# Patient Record
Sex: Male | Born: 1955 | Race: White | Hispanic: No | Smoking: Former smoker
Health system: Southern US, Community
[De-identification: ages and names within clinical notes are randomized; demographics above are authoritative.]

## PROBLEM LIST (undated history)

## (undated) DIAGNOSIS — D649 Anemia, unspecified: Secondary | ICD-10-CM

## (undated) DIAGNOSIS — M1990 Unspecified osteoarthritis, unspecified site: Secondary | ICD-10-CM

## (undated) DIAGNOSIS — R7989 Other specified abnormal findings of blood chemistry: Secondary | ICD-10-CM

## (undated) DIAGNOSIS — M4302 Spondylolysis, cervical region: Secondary | ICD-10-CM

## (undated) DIAGNOSIS — G118 Other hereditary ataxias: Secondary | ICD-10-CM

## (undated) DIAGNOSIS — K219 Gastro-esophageal reflux disease without esophagitis: Secondary | ICD-10-CM

## (undated) HISTORY — DX: Anemia, unspecified: D64.9

## (undated) HISTORY — DX: Spondylolysis, cervical region: M43.02

## (undated) HISTORY — DX: Other specified abnormal findings of blood chemistry: R79.89

## (undated) HISTORY — DX: Unspecified osteoarthritis, unspecified site: M19.90

## (undated) HISTORY — DX: Gastro-esophageal reflux disease without esophagitis: K21.9

## (undated) HISTORY — PX: OTHER SURGICAL HISTORY: SHX169

## (undated) HISTORY — PX: VASECTOMY: SHX75

---

## 2003-12-29 ENCOUNTER — Ambulatory Visit: Payer: Self-pay | Admitting: Anesthesiology

## 2004-01-22 HISTORY — PX: BACK SURGERY: SHX140

## 2004-02-29 ENCOUNTER — Ambulatory Visit: Payer: Self-pay | Admitting: Anesthesiology

## 2004-03-05 ENCOUNTER — Ambulatory Visit: Payer: Self-pay | Admitting: Anesthesiology

## 2004-03-27 ENCOUNTER — Ambulatory Visit: Payer: Self-pay | Admitting: Anesthesiology

## 2004-04-04 ENCOUNTER — Ambulatory Visit: Payer: Self-pay | Admitting: Anesthesiology

## 2004-06-04 ENCOUNTER — Ambulatory Visit: Payer: Self-pay | Admitting: Anesthesiology

## 2004-07-12 ENCOUNTER — Ambulatory Visit: Payer: Self-pay | Admitting: Anesthesiology

## 2004-07-23 ENCOUNTER — Ambulatory Visit (HOSPITAL_COMMUNITY): Admission: RE | Admit: 2004-07-23 | Discharge: 2004-07-23 | Payer: Self-pay | Admitting: Neurosurgery

## 2004-08-14 ENCOUNTER — Ambulatory Visit: Payer: Self-pay | Admitting: Anesthesiology

## 2004-09-12 ENCOUNTER — Encounter: Admission: RE | Admit: 2004-09-12 | Discharge: 2004-09-12 | Payer: Self-pay | Admitting: Neurosurgery

## 2004-09-17 ENCOUNTER — Ambulatory Visit: Payer: Self-pay | Admitting: Anesthesiology

## 2004-10-12 ENCOUNTER — Inpatient Hospital Stay (HOSPITAL_COMMUNITY): Admission: RE | Admit: 2004-10-12 | Discharge: 2004-10-16 | Payer: Self-pay | Admitting: Neurosurgery

## 2004-11-15 ENCOUNTER — Encounter: Admission: RE | Admit: 2004-11-15 | Discharge: 2004-11-15 | Payer: Self-pay | Admitting: Neurosurgery

## 2005-05-15 ENCOUNTER — Encounter: Admission: RE | Admit: 2005-05-15 | Discharge: 2005-05-15 | Payer: Self-pay | Admitting: Neurosurgery

## 2005-07-04 ENCOUNTER — Ambulatory Visit: Payer: Self-pay | Admitting: Unknown Physician Specialty

## 2005-08-20 ENCOUNTER — Ambulatory Visit: Payer: Self-pay | Admitting: Unknown Physician Specialty

## 2005-11-13 ENCOUNTER — Emergency Department: Payer: Self-pay | Admitting: Emergency Medicine

## 2005-12-30 IMAGING — CR DG CERVICAL SPINE 2 OR 3 VIEWS
3 series · 3 of 3 positions shown · non-contrast
Comparison: none

CLINICAL DATA: 48 year old with cervical fusion.
 CERVICAL SPINE, THREE VIEWS SERIES, INCLUDING NEUTRAL LATERAL, FLEXION AND EXTENSION VIEWS:
 Cervical fusion at C6-7 with anterior plate and screws and interbody bone plug.  Normal overall alignment.  No complicating features are demonstrated.  I do not see any evidence for instability or subluxation with flexion and extension.  C1-2 degenerative changes are noted.

[view not recorded (1 of 3)]
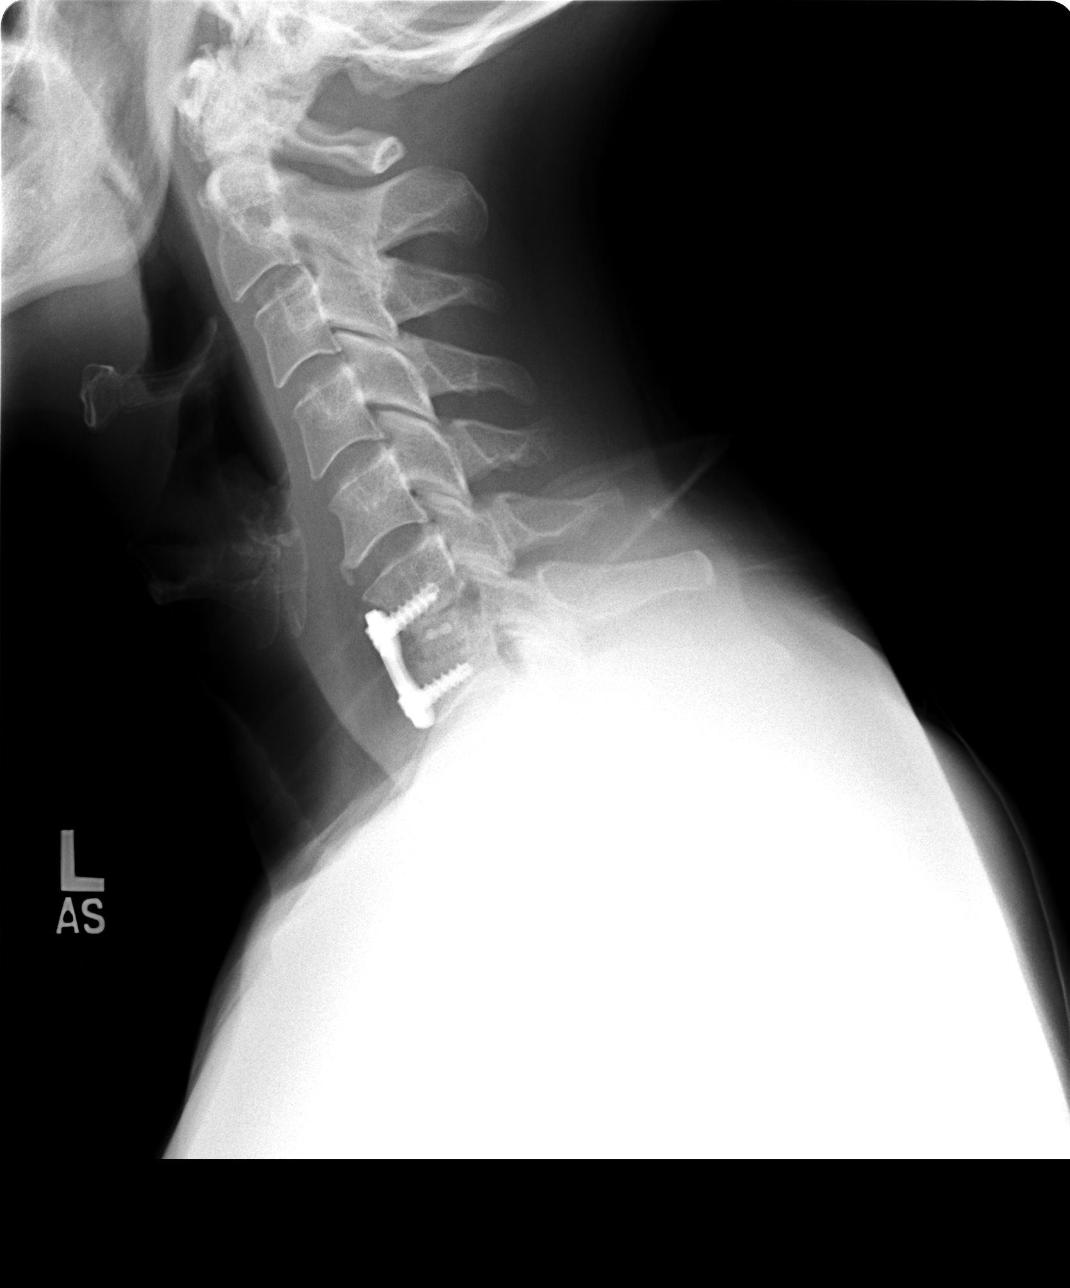

[view not recorded (2 of 3)]
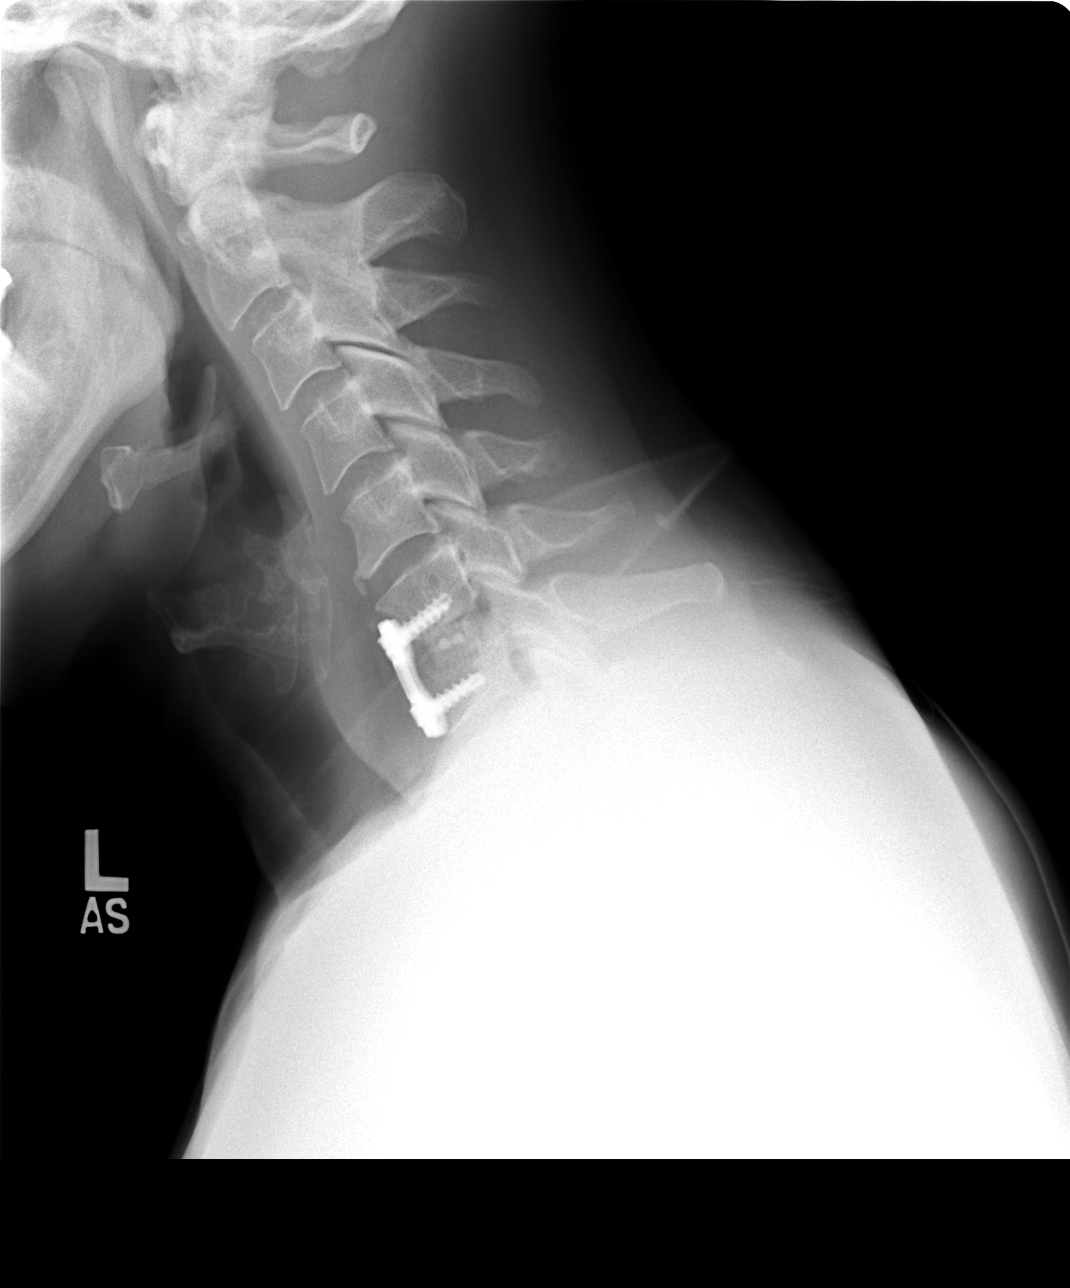

[view not recorded (3 of 3)]
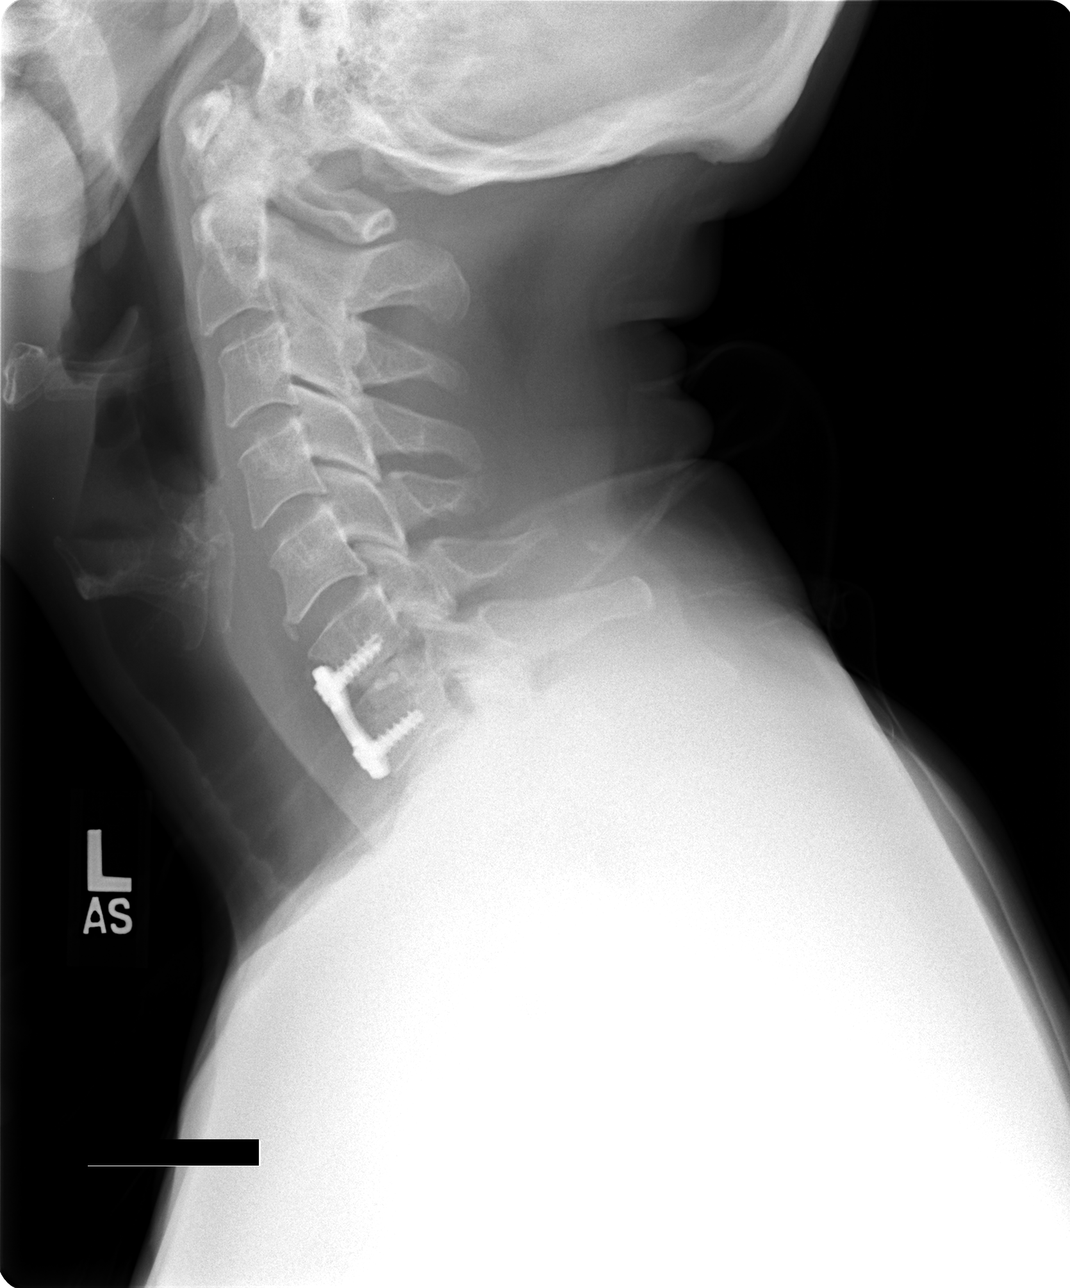

[3 of 3 positions shown; findings below may reference images not displayed]

IMPRESSION: 1.  C6-7 fusion without complicating features.
 2.  Normal alignment and no instability or subluxation with flexion and extension.

## 2006-01-29 IMAGING — RF DG LUMBAR SPINE 2-3V
1 series · 2 of 2 positions shown · non-contrast
Comparison: None.

CLINICAL DATA: L4-S1 fusion. 
 LUMBAR SPINE - 2 VIEW:

[Series 1: run · 2 of 2 slices shown]
[im 1/2]
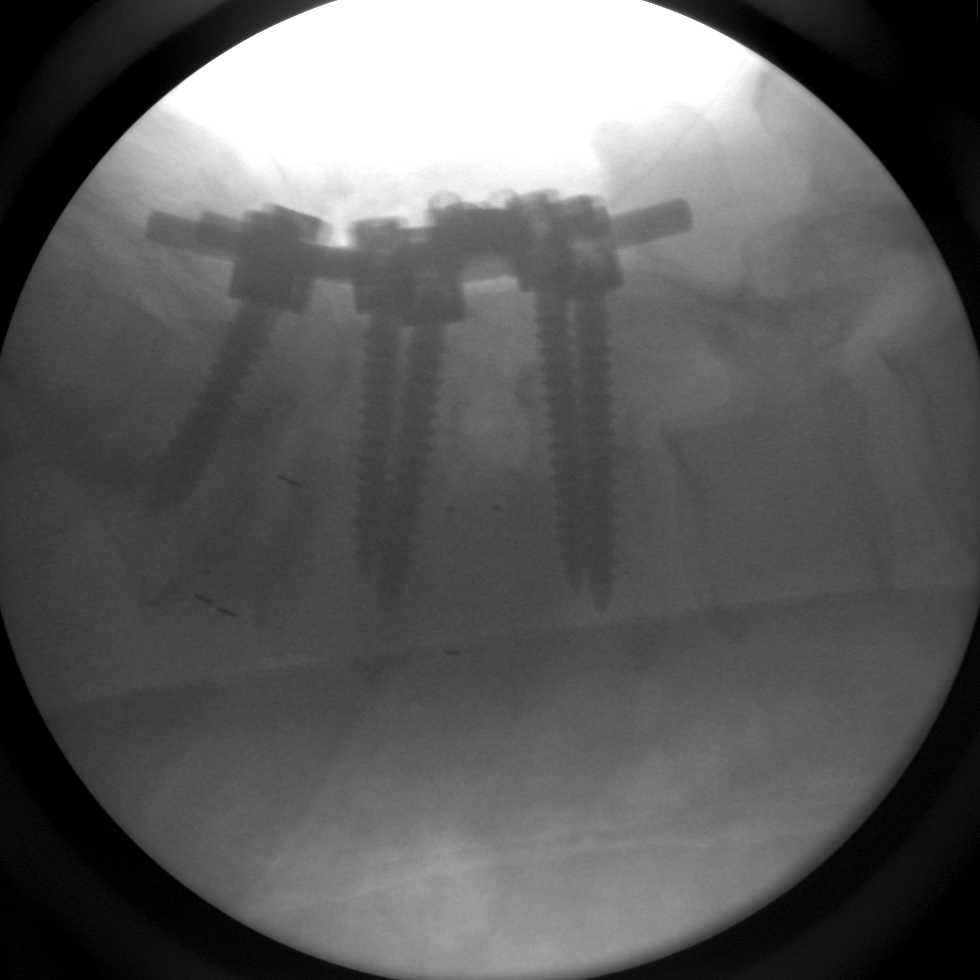
[im 2/2]
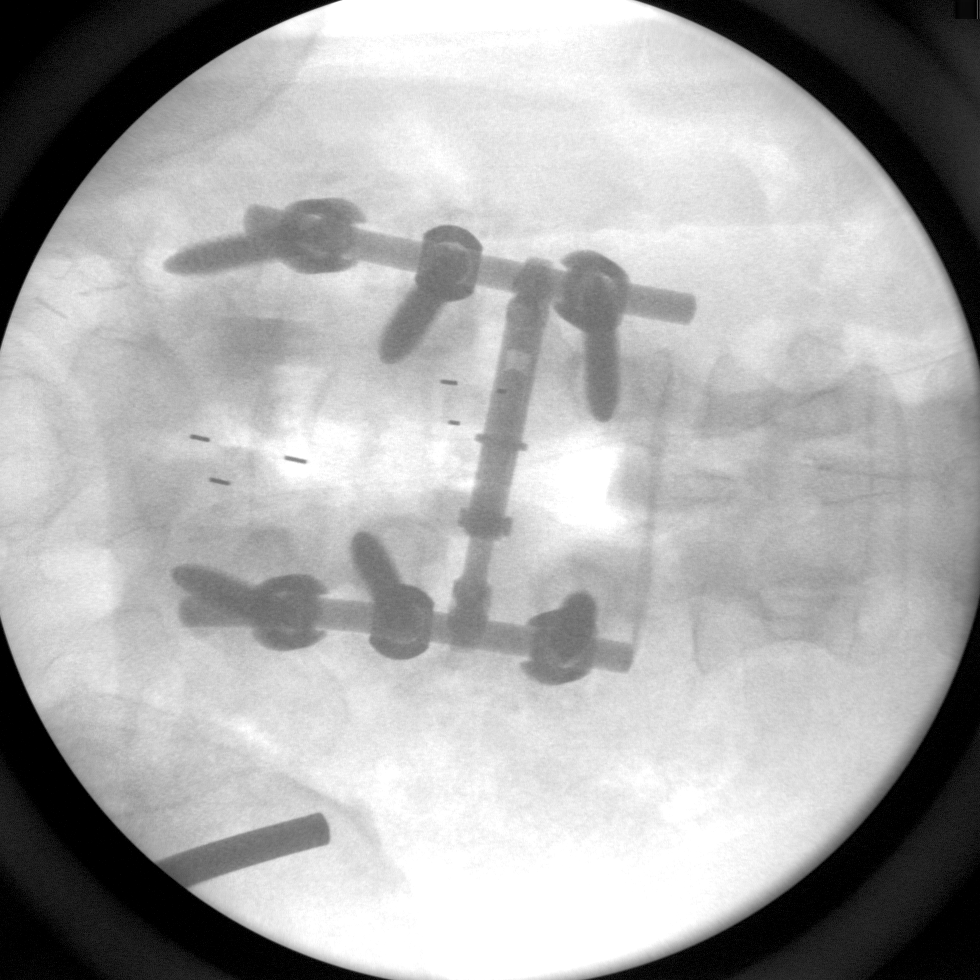

[2 of 2 positions shown; findings below may reference images not displayed]

FINDINGS: Two electronic spot images in the operating room show L4-S1 pedicle screws and rods and rods with intradiscal spacers.  Overall, good position and alignment.
IMPRESSION: Post fusion, L4-S1.

## 2006-03-04 IMAGING — CR DG LUMBAR SPINE 2-3V
2 series · 2 of 2 positions shown · non-contrast
Comparison: none

CLINICAL DATA: Surgery a month ago.  Posterior lumbar spine fusion.  Follow-up.
 LUMBAR SPINE ? TWO VIEWS:
 Two views of the lumbar spine are compared to intraoperative C-Arm spot films from 10/12/04.  Posterior fusion at L4-5 and L5-S1 appears stable.  The interbody fusion plugs are unchanged in position and normal alignment is maintained.  On the frontal view, there is a faint density noted along the right bony pelvis of uncertain significance overlying the right lateral urinary bladder.

[t l-spine a.p.]
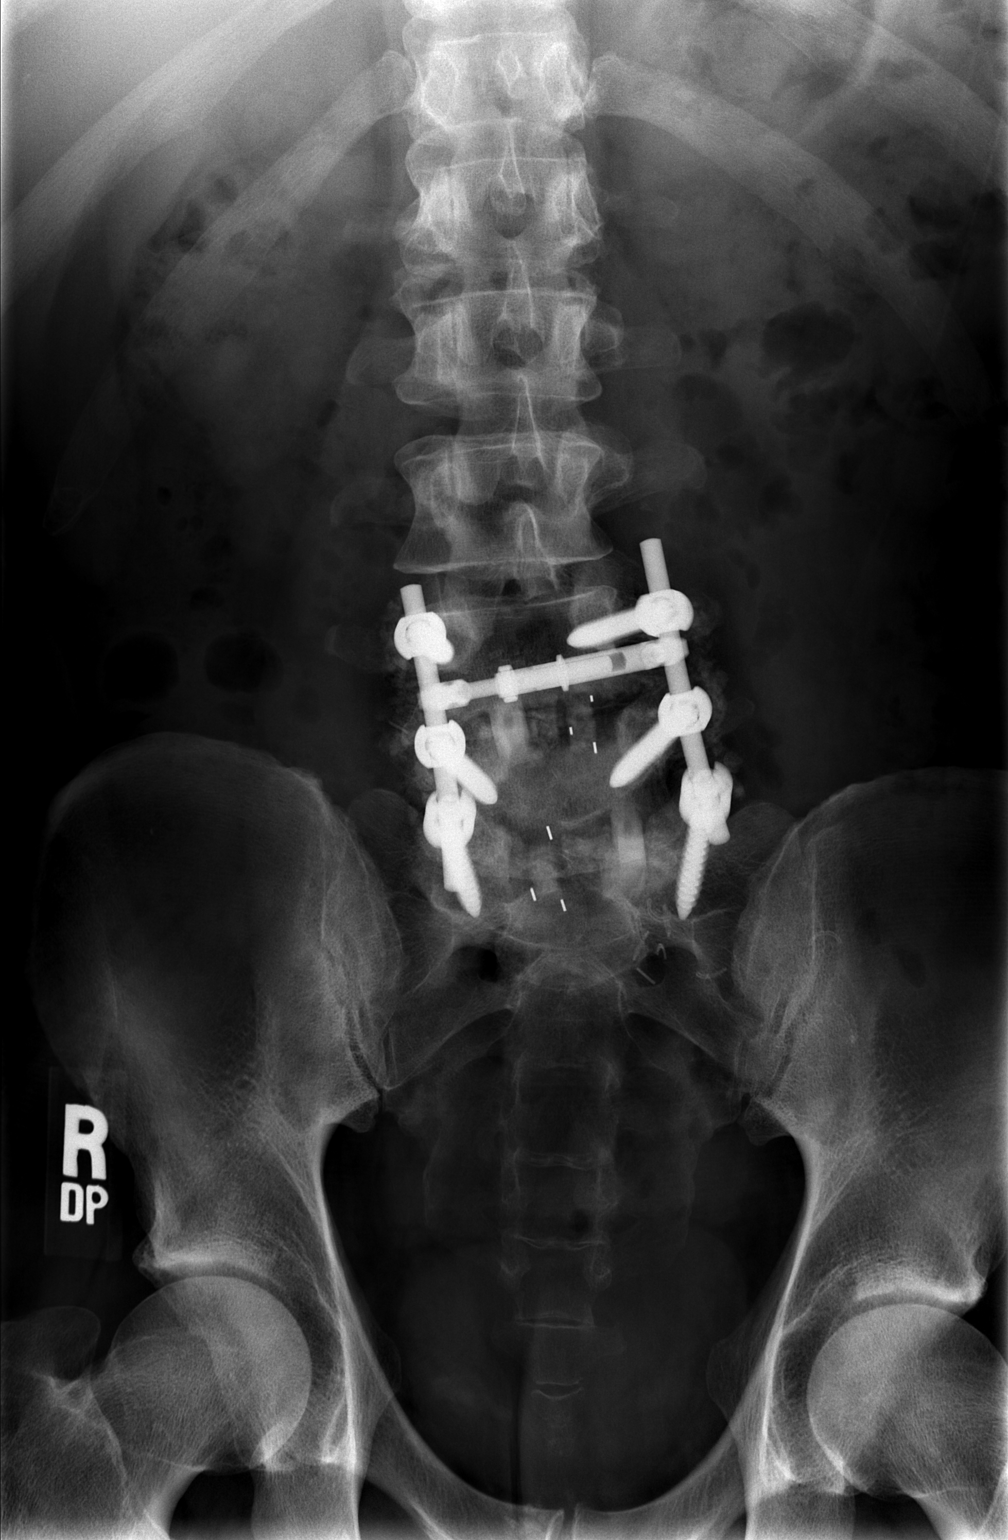

[t l-spine lat]
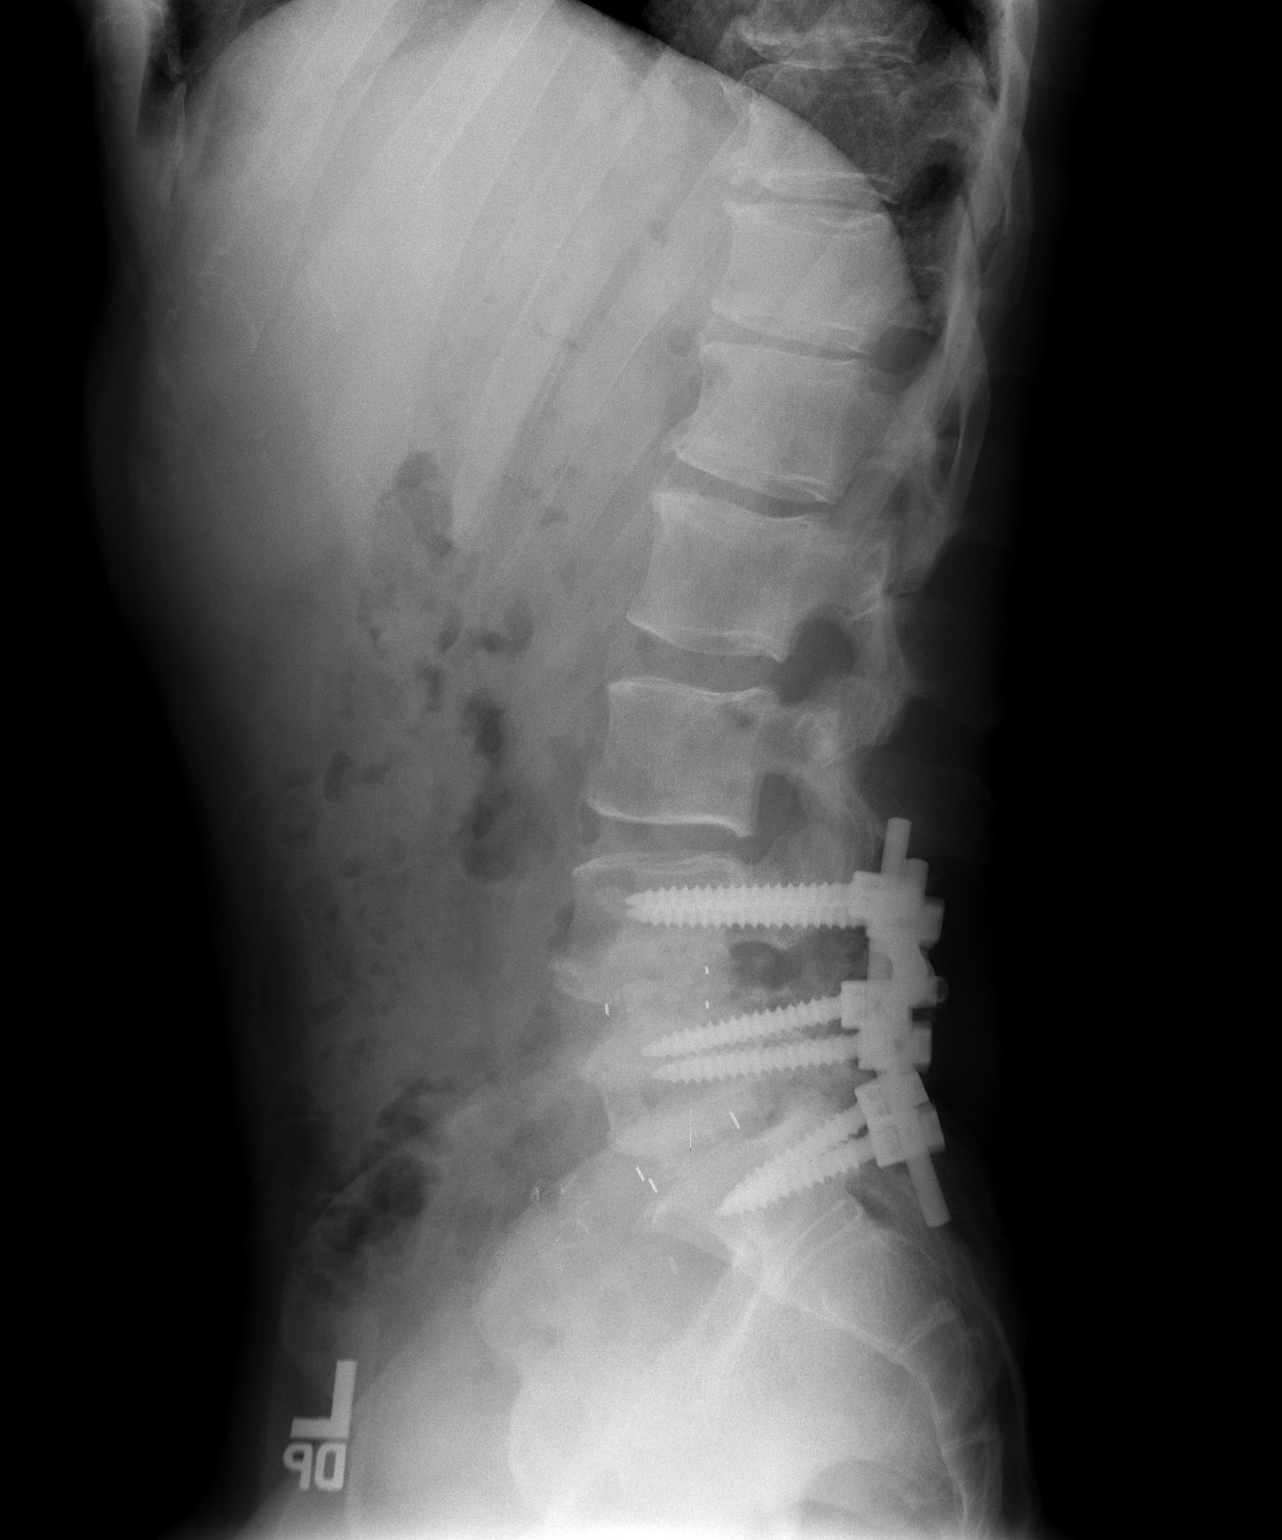

[2 of 2 positions shown; findings below may reference images not displayed]

IMPRESSION: 1.  Stable posterior fusion L4-5 and L5-S1. 
 2.  Faint density low in right bony pelvis of questionable significance.

## 2009-04-11 ENCOUNTER — Ambulatory Visit: Payer: Self-pay | Admitting: Neurosurgery

## 2010-05-10 ENCOUNTER — Ambulatory Visit: Payer: Self-pay | Admitting: Unknown Physician Specialty

## 2012-03-13 ENCOUNTER — Other Ambulatory Visit: Payer: Self-pay | Admitting: Neurosurgery

## 2012-03-13 ENCOUNTER — Ambulatory Visit
Admission: RE | Admit: 2012-03-13 | Discharge: 2012-03-13 | Disposition: A | Discharge status: Home / Self Care | Payer: Medicare Other | Source: Ambulatory Visit | Attending: Neurosurgery | Admitting: Neurosurgery

## 2012-03-13 ENCOUNTER — Other Ambulatory Visit (HOSPITAL_COMMUNITY): Payer: Self-pay | Admitting: Diagnostic Radiology

## 2012-03-13 DIAGNOSIS — M792 Neuralgia and neuritis, unspecified: Secondary | ICD-10-CM

## 2013-01-21 HISTORY — PX: NECK SURGERY: SHX720

## 2013-03-15 ENCOUNTER — Ambulatory Visit: Payer: Medicare Other | Admitting: Adult Health

## 2013-03-18 ENCOUNTER — Ambulatory Visit: Payer: Self-pay | Admitting: Adult Health

## 2013-04-08 ENCOUNTER — Ambulatory Visit: Payer: Self-pay | Admitting: Adult Health

## 2013-08-10 ENCOUNTER — Ambulatory Visit: Payer: Self-pay | Admitting: Adult Health

## 2014-02-08 DIAGNOSIS — M5416 Radiculopathy, lumbar region: Secondary | ICD-10-CM | POA: Diagnosis not present

## 2014-02-08 DIAGNOSIS — R030 Elevated blood-pressure reading, without diagnosis of hypertension: Secondary | ICD-10-CM | POA: Diagnosis not present

## 2014-02-24 DIAGNOSIS — M5137 Other intervertebral disc degeneration, lumbosacral region: Secondary | ICD-10-CM | POA: Diagnosis not present

## 2014-02-24 DIAGNOSIS — M5416 Radiculopathy, lumbar region: Secondary | ICD-10-CM | POA: Diagnosis not present

## 2014-02-24 DIAGNOSIS — R6889 Other general symptoms and signs: Secondary | ICD-10-CM | POA: Diagnosis not present

## 2014-02-24 DIAGNOSIS — M5126 Other intervertebral disc displacement, lumbar region: Secondary | ICD-10-CM | POA: Diagnosis not present

## 2014-03-23 DIAGNOSIS — M5416 Radiculopathy, lumbar region: Secondary | ICD-10-CM | POA: Diagnosis not present

## 2014-03-23 DIAGNOSIS — E291 Testicular hypofunction: Secondary | ICD-10-CM | POA: Diagnosis not present

## 2014-03-23 DIAGNOSIS — R6889 Other general symptoms and signs: Secondary | ICD-10-CM | POA: Diagnosis not present

## 2014-03-25 DIAGNOSIS — F5221 Male erectile disorder: Secondary | ICD-10-CM | POA: Diagnosis not present

## 2014-03-25 DIAGNOSIS — E291 Testicular hypofunction: Secondary | ICD-10-CM | POA: Diagnosis not present

## 2014-03-25 DIAGNOSIS — N400 Enlarged prostate without lower urinary tract symptoms: Secondary | ICD-10-CM | POA: Diagnosis not present

## 2014-07-04 ENCOUNTER — Ambulatory Visit (INDEPENDENT_AMBULATORY_CARE_PROVIDER_SITE_OTHER): Payer: Commercial Managed Care - HMO | Admitting: Family Medicine

## 2014-07-04 ENCOUNTER — Other Ambulatory Visit: Payer: Self-pay | Admitting: Family Medicine

## 2014-07-04 ENCOUNTER — Encounter: Payer: Self-pay | Admitting: Family Medicine

## 2014-07-04 VITALS — BP 100/58 | HR 86 | Resp 15 | Ht 73.0 in | Wt 178.2 lb

## 2014-07-04 DIAGNOSIS — R221 Localized swelling, mass and lump, neck: Secondary | ICD-10-CM | POA: Insufficient documentation

## 2014-07-04 DIAGNOSIS — K219 Gastro-esophageal reflux disease without esophagitis: Secondary | ICD-10-CM | POA: Diagnosis not present

## 2014-07-04 DIAGNOSIS — N529 Male erectile dysfunction, unspecified: Secondary | ICD-10-CM | POA: Insufficient documentation

## 2014-07-04 DIAGNOSIS — E349 Endocrine disorder, unspecified: Secondary | ICD-10-CM | POA: Insufficient documentation

## 2014-07-04 DIAGNOSIS — N400 Enlarged prostate without lower urinary tract symptoms: Secondary | ICD-10-CM | POA: Insufficient documentation

## 2014-07-04 DIAGNOSIS — E291 Testicular hypofunction: Secondary | ICD-10-CM

## 2014-07-04 MED ORDER — ANDROGEL PUMP 20.25 MG/ACT (1.62%) TD GEL: TRANSDERMAL | Status: DC

## 2014-07-04 MED ORDER — RANITIDINE HCL 150 MG PO TABS: 150.0000 mg | ORAL_TABLET | Freq: Two times a day (BID) | ORAL | Status: DC

## 2014-07-04 NOTE — Progress Notes (Signed)
Name: Larry Lin   MRN: [id number]    DOB: 04/25/1955   Date:07/04/2014       Progress Note  Subjective  Chief Complaint  Chief Complaint  Patient presents with  . Mass    Reported from patient in thraot for 8 weeks and has grown in size  . Hypogonadism    HPI  Pt. Is here for follow up of Hypogonadism. Last testosterone level was 465 in March 2016. Pt. continues on Andro gel 81mg  daily (4 pump actuations). Reports improvements in muscle mass, weight gain, endurance, and fatigue. No side effects reported. Last PSA was 0.1 in November 2015. Pt. also has a slowly enlarging, hard, painless, left side mass in his neck. First noticed 8 weeks ago.   Past Medical History  Diagnosis Date  . GERD (gastroesophageal reflux disease)     Past Surgical History  Procedure Laterality Date  . Back surgery  2006  . Neck surgery  2015    History reviewed. No pertinent family history.  History   Social History  . Marital Status: Divorced    Spouse Name: N/A  . Number of Children: N/A  . Years of Education: N/A   Occupational History  . Not on file.   Social History Main Topics  . Smoking status: Former Smoker    Quit date: 01/22/1987  . Smokeless tobacco: Never Used  . Alcohol Use: Not on file  . Drug Use: No  . Sexual Activity: Not on file   Other Topics Concern  . Not on file   Social History Narrative  . No narrative on file     Current outpatient prescriptions:  .  ascorbic acid (VITAMIN C) 100 MG tablet, Take 1 tablet by mouth daily., Disp: , Rfl:  .  Cyanocobalamin 100 MCG LOZG, Take 1 tablet by mouth daily., Disp: , Rfl:  .  HYDROcodone-acetaminophen (NORCO) 10-325 MG per tablet, Take 1 tablet by mouth 4 (four) times daily., Disp: , Rfl:  .  ranitidine (ZANTAC) 150 MG tablet, Take 1 tablet by mouth daily., Disp: , Rfl:  .  tadalafil (CIALIS) 5 MG tablet, Take 1 tablet by mouth as needed., Disp: , Rfl:  .  Testosterone (ANDROGEL PUMP) 20.25 MG/ACT (1.62%) GEL,  Place onto the skin., Disp: , Rfl:   Not on File   Review of Systems  Constitutional: Positive for malaise/fatigue. Negative for fever, chills and weight loss.  HENT: Negative for sore throat.   Eyes: Positive for blurred vision (vision coming in and out of focus especially when reading).  Respiratory: Negative for cough and hemoptysis.   Cardiovascular: Negative for chest pain.  Genitourinary: Negative for dysuria, urgency, frequency, hematuria and flank pain.      Objective  Filed Vitals:   07/04/14 1426  BP: 100/58  Pulse: 86  Resp: 15  Height: 6\' 1"  (1.854 m)  Weight: 178 lb 3.2 oz (80.831 kg)  SpO2: 96%    Physical Exam  Constitutional: He is oriented to person, place, and time and well-developed, well-nourished, and in no distress.  HENT:  Head: Normocephalic and atraumatic.  Neck: Neck supple.    Solid mass palpated over the left anterior neck area, mildly painful.  Cardiovascular: Normal rate and regular rhythm.   Pulmonary/Chest: Effort normal and breath sounds normal.  Neurological: He is alert and oriented to person, place, and time.  Nursing note and vitals reviewed.      No results found for this or any previous visit (from the past 2160  hour(s)).   Assessment & Plan 1. Localized swelling, mass and lump, neck We will obtain an ultrasound of the neck for evaluation of this mass. - CBC w/Diff - US Soft Tissue Head/Neck; Future  2. Testosterone deficiency  - PSA - Testosterone - ANDROGEL PUMP 20.25 MG/ACT (1.62%) GEL; 20.25mg /act. Apply two pumps to each shoulder qAM.  Dispense: 75 g; Refill: 0  3. Gastroesophageal reflux disease without esophagitis  - ranitidine (ZANTAC) 150 MG tablet; Take 1 tablet (150 mg total) by mouth 2 (two) times daily.  Dispense: 60 tablet; Refill: 5  There are no diagnoses linked to this encounter.  Brinley Treanor Asad [facility] 07/04/2014 3:04 PM

## 2014-07-05 ENCOUNTER — Other Ambulatory Visit: Payer: Self-pay | Admitting: Family Medicine

## 2014-07-05 DIAGNOSIS — E349 Endocrine disorder, unspecified: Secondary | ICD-10-CM

## 2014-07-05 MED ORDER — TESTOSTERONE 20.25 MG/1.25GM (1.62%) TD GEL: TRANSDERMAL | Status: DC

## 2014-07-07 ENCOUNTER — Ambulatory Visit
Admission: RE | Admit: 2014-07-07 | Discharge: 2014-07-07 | Disposition: A | Discharge status: Home / Self Care | Payer: Commercial Managed Care - HMO | Source: Ambulatory Visit | Attending: Family Medicine | Admitting: Family Medicine

## 2014-07-07 DIAGNOSIS — R221 Localized swelling, mass and lump, neck: Secondary | ICD-10-CM | POA: Diagnosis not present

## 2014-07-12 DIAGNOSIS — E291 Testicular hypofunction: Secondary | ICD-10-CM | POA: Diagnosis not present

## 2014-07-12 DIAGNOSIS — R221 Localized swelling, mass and lump, neck: Secondary | ICD-10-CM | POA: Diagnosis not present

## 2014-07-13 ENCOUNTER — Telehealth: Payer: Self-pay | Admitting: Family Medicine

## 2014-07-13 DIAGNOSIS — M5441 Lumbago with sciatica, right side: Secondary | ICD-10-CM

## 2014-07-13 DIAGNOSIS — N400 Enlarged prostate without lower urinary tract symptoms: Secondary | ICD-10-CM | POA: Insufficient documentation

## 2014-07-13 LAB — PSA: Prostate Specific Ag, Serum: 0.2 ng/mL (ref 0.0–4.0)

## 2014-07-13 LAB — CBC WITH DIFFERENTIAL/PLATELET
BASOS ABS: 0 10*3/uL (ref 0.0–0.2)
Basos: 0 %
EOS (ABSOLUTE): 0.1 10*3/uL (ref 0.0–0.4)
Eos: 1 %
HEMOGLOBIN: 14.8 g/dL (ref 12.6–17.7)
Hematocrit: 44.9 % (ref 37.5–51.0)
IMMATURE GRANS (ABS): 0 10*3/uL (ref 0.0–0.1)
IMMATURE GRANULOCYTES: 0 %
LYMPHS: 33 %
Lymphocytes Absolute: 2.4 10*3/uL (ref 0.7–3.1)
MCH: 30.5 pg (ref 26.6–33.0)
MCHC: 33 g/dL (ref 31.5–35.7)
MCV: 93 fL (ref 79–97)
MONOCYTES: 9 %
Monocytes Absolute: 0.6 10*3/uL (ref 0.1–0.9)
Neutrophils Absolute: 4.3 10*3/uL (ref 1.4–7.0)
Neutrophils: 57 %
Platelets: 209 10*3/uL (ref 150–379)
RBC: 4.85 x10E6/uL (ref 4.14–5.80)
RDW: 14 % (ref 12.3–15.4)
WBC: 7.4 10*3/uL (ref 3.4–10.8)

## 2014-07-13 LAB — TESTOSTERONE: Testosterone: 598 ng/dL (ref 348–1197)

## 2014-07-13 NOTE — Telephone Encounter (Signed)
ERRENOUS °

## 2014-07-13 NOTE — Telephone Encounter (Signed)
Here is a patient request for referral

## 2014-07-13 NOTE — Telephone Encounter (Signed)
Requesting a Urology referral appointment to be sent to Dr. Lelon Perla in [place]. Pt currently has [facility]. Please call when complete. [phone]

## 2014-07-19 NOTE — Progress Notes (Signed)
Called Pt, LM of normal

## 2014-07-19 NOTE — Telephone Encounter (Signed)
Here's a referral.

## 2014-09-16 DIAGNOSIS — M47812 Spondylosis without myelopathy or radiculopathy, cervical region: Secondary | ICD-10-CM | POA: Diagnosis not present

## 2014-10-04 ENCOUNTER — Ambulatory Visit: Payer: Commercial Managed Care - HMO | Admitting: Family Medicine

## 2014-10-07 ENCOUNTER — Ambulatory Visit: Payer: Commercial Managed Care - HMO | Admitting: Family Medicine

## 2014-10-19 ENCOUNTER — Telehealth: Payer: Self-pay | Admitting: Family Medicine

## 2014-10-19 ENCOUNTER — Ambulatory Visit: Payer: Commercial Managed Care - HMO | Admitting: Family Medicine

## 2014-10-19 NOTE — Telephone Encounter (Signed)
Routed to Dr. Shah for Referral °

## 2014-10-19 NOTE — Telephone Encounter (Signed)
PT SAID THAT BACK IN June THAT THE NURSE WAS TO SET HIM UP AN APPT FOR ENT AND THIS HAS NOT BEEN DONE YET. PLEASE ADVISE

## 2014-10-21 NOTE — Telephone Encounter (Signed)
Tried to call patient and the number listed on his chart is incorrect. Please have patient call us back to discuss the ENT referral

## 2014-10-24 NOTE — Telephone Encounter (Signed)
Per Dr. Sherryll Burger he has Tried to call patient and the number listed on his chart is incorrect. Please have patient call us back to discuss the ENT referral, i tried to call patient unable to reach him

## 2014-10-25 ENCOUNTER — Telehealth: Payer: Self-pay | Admitting: Family Medicine

## 2014-10-25 NOTE — Telephone Encounter (Signed)
Pt has an appt tomorrow at [facility] ENT Dr Buckner Malta for tomorrow 10/26/14 and he has [facility] and needs a referral to be completed.

## 2014-10-25 NOTE — Telephone Encounter (Signed)
Spoke with patient and verified what is going to be seen for, he stated it was for his mass on his throat. Obtained [employer] authorization.  Auth # [id number] Dr. Marion Downer ICD-10: R22.1 Start - 10/26/2014 Expires - 04/24/2015

## 2014-10-25 NOTE — Telephone Encounter (Signed)
Called patient and had to leave a message for him to call me back, I need to know what he is going to been seen for before I can do a referral to ENT as it needs to be specific.

## 2014-10-26 ENCOUNTER — Ambulatory Visit (INDEPENDENT_AMBULATORY_CARE_PROVIDER_SITE_OTHER): Payer: Commercial Managed Care - HMO | Admitting: Family Medicine

## 2014-10-26 ENCOUNTER — Encounter: Payer: Self-pay | Admitting: Family Medicine

## 2014-10-26 VITALS — BP 129/78 | HR 72 | Temp 98.1°F | Resp 16 | Ht 73.0 in | Wt 177.2 lb

## 2014-10-26 DIAGNOSIS — N528 Other male erectile dysfunction: Secondary | ICD-10-CM | POA: Diagnosis not present

## 2014-10-26 DIAGNOSIS — E739 Lactose intolerance, unspecified: Secondary | ICD-10-CM | POA: Diagnosis not present

## 2014-10-26 DIAGNOSIS — E291 Testicular hypofunction: Secondary | ICD-10-CM

## 2014-10-26 DIAGNOSIS — E349 Endocrine disorder, unspecified: Secondary | ICD-10-CM

## 2014-10-26 DIAGNOSIS — R221 Localized swelling, mass and lump, neck: Secondary | ICD-10-CM | POA: Diagnosis not present

## 2014-10-26 DIAGNOSIS — N400 Enlarged prostate without lower urinary tract symptoms: Secondary | ICD-10-CM | POA: Diagnosis not present

## 2014-10-26 DIAGNOSIS — K9049 Malabsorption due to intolerance, not elsewhere classified: Secondary | ICD-10-CM

## 2014-10-26 DIAGNOSIS — R6889 Other general symptoms and signs: Secondary | ICD-10-CM | POA: Diagnosis not present

## 2014-10-26 DIAGNOSIS — N529 Male erectile dysfunction, unspecified: Secondary | ICD-10-CM

## 2014-10-26 MED ORDER — SILDENAFIL CITRATE 100 MG PO TABS: 50.0000 mg | ORAL_TABLET | Freq: Every day | ORAL | Status: DC | PRN

## 2014-10-26 MED ORDER — ANDROGEL PUMP 20.25 MG/ACT (1.62%) TD GEL: TRANSDERMAL | Status: DC

## 2014-10-26 NOTE — Progress Notes (Signed)
Name: Larry Lin   MRN: [id number]    DOB: 17-Aug-1955   Date:10/26/2014       Progress Note  Subjective  Chief Complaint  Chief Complaint  Patient presents with  . Gastrophageal Reflux  . Erectile Dysfunction    pt here for 3 month follow up and refills  . Benign Prostatic Hypertrophy   HPI  Testosterone deficiency Pt. With follow up of decreased testosterone levels. Currently on Androgel, which seems to be helping. He has good energy level, libido is 'little less than normal'. No side effects noted.  Pt. Also has history of Erectile Dysfunction and BPH and takes Cialis (has not taken recently).  Carbohydrate Intolerance Patient describes symptoms of feeling high in energy after ingesting a sugar which meal followed by a 'crash' later on. Patient has no history of diabetes. He would like to have his sugar level checked  Past Medical History  Diagnosis Date  . GERD (gastroesophageal reflux disease)     Past Surgical History  Procedure Laterality Date  . Back surgery  2006  . Neck surgery  2015    No family history on file.  Social History   Social History  . Marital Status: Divorced    Spouse Name: N/A  . Number of Children: N/A  . Years of Education: N/A   Occupational History  . Not on file.   Social History Main Topics  . Smoking status: Former Smoker    Quit date: 01/22/1987  . Smokeless tobacco: Never Used  . Alcohol Use: Not on file  . Drug Use: No  . Sexual Activity: Not on file   Other Topics Concern  . Not on file   Social History Narrative     Current outpatient prescriptions:  .  ALPRAZolam (XANAX) 0.25 MG tablet, TK 1 T PO BID, Disp: , Rfl: 0 .  ANDROGEL PUMP 20.25 MG/ACT (1.62%) GEL, 20.25mg /act. Apply two pumps to each shoulder qAM., Disp: 75 g, Rfl: 0 .  ascorbic acid (VITAMIN C) 100 MG tablet, Take 1 tablet by mouth daily., Disp: , Rfl:  .  Cyanocobalamin 100 MCG LOZG, Take 1 tablet by mouth daily., Disp: , Rfl:  .   HYDROcodone-acetaminophen (NORCO) 10-325 MG per tablet, Take 1 tablet by mouth 4 (four) times daily., Disp: , Rfl:  .  oxyCODONE-acetaminophen (PERCOCET) 10-325 MG tablet, TK 1 TO 2 TS PO Q 6 H PRN, Disp: , Rfl: 0 .  ranitidine (ZANTAC) 150 MG tablet, Take 1 tablet (150 mg total) by mouth 2 (two) times daily., Disp: 60 tablet, Rfl: 5 .  tadalafil (CIALIS) 5 MG tablet, Take 1 tablet by mouth as needed., Disp: , Rfl:  .  Testosterone (ANDROGEL) 20.25 MG/1.25GM (1.62%) GEL, Apply to pumps to each shoulder every morning, Disp: 150 g, Rfl: 2  No Known Allergies   Review of Systems  Genitourinary: Positive for frequency. Negative for dysuria and urgency.   Objective  Filed Vitals:   10/26/14 1153  BP: 129/78  Pulse: 72  Temp: 98.1 F (36.7 C)  Resp: 16  Height:  (1.854 m)  Weight: 177 lb 3 oz (80.372 kg)  SpO2: 97%    Physical Exam  Constitutional: He is well-developed, well-nourished, and in no distress.  Cardiovascular: Normal rate and regular rhythm.   Pulmonary/Chest: Effort normal and breath sounds normal.  Abdominal: Soft. Bowel sounds are normal.  Nursing note and vitals reviewed.  Assessment & Plan  1. Benign prostatic hypertrophy without urinary obstruction Patient will schedule  a follow-up appointment in 2 weeks for DRE and consideration of pharmacotherapy.  2. ED (erectile dysfunction) of organic origin Patient unable to afford Cialis so will switch to Viagra 50 mg  As needed. - sildenafil (VIAGRA) 100 MG tablet; Take 0.5 tablets (50 mg total) by mouth daily as needed for erectile dysfunction.  Dispense: 6 tablet; Refill: 2  3. Testosterone deficiency Recheck testosterone levels in 3 months. Continue present therapy. - ANDROGEL PUMP 20.25 MG/ACT (1.62%) GEL; 20.25mg /act. Apply two pumps to each shoulder qAM.  Dispense: 75 g; Refill: 0  4. Carbohydrate intolerance  - Comprehensive Metabolic Panel (CMET) - HgB A1c   Larry Lin [facility] 10/26/2014 12:59 PM

## 2014-11-04 ENCOUNTER — Telehealth: Payer: Self-pay | Admitting: Family Medicine

## 2014-11-04 NOTE — Telephone Encounter (Signed)
Pt needs refill on Androgel to be [facility] to [facility] [place] phone # [phone].

## 2014-11-07 NOTE — Telephone Encounter (Signed)
This medication has just been refilled on 10/26/2014 by Dr. Sherryll BurgerShah it is too early for refill on this medication

## 2014-11-09 NOTE — Telephone Encounter (Signed)
Pt would like for you to call him back so he can explain this RX

## 2014-11-10 ENCOUNTER — Telehealth: Payer: Self-pay

## 2014-11-10 DIAGNOSIS — E349 Endocrine disorder, unspecified: Secondary | ICD-10-CM

## 2014-11-10 MED ORDER — TESTOSTERONE 20.25 MG/1.25GM (1.62%) TD GEL: TRANSDERMAL | Status: DC

## 2014-11-10 NOTE — Telephone Encounter (Signed)
Rx for AndroGel is ready for pickup. This is a controlled substance and cannot be called in to an out-of-state pharmacy.

## 2014-11-10 NOTE — Telephone Encounter (Signed)
Patient called wanting a new rx to be submitted for his Androgel. He stated that it was written for 75g and he it needed to be 150mg  in order for his insurance to cover it. I informed him that I will consult with Dr. Sherryll BurgerShah and that we will take it from there. He stated that he needed [facility] to [facility] [address] [place], [place] Their phone is [phone] .

## 2014-11-14 ENCOUNTER — Telehealth: Payer: Self-pay

## 2014-11-14 NOTE — Telephone Encounter (Signed)
Tried to contact this patient to inform him that we could not call in controlled substance and that he would have to pick this rx up, but there was no answer. A message was left for him to give us a call when he got the chance.

## 2014-11-14 NOTE — Telephone Encounter (Signed)
Patient called stating that his pharmacy said it was ok for us to fax his rx to them. The number they gave him is [phone].   rx was faxed and confirmation was received.

## 2014-11-25 DIAGNOSIS — E739 Lactose intolerance, unspecified: Secondary | ICD-10-CM | POA: Diagnosis not present

## 2014-11-26 LAB — COMPREHENSIVE METABOLIC PANEL
ALT: 17 IU/L (ref 0–44)
AST: 23 IU/L (ref 0–40)
Albumin/Globulin Ratio: 1.9 (ref 1.1–2.5)
Albumin: 4.9 g/dL (ref 3.5–5.5)
Alkaline Phosphatase: 46 IU/L (ref 39–117)
BILIRUBIN TOTAL: 0.6 mg/dL (ref 0.0–1.2)
BUN/Creatinine Ratio: 17 (ref 9–20)
BUN: 17 mg/dL (ref 6–24)
CO2: 27 mmol/L (ref 18–29)
Calcium: 10.2 mg/dL (ref 8.7–10.2)
Chloride: 102 mmol/L (ref 97–106)
Creatinine, Ser: 1.01 mg/dL (ref 0.76–1.27)
GFR calc Af Amer: 94 mL/min/{1.73_m2} (ref >59)
GFR calc non Af Amer: 82 mL/min/{1.73_m2} (ref >59)
Globulin, Total: 2.6 g/dL (ref 1.5–4.5)
Glucose: 77 mg/dL (ref 65–99)
Potassium: 4.8 mmol/L (ref 3.5–5.2)
Sodium: 145 mmol/L — ABNORMAL HIGH (ref 136–144)
TOTAL PROTEIN: 7.5 g/dL (ref 6.0–8.5)

## 2014-11-26 LAB — HEMOGLOBIN A1C
ESTIMATED AVERAGE GLUCOSE: 111 mg/dL
Hgb A1c MFr Bld: 5.5 % (ref 4.8–5.6)

## 2015-01-11 DIAGNOSIS — R6889 Other general symptoms and signs: Secondary | ICD-10-CM | POA: Diagnosis not present

## 2015-02-03 ENCOUNTER — Other Ambulatory Visit: Payer: Self-pay | Admitting: Family Medicine

## 2015-02-03 NOTE — Telephone Encounter (Signed)
Routed to Dr. Shah for approval 

## 2015-02-03 NOTE — Telephone Encounter (Signed)
Pt needs refill on Androgel and Cialis to be sent to [facility] [place].

## 2015-02-07 ENCOUNTER — Telehealth: Payer: Self-pay

## 2015-02-07 NOTE — Telephone Encounter (Signed)
Returned call and left a voice message. Patient must return to obtain testosterone levels before any further refills can be given. Please schedule for an appointment.

## 2015-02-07 NOTE — Telephone Encounter (Signed)
Patient is requesting refills on Androgel and Cialis he states that he works out of town and is currently traveling now

## 2015-02-14 ENCOUNTER — Telehealth: Payer: Self-pay

## 2015-02-14 DIAGNOSIS — K219 Gastro-esophageal reflux disease without esophagitis: Secondary | ICD-10-CM

## 2015-02-14 MED ORDER — RANITIDINE HCL 150 MG PO TABS: 150.0000 mg | ORAL_TABLET | Freq: Two times a day (BID) | ORAL | Status: DC

## 2015-02-14 NOTE — Telephone Encounter (Signed)
Medication has been refilled and sent to [facility] [address] 

## 2015-03-01 ENCOUNTER — Telehealth: Payer: Self-pay

## 2015-03-01 NOTE — Telephone Encounter (Signed)
Patient called asking I place a referral for him to [facility] with Dr. Julio Sicks. He has an appt on 03/08/15. Authorization obtained Auth# [id number] ICD-10: M 54.41 Start - 03/01/15 Expires- 08/28/15 6 visits

## 2015-03-07 ENCOUNTER — Encounter: Payer: Self-pay | Admitting: Family Medicine

## 2015-03-07 ENCOUNTER — Ambulatory Visit (INDEPENDENT_AMBULATORY_CARE_PROVIDER_SITE_OTHER): Payer: Commercial Managed Care - HMO | Admitting: Family Medicine

## 2015-03-07 ENCOUNTER — Telehealth: Payer: Self-pay

## 2015-03-07 VITALS — BP 124/71 | HR 101 | Temp 98.0°F | Resp 20 | Ht 73.0 in | Wt 175.0 lb

## 2015-03-07 DIAGNOSIS — E349 Endocrine disorder, unspecified: Secondary | ICD-10-CM

## 2015-03-07 DIAGNOSIS — R1031 Right lower quadrant pain: Secondary | ICD-10-CM

## 2015-03-07 DIAGNOSIS — N528 Other male erectile dysfunction: Secondary | ICD-10-CM

## 2015-03-07 DIAGNOSIS — N529 Male erectile dysfunction, unspecified: Secondary | ICD-10-CM

## 2015-03-07 DIAGNOSIS — M5441 Lumbago with sciatica, right side: Secondary | ICD-10-CM | POA: Diagnosis not present

## 2015-03-07 DIAGNOSIS — E291 Testicular hypofunction: Secondary | ICD-10-CM | POA: Diagnosis not present

## 2015-03-07 DIAGNOSIS — K219 Gastro-esophageal reflux disease without esophagitis: Secondary | ICD-10-CM

## 2015-03-07 MED ORDER — ANDROGEL PUMP 20.25 MG/ACT (1.62%) TD GEL: TRANSDERMAL | Status: DC

## 2015-03-07 MED ORDER — TADALAFIL 5 MG PO TABS: 5.0000 mg | ORAL_TABLET | Freq: Every day | ORAL | Status: DC | PRN

## 2015-03-07 MED ORDER — RANITIDINE HCL 150 MG PO TABS: 150.0000 mg | ORAL_TABLET | Freq: Two times a day (BID) | ORAL | Status: DC

## 2015-03-07 NOTE — Telephone Encounter (Signed)
Prescription has been refilled and patient has received prescription at office visit on 03/07/2015

## 2015-03-07 NOTE — Progress Notes (Signed)
Name: Larry Lin   MRN: [id number]    DOB: 1955/07/15   Date:03/07/2015       Progress Note  Subjective  Chief Complaint  Chief Complaint  Patient presents with  . Medication Refill    androgel 20.25 mcg / ranitidine 150 mg / cialis 5 mg    Erectile Dysfunction This is a chronic problem. The nature of his difficulty is maintaining erection. Irritative symptoms do not include urgency. Obstructive symptoms do not include dribbling, incomplete emptying or a slower stream. Pertinent negatives include no hematuria. Past treatments include tadalafil.  Gastroesophageal Reflux He reports no abdominal pain, no belching, no dysphagia, no heartburn, no nausea or no sore throat. This is a chronic problem. The symptoms are aggravated by certain foods (spicy foods, greasy foods.). He has tried a histamine-2 antagonist for the symptoms. History of esophageal stretching.Marland Kitchen    Referral To Neurosurgery Patient is requesting an updated referral to Neurosurgery, sees Dr. Dutch Quint for follow up after having lower back surgery in 2006 for radicular low back pain. He sees Dr. Dutch Quint 3 times a year.  Low testosterone Pt. Requesting refill for Androgel 1.62%. He has history of Low testosterone, last level obtained in June 2016 was within normal range. Will repeat levels today.  Past Medical History  Diagnosis Date  . GERD (gastroesophageal reflux disease)     Past Surgical History  Procedure Laterality Date  . Back surgery  2006  . Neck surgery  2015    History reviewed. No pertinent family history.  Social History   Social History  . Marital Status: Divorced    Spouse Name: N/A  . Number of Children: N/A  . Years of Education: N/A   Occupational History  . Not on file.   Social History Main Topics  . Smoking status: Former Smoker    Quit date: 01/22/1987  . Smokeless tobacco: Never Used  . Alcohol Use: Not on file  . Drug Use: No  . Sexual Activity: Not on file   Other Topics Concern   . Not on file   Social History Narrative     Current outpatient prescriptions:  .  ALPRAZolam (XANAX) 0.25 MG tablet, TK 1 T PO BID, Disp: , Rfl: 0 .  ANDROGEL PUMP 20.25 MG/ACT (1.62%) GEL, 20.25mg /act. Apply two pumps to each shoulder qAM., Disp: 75 g, Rfl: 0 .  ascorbic acid (VITAMIN C) 100 MG tablet, Take 1 tablet by mouth daily., Disp: , Rfl:  .  Cyanocobalamin 100 MCG LOZG, Take 1 tablet by mouth daily., Disp: , Rfl:  .  HYDROcodone-acetaminophen (NORCO) 10-325 MG per tablet, Take 1 tablet by mouth 4 (four) times daily. Reported on 03/07/2015, Disp: , Rfl:  .  oxyCODONE-acetaminophen (PERCOCET) 10-325 MG tablet, TK 1 TO 2 TS PO Q 6 H PRN, Disp: , Rfl: 0 .  ranitidine (ZANTAC) 150 MG tablet, Take 1 tablet (150 mg total) by mouth 2 (two) times daily., Disp: 60 tablet, Rfl: 0 .  tadalafil (CIALIS) 5 MG tablet, Take 1 tablet by mouth as needed., Disp: , Rfl:  .  Testosterone (ANDROGEL) 20.25 MG/1.25GM (1.62%) GEL, Apply two pumps to each shoulder every morning, Disp: 150 g, Rfl: 2 .  sildenafil (VIAGRA) 100 MG tablet, Take 0.5 tablets (50 mg total) by mouth daily as needed for erectile dysfunction. (Patient not taking: Reported on 03/07/2015), Disp: 6 tablet, Rfl: 2  No Known Allergies   Review of Systems  HENT: Negative for sore throat.   Gastrointestinal: Negative for  heartburn, dysphagia, nausea, vomiting and abdominal pain.  Genitourinary: Negative for urgency, hematuria and incomplete emptying.  Musculoskeletal: Positive for back pain.    Objective  Filed Vitals:   03/07/15 1347  BP: 124/71  Pulse: 101  Temp: 98 F (36.7 C)  TempSrc: Oral  Resp: 20  Height:  (1.854 m)  Weight: 175 lb (79.379 kg)  SpO2: 95%    Physical Exam  Constitutional: He is oriented to person, place, and time and well-developed, well-nourished, and in no distress.  Cardiovascular: Normal rate and regular rhythm.   Pulmonary/Chest: Effort normal and breath sounds normal.  Abdominal:  Soft. Bowel sounds are normal. There is tenderness (mild tenderness to palpation in the RLQ to supra-pubic area,).  Neurological: He is alert and oriented to person, place, and time.  Nursing note and vitals reviewed.    Assessment & Plan  1. Gastroesophageal reflux disease without esophagitis Stable on Zantac 150 mg twice a day. - ranitidine (ZANTAC) 150 MG tablet; Take 1 tablet (150 mg total) by mouth 2 (two) times daily.  Dispense: 60 tablet; Refill: 5  2. Testosterone deficiency We will obtain testosterone level and PSA, refill for AndroGel provided. - PSA - Testosterone  3. 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 ED (erectile dysfunction) of organic origin Continue on Cialis 5 mg as needed. - tadalafil (CIALIS) 5 MG tablet; Take 1 tablet (5 mg total) by mouth daily as needed.  Dispense: 10 tablet; Refill: 5  5. RLQ abdominal pain No associated concerning symptoms. Pain is intermittent. Advised to follow-up if pain gets worse or have other concerning symptoms.    Claborn Janusz Asad [facility] 03/07/2015 2:00 PM

## 2015-03-08 DIAGNOSIS — R6889 Other general symptoms and signs: Secondary | ICD-10-CM | POA: Diagnosis not present

## 2015-03-08 DIAGNOSIS — M47812 Spondylosis without myelopathy or radiculopathy, cervical region: Secondary | ICD-10-CM | POA: Diagnosis not present

## 2015-03-13 ENCOUNTER — Other Ambulatory Visit (HOSPITAL_COMMUNITY): Payer: Self-pay | Admitting: Neurosurgery

## 2015-03-13 DIAGNOSIS — M47812 Spondylosis without myelopathy or radiculopathy, cervical region: Secondary | ICD-10-CM

## 2015-04-04 ENCOUNTER — Ambulatory Visit: Payer: Commercial Managed Care - HMO

## 2015-04-21 ENCOUNTER — Ambulatory Visit: Payer: Commercial Managed Care - HMO

## 2015-04-25 ENCOUNTER — Ambulatory Visit
Admission: RE | Admit: 2015-04-25 | Discharge: 2015-04-25 | Disposition: A | Discharge status: Home / Self Care | Payer: Commercial Managed Care - HMO | Source: Ambulatory Visit | Attending: Neurosurgery | Admitting: Neurosurgery

## 2015-04-25 DIAGNOSIS — M50321 Other cervical disc degeneration at C4-C5 level: Secondary | ICD-10-CM | POA: Insufficient documentation

## 2015-04-25 DIAGNOSIS — M50322 Other cervical disc degeneration at C5-C6 level: Secondary | ICD-10-CM | POA: Diagnosis not present

## 2015-04-25 DIAGNOSIS — M47812 Spondylosis without myelopathy or radiculopathy, cervical region: Secondary | ICD-10-CM | POA: Diagnosis not present

## 2015-04-25 DIAGNOSIS — R609 Edema, unspecified: Secondary | ICD-10-CM | POA: Diagnosis not present

## 2015-04-25 DIAGNOSIS — E291 Testicular hypofunction: Secondary | ICD-10-CM | POA: Diagnosis not present

## 2015-04-25 DIAGNOSIS — Z981 Arthrodesis status: Secondary | ICD-10-CM | POA: Diagnosis not present

## 2015-04-26 DIAGNOSIS — M47812 Spondylosis without myelopathy or radiculopathy, cervical region: Secondary | ICD-10-CM | POA: Diagnosis not present

## 2015-04-26 DIAGNOSIS — R6889 Other general symptoms and signs: Secondary | ICD-10-CM | POA: Diagnosis not present

## 2015-04-26 LAB — TESTOSTERONE: Testosterone: 265 ng/dL — ABNORMAL LOW (ref 348–1197)

## 2015-04-26 LAB — PSA: PROSTATE SPECIFIC AG, SERUM: 0.2 ng/mL (ref 0.0–4.0)

## 2015-07-27 ENCOUNTER — Ambulatory Visit (INDEPENDENT_AMBULATORY_CARE_PROVIDER_SITE_OTHER): Payer: Commercial Managed Care - HMO | Admitting: Family Medicine

## 2015-07-27 ENCOUNTER — Encounter: Payer: Self-pay | Admitting: Family Medicine

## 2015-07-27 VITALS — BP 126/72 | HR 87 | Temp 98.7°F | Resp 16 | Ht 73.0 in | Wt 168.6 lb

## 2015-07-27 DIAGNOSIS — F411 Generalized anxiety disorder: Secondary | ICD-10-CM

## 2015-07-27 DIAGNOSIS — E349 Endocrine disorder, unspecified: Secondary | ICD-10-CM

## 2015-07-27 DIAGNOSIS — E291 Testicular hypofunction: Secondary | ICD-10-CM | POA: Diagnosis not present

## 2015-07-27 DIAGNOSIS — F419 Anxiety disorder, unspecified: Secondary | ICD-10-CM | POA: Insufficient documentation

## 2015-07-27 DIAGNOSIS — K219 Gastro-esophageal reflux disease without esophagitis: Secondary | ICD-10-CM

## 2015-07-27 MED ORDER — ALPRAZOLAM 0.25 MG PO TABS: 0.2500 mg | ORAL_TABLET | Freq: Two times a day (BID) | ORAL | Status: DC | PRN

## 2015-07-27 MED ORDER — RANITIDINE HCL 75 MG PO TABS: 75.0000 mg | ORAL_TABLET | Freq: Two times a day (BID) | ORAL | Status: DC

## 2015-07-27 NOTE — Progress Notes (Signed)
Name: Larry Lin   MRN: [id number]    DOB: Jul 09, 1955   Date:07/27/2015       Progress Note  Subjective  Chief Complaint  Chief Complaint  Patient presents with  . Medication Refill    HPI  Hypogonadism: Pt. Presents for medication refill and follow up of Hypogonadism, he is on Testosterone replacement therapy, last level drawn in April 2017 was below the normal range at 265 ng/mL. He is on Androgel 1.62% 4 pumps daily. Since his last visit, he has noticed that as if his prostate may be enlarged, evidenced by having to get up in the middle of the night to urinate (which was not happening before). He denies any urinary retention, dysuria, hematuria, or fever and chills.  Anxiety:  Pt. Presents for refill and follow up of anxiety. He has chronic anxiety due to family issues and it also helps relax his muscles, which decreases his neck pain. He is on Alprazolam 0.25 mg twice daily as needed. Normally, his Urologist was prescribing Alprazolam and he would like to continue receiving the prescription from his PCP instead.    Past Medical History  Diagnosis Date  . GERD (gastroesophageal reflux disease)     Past Surgical History  Procedure Laterality Date  . Back surgery  2006  . Neck surgery  2015    History reviewed. No pertinent family history.  Social History   Social History  . Marital Status: Divorced    Spouse Name: N/A  . Number of Children: N/A  . Years of Education: N/A   Occupational History  . Not on file.   Social History Main Topics  . Smoking status: Former Smoker    Quit date: 01/22/1987  . Smokeless tobacco: Never Used  . Alcohol Use: Not on file  . Drug Use: No  . Sexual Activity: Not on file   Other Topics Concern  . Not on file   Social History Narrative     Current outpatient prescriptions:  .  ALPRAZolam (XANAX) 0.25 MG tablet, TK 1 T PO BID, Disp: , Rfl: 0 .  ANDROGEL PUMP 20.25 MG/ACT (1.62%) GEL, 20.25mg /act. Apply two pumps to  each shoulder qAM., Disp: 150 g, Rfl: 3 .  ascorbic acid (VITAMIN C) 100 MG tablet, Take 1 tablet by mouth daily., Disp: , Rfl:  .  Cyanocobalamin 100 MCG LOZG, Take 1 tablet by mouth daily., Disp: , Rfl:  .  HYDROcodone-acetaminophen (NORCO) 10-325 MG per tablet, Take 1 tablet by mouth 4 (four) times daily. Reported on 03/07/2015, Disp: , Rfl:  .  oxyCODONE-acetaminophen (PERCOCET) 10-325 MG tablet, TK 1 TO 2 TS PO Q 6 H PRN, Disp: , Rfl: 0 .  ranitidine (ZANTAC) 150 MG tablet, Take 1 tablet (150 mg total) by mouth 2 (two) times daily., Disp: 60 tablet, Rfl: 5 .  tadalafil (CIALIS) 5 MG tablet, Take 1 tablet (5 mg total) by mouth daily as needed., Disp: 10 tablet, Rfl: 5 .  Testosterone (ANDROGEL) 20.25 MG/1.25GM (1.62%) GEL, Apply two pumps to each shoulder every morning, Disp: 150 g, Rfl: 2  No Known Allergies   Review of Systems  Gastrointestinal: Positive for heartburn. Negative for nausea, vomiting and abdominal pain.  Genitourinary: Negative for dysuria, frequency and hematuria.    Objective  Filed Vitals:   07/27/15 1524  BP: 126/72  Pulse: 87  Temp: 98.7 F (37.1 C)  TempSrc: Oral  Resp: 16  Height: 6\' 1"  (1.854 m)  Weight: 168 lb 9.6 oz (76.476 kg)  SpO2: 97%    Physical Exam  Constitutional: He is oriented to person, place, and time and well-developed, well-nourished, and in no distress.  Cardiovascular: Normal rate, regular rhythm and normal heart sounds.   No murmur heard. Pulmonary/Chest: Effort normal and breath sounds normal. No respiratory distress. He has no wheezes.  Abdominal: Soft. Bowel sounds are normal. There is no tenderness.  Musculoskeletal: He exhibits no edema.  Neurological: He is alert and oriented to person, place, and time.  Psychiatric: Mood, memory, affect and judgment normal.  Nursing note and vitals reviewed.    Assessment & Plan  1. Gastroesophageal reflux disease without esophagitis Zantac 75 mg twice daily is working just as well  as 150 mg, continue as prescribed - ranitidine (ZANTAC) 75 MG tablet; Take 1 tablet (75 mg total) by mouth 2 (two) times daily.  Dispense: 60 tablet; Refill: 5  2. Testosterone deficiency Referred to urology for follow-up of hypogonadism and to evaluate lower urinary tract symptoms - Ambulatory referral to Urology  3. Generalized anxiety disorder  - ALPRAZolam (XANAX) 0.25 MG tablet; Take 1 tablet (0.25 mg total) by mouth 2 (two) times daily as needed for anxiety.  Dispense: 60 tablet; Refill: 0   Lockie Bothun Asad A. Faylene KurtzShah [facility] 07/27/2015 3:47 PM

## 2015-08-01 ENCOUNTER — Ambulatory Visit: Payer: Commercial Managed Care - HMO | Admitting: Family Medicine

## 2015-08-21 ENCOUNTER — Ambulatory Visit: Payer: Commercial Managed Care - HMO | Admitting: Urology

## 2015-08-28 ENCOUNTER — Ambulatory Visit: Payer: Commercial Managed Care - HMO | Admitting: Urology

## 2015-08-30 ENCOUNTER — Ambulatory Visit: Payer: Commercial Managed Care - HMO | Admitting: Urology

## 2015-09-18 ENCOUNTER — Other Ambulatory Visit: Payer: Self-pay | Admitting: Family Medicine

## 2015-09-18 DIAGNOSIS — F411 Generalized anxiety disorder: Secondary | ICD-10-CM

## 2015-09-28 ENCOUNTER — Encounter: Payer: Self-pay | Admitting: Urology

## 2015-09-28 ENCOUNTER — Ambulatory Visit (INDEPENDENT_AMBULATORY_CARE_PROVIDER_SITE_OTHER): Payer: Commercial Managed Care - HMO | Admitting: Urology

## 2015-09-28 ENCOUNTER — Telehealth: Payer: Self-pay | Admitting: Family Medicine

## 2015-09-28 VITALS — BP 133/79 | HR 85 | Ht 74.0 in | Wt 167.3 lb

## 2015-09-28 DIAGNOSIS — N528 Other male erectile dysfunction: Secondary | ICD-10-CM

## 2015-09-28 DIAGNOSIS — R351 Nocturia: Secondary | ICD-10-CM | POA: Diagnosis not present

## 2015-09-28 DIAGNOSIS — N411 Chronic prostatitis: Secondary | ICD-10-CM | POA: Diagnosis not present

## 2015-09-28 DIAGNOSIS — N529 Male erectile dysfunction, unspecified: Secondary | ICD-10-CM

## 2015-09-28 DIAGNOSIS — E291 Testicular hypofunction: Secondary | ICD-10-CM

## 2015-09-28 LAB — BLADDER SCAN AMB NON-IMAGING: Scan Result: 0

## 2015-09-28 MED ORDER — TADALAFIL 5 MG PO TABS: ORAL_TABLET | ORAL | 12 refills | Status: DC

## 2015-09-28 NOTE — Progress Notes (Signed)
09/28/2015 11:33 AM   [name] 15-May-1955 [id number]  Referring provider: Ellyn Hack, MD [address] [place], [place] [address]  Chief Complaint  Patient presents with  . New Patient (Initial Visit)    low testosterone  referred by Dr. Sherryll Burger    HPI: Patient is a 60 year old Caucasian male who presents today as a referral for hypogonadism and a worsening of his LUTS from his PCP, Dr. Sherryll Burger.  Patient is experiencing a decrease in libido, a lack of energy, a decrease in strength, a decreased enjoyment in life, sadness and/or grumpiness, erections being less strong, a recent deterioration in an ability to play sports, falling asleep after dinner and a recent deterioration in their work performance.  This is indicated by his responses to the ADAM questionnaire.  He is no longer having spontaneous erections at night.  He does not have sleep apnea.   He is currently managing his hypogonadism with AndroGel prescribed by his PCP.  He states it was effective.  He has recently been using the gel at a reduced amount in an effort to extend the medication as he is in the need for a refill.      Androgen Deficiency in the Aging Male    Row Name 09/28/15 1100         Androgen Deficiency in the Aging Male   Do you have a decrease in libido (sex drive) Yes     Do you have lack of energy Yes     Do you have a decrease in strength and/or endurance Yes     Have you lost height No     Have you noticed a decreased "enjoyment of life" Yes     Are you sad and/or grumpy Yes     Are your erections less strong Yes     Have you noticed a recent deterioration in your ability to play sports Yes     Are you falling asleep after dinner Yes     Has there been a recent deterioration in your work performance Yes       His SHIM score is 16, which is mild to moderate ED.  He has been having difficulty with erections for the last few years.  His major complaints are lack of confidence  to achieve an erection, lack of firmness, maintaining an erection to completion and the lack of satisfactory sexual intercourse.  His libido is diminished.   His risk factors for ED are age, hypogonadism and anxiety.  He denies any painful erections or curvatures with his erections.   He has tried Cialis in the past with good results.      SHIM    Row Name 09/28/15 1103         SHIM: Over the last 6 months:   How do you rate your confidence that you could get and keep an erection? Moderate     When you had erections with sexual stimulation, how often were your erections hard enough for penetration (entering your partner)? Sometimes (about half the time)     During sexual intercourse, how often were you able to maintain your erection after you had penetrated (entered) your partner? Slightly Difficult     During sexual intercourse, how difficult was it to maintain your erection to completion of intercourse? Difficult     When you attempted sexual intercourse, how often was it satisfactory for you? Difficult       SHIM  Total Score   SHIM 16        Score: 1-7 Severe ED 8-11 Moderate ED 12-16 Mild-Moderate ED 17-21 Mild ED 22-25 No ED   His IPSS score today is 21, which is severe lower urinary tract symptomatology. He is unhappy with his quality life due to his urinary symptoms.  His PVR is 0 mL.   His major complaints today are a feeling of incomplete emptying, frequency, intermittency, urgency, weak stream, straining and nocturia.  He has had these symptoms for several months.  He denies any dysuria, hematuria or suprapubic pain.   He also denies any recent fevers, chills, nausea or vomiting.  He does not have a family history of PCa.      IPSS    Row Name 09/28/15 1100         International Prostate Symptom Score   How often have you had the sensation of not emptying your bladder? About half the time     How often have you had to urinate less than every two hours? More than half  the time     How often have you found you stopped and started again several times when you urinated? About half the time     How often have you found it difficult to postpone urination? More than half the time     How often have you had a weak urinary stream? About half the time     How often have you had to strain to start urination? About half the time     How many times did you typically get up at night to urinate? 1 Time     Total IPSS Score 21       Quality of Life due to urinary symptoms   If you were to spend the rest of your life with your urinary condition just the way it is now how would you feel about that? Unhappy        Score:  1-7 Mild 8-19 Moderate 20-35 Severe      PMH: Past Medical History:  Diagnosis Date  . Arthritis   . GERD (gastroesophageal reflux disease)   . Low testosterone     Surgical History: Past Surgical History:  Procedure Laterality Date  . BACK SURGERY  2006  . diverticulitis surgery     removed some of lower intestine  . NECK SURGERY  2015  . VASECTOMY     only right side done for nerve pain    Home Medications:    Medication List       Accurate as of 09/28/15 11:33 AM. Always use your most recent med list.          ALPRAZolam 0.25 MG tablet Commonly known as:  XANAX Take 1 tablet (0.25 mg total) by mouth 2 (two) times daily as needed for anxiety.   ascorbic acid 100 MG tablet Commonly known as:  VITAMIN C Take 1 tablet by mouth daily.   Cyanocobalamin 100 MCG Lozg Take 1 tablet by mouth daily.   HYDROcodone-acetaminophen 10-325 MG tablet Commonly known as:  NORCO Take 1 tablet by mouth 4 (four) times daily. Reported on 03/07/2015   oxyCODONE-acetaminophen 10-325 MG tablet Commonly known as:  PERCOCET TK 1 TO 2 TS PO Q 6 H PRN   ranitidine 75 MG tablet Commonly known as:  ZANTAC Take 1 tablet (75 mg total) by mouth 2 (two) times daily.   tadalafil 5 MG tablet Commonly known as:  CIALIS Take 1  tablet (5 mg  total) by mouth daily as needed.   tadalafil 5 MG tablet Commonly known as:  CIALIS Take one a day for prostatitis   Testosterone 20.25 MG/1.25GM (1.62%) Gel Commonly known as:  ANDROGEL Apply two pumps to each shoulder every morning   ANDROGEL PUMP 20.25 MG/ACT (1.62%) Gel Generic drug:  Testosterone 20.25mg /act. Apply two pumps to each shoulder qAM.       Allergies: No Known Allergies  Family History: Family History  Problem Relation Age of Onset  . Kidney disease Neg Hx   . Prostate cancer Neg Hx     Social History:  reports that he quit smoking about 28 years ago. He has never used smokeless tobacco. He reports that he drinks about 0.6 oz of alcohol per week . He reports that he does not use drugs.  ROS: UROLOGY Frequent Urination?: Yes Hard to postpone urination?: Yes Burning/pain with urination?: No Get up at night to urinate?: Yes Leakage of urine?: No Urine stream starts and stops?: Yes Trouble starting stream?: Yes Do you have to strain to urinate?: No Blood in urine?: No Urinary tract infection?: No Sexually transmitted disease?: No Injury to kidneys or bladder?: No Painful intercourse?: No Weak stream?: No Erection problems?: Yes Penile pain?: No  Gastrointestinal Nausea?: Yes Vomiting?: No Indigestion/heartburn?: Yes Diarrhea?: Yes Constipation?: No  Constitutional Fever: No Night sweats?: Yes Weight loss?: Yes Fatigue?: Yes  Skin Skin rash/lesions?: No Itching?: No  Eyes Blurred vision?: Yes Double vision?: No  Ears/Nose/Throat Sore throat?: No Sinus problems?: No  Hematologic/Lymphatic Swollen glands?: No Easy bruising?: No  Cardiovascular Leg swelling?: Yes Chest pain?: No  Respiratory Cough?: No Shortness of breath?: No  Endocrine Excessive thirst?: Yes  Musculoskeletal Back pain?: Yes Joint pain?: Yes  Neurological Headaches?: Yes Dizziness?: Yes  Psychologic Depression?: No Anxiety?: Yes  Physical  Exam: BP 133/79   Pulse 85   Ht 6\' 2"  (1.88 m)   Wt 167 lb 4.8 oz (75.9 kg)   BMI 21.48 kg/m   Constitutional: Well nourished. Alert and oriented, No acute distress. HEENT: [place] AT, moist mucus membranes. Trachea midline, no masses. Cardiovascular: No clubbing, cyanosis, or edema. Respiratory: Normal respiratory effort, no increased work of breathing. GI: Abdomen is soft, non tender, non distended, no abdominal masses. Liver and spleen not palpable.  No hernias appreciated.  Stool sample for occult testing is not indicated.   GU: No CVA tenderness.  No bladder fullness or masses.  Patient with circumcised phallus.  Urethral meatus is patent.  No penile discharge. No penile lesions or rashes. Scrotum without lesions, cysts, rashes and/or edema.  Testicles are located scrotally bilaterally. No masses are appreciated in the testicles. Left and right epididymis are normal. Rectal: Patient with  normal sphincter tone. Anus and perineum without scarring or rashes. No rectal masses are appreciated. Prostate is approximately 35 grams, tender (pain out of proportion to exam), no nodules are appreciated. Seminal vesicles are normal. Skin: No rashes, bruises or suspicious lesions. Lymph: No cervical or inguinal adenopathy. Neurologic: Grossly intact, no focal deficits, moving all 4 extremities. Psychiatric: Normal mood and affect.  Laboratory Data: Lab Results  Component Value Date   WBC 7.4 07/12/2014   HCT 44.9 07/12/2014   MCV 93 07/12/2014   PLT 209 07/12/2014    Lab Results  Component Value Date   CREATININE 1.01 11/25/2014    Lab Results  Component Value Date   TESTOSTERONE 265 (L) 04/25/2015    Lab Results  Component Value  Date   HGBA1C 5.5 11/25/2014    Lab Results  Component Value Date   AST 23 11/25/2014   Lab Results  Component Value Date   ALT 17 11/25/2014   PSA    0.2 mg/mL on 04/25/2015   Assessment & Plan:    1. Hypogonadism:     - currently managed with  AndroGel with Dr. Sherryll BurgerShah  - defer treatment to Dr. Sherryll BurgerShah unless he would like us to manage the hypogonadism  2. Erectile dysfunction  - SHIM score is 16   - currently managed with on demand Cialis with Dr. Sherryll BurgerShah  - initiating Cialis 5 mg daily for prostatitis/LUTS at this time  3. LUTS/Prostatitis  - IPSS score is 21/5  - Continue conservative management, avoiding bladder irritants and timed voiding's  - Discussed PT for pelvic relaxation - patient is interested and will make the referral  - Patient would like to try Cialis daily as he had read it helps with BPH - explained that the prostate is not enlarged but may help with his symptoms - doubtful concerning insurance coverage  - Script send for Cialis 5 mg daily to pharmacy  - RTC in 1 month for IPSS and symptom recheck  4. Nocturia  - most bothersome symptom even though it is once nightly  - I explained to the patient that nocturia is often multi-factorial and difficult to treat.  Sleeping disorders, heart conditions and peripheral vascular disease, diabetes,  enlarged prostate or urethral stricture causing bladder outlet obstruction and/or certain medications.  I have suggested that the patient avoid caffeine and alcohol in the evening. He may also benefit from fluid restrictions after 6:00 in the evening and voiding just prior to bedtime.  Research studies have showed that over 84% of patients with sleep apnea reported frequent nighttime urination.   With sleep apnea, oxygen decreases, carbon dioxide increases, the blood become more acidic, the heart rate drops and blood vessels in the lung constrict.  The body is then alerted that something is very wrong. The sleeper must wake enough to reopen the airway. By this time, the heart is racing and experiences a false signal of fluid overload. The heart excretes a hormone-like protein that tells the body to get rid of sodium and water, resulting in nocturia.     Even though he is only getting up  once nightly, this may be the first noted sign of sleep apnea.    The patient may also benefit from a discussion with his primary care physician to see if he has risk factors for sleep apnea or other sleep disturbances and obtaining a sleep study.   Return in about 1 month (around 10/28/2015) for IPSS score.  These notes generated with voice recognition software. I apologize for typographical errors.  Michiel CowboySHANNON Mckaylin Bastien, PA-C  [facility] [address], [address] [place], [place] [address] [phone]

## 2015-09-28 NOTE — Telephone Encounter (Signed)
[employer] authorization has been completed.  Approval # is [id number] effective 09/28/15 for 6 visits.  The approval # has also be placed in the referral order under authorization.

## 2015-10-02 ENCOUNTER — Ambulatory Visit: Payer: Commercial Managed Care - HMO | Admitting: Family Medicine

## 2015-10-03 ENCOUNTER — Ambulatory Visit (INDEPENDENT_AMBULATORY_CARE_PROVIDER_SITE_OTHER): Payer: Commercial Managed Care - HMO | Admitting: Family Medicine

## 2015-10-03 ENCOUNTER — Encounter: Payer: Self-pay | Admitting: Family Medicine

## 2015-10-03 DIAGNOSIS — E291 Testicular hypofunction: Secondary | ICD-10-CM | POA: Diagnosis not present

## 2015-10-03 DIAGNOSIS — F411 Generalized anxiety disorder: Secondary | ICD-10-CM | POA: Diagnosis not present

## 2015-10-03 DIAGNOSIS — E349 Endocrine disorder, unspecified: Secondary | ICD-10-CM

## 2015-10-03 MED ORDER — ALPRAZOLAM 0.5 MG PO TABS: 0.2500 mg | ORAL_TABLET | Freq: Two times a day (BID) | ORAL | 0 refills | Status: DC | PRN

## 2015-10-03 MED ORDER — ANDROGEL PUMP 20.25 MG/ACT (1.62%) TD GEL: TRANSDERMAL | 3 refills | Status: DC

## 2015-10-03 NOTE — Progress Notes (Signed)
Name: Larry KindsJeffrey L Lin   MRN: [id number]    DOB: 15-Oct-1955   Date:10/03/2015       Progress Note  Subjective  Chief Complaint  Chief Complaint  Patient presents with  . Medication Refill    Anxiety  Presents for initial visit. The problem has been gradually worsening (pt.'s urologist believes that anxiety and stress is causing his prostse to become more sensitive and he is having to wake up at night at least once to urinate). Symptoms include excessive worry, insomnia, muscle tension and panic. Patient reports no chest pain, nervous/anxious behavior or shortness of breath. The quality of sleep is poor.      Hypogonadism: Pt. Returns for follow up. He is on Androgel 81 mg daily (4 pumps of 20.25mg  daily), last testosterone level obtained in April 2017 was below normal at 265. Pt. Feels fatigued, has trouble maintaining an erection.  Androgel help with the fatigue, no side effects reported and is also being followed by Urologist.  Past Medical History:  Diagnosis Date  . Arthritis   . GERD (gastroesophageal reflux disease)   . Low testosterone     Past Surgical History:  Procedure Laterality Date  . BACK SURGERY  2006  . diverticulitis surgery     removed some of lower intestine  . NECK SURGERY  2015  . VASECTOMY     only right side done for nerve pain    Family History  Problem Relation Age of Onset  . Kidney disease Neg Hx   . Prostate cancer Neg Hx     Social History   Social History  . Marital status: Divorced    Spouse name: N/A  . Number of children: N/A  . Years of education: N/A   Occupational History  . Not on file.   Social History Main Topics  . Smoking status: Former Smoker    Quit date: 01/22/1987  . Smokeless tobacco: Never Used  . Alcohol use 0.6 oz/week    1 Cans of beer per week     Comment: rare  . Drug use: No  . Sexual activity: Not on file   Other Topics Concern  . Not on file   Social History Narrative  . No narrative on file      Current Outpatient Prescriptions:  .  ALPRAZolam (XANAX) 0.25 MG tablet, Take 1 tablet (0.25 mg total) by mouth 2 (two) times daily as needed for anxiety., Disp: 60 tablet, Rfl: 0 .  ANDROGEL PUMP 20.25 MG/ACT (1.62%) GEL, 20.25mg /act. Apply two pumps to each shoulder qAM., Disp: 150 g, Rfl: 3 .  ascorbic acid (VITAMIN C) 100 MG tablet, Take 1 tablet by mouth daily., Disp: , Rfl:  .  Cyanocobalamin 100 MCG LOZG, Take 1 tablet by mouth daily., Disp: , Rfl:  .  HYDROcodone-acetaminophen (NORCO) 10-325 MG per tablet, Take 1 tablet by mouth 4 (four) times daily. Reported on 03/07/2015, Disp: , Rfl:  .  oxyCODONE-acetaminophen (PERCOCET) 10-325 MG tablet, TK 1 TO 2 TS PO Q 6 H PRN, Disp: , Rfl: 0 .  ranitidine (ZANTAC) 75 MG tablet, Take 1 tablet (75 mg total) by mouth 2 (two) times daily., Disp: 60 tablet, Rfl: 5 .  tadalafil (CIALIS) 5 MG tablet, Take 1 tablet (5 mg total) by mouth daily as needed., Disp: 10 tablet, Rfl: 5 .  tadalafil (CIALIS) 5 MG tablet, Take one a day for prostatitis, Disp: 30 tablet, Rfl: 12 .  Testosterone (ANDROGEL) 20.25 MG/1.25GM (1.62%) GEL, Apply two pumps  to each shoulder every morning, Disp: 150 g, Rfl: 2  No Known Allergies   Review of Systems  Constitutional: Positive for malaise/fatigue. Negative for chills and fever.  Respiratory: Negative for cough and shortness of breath.   Cardiovascular: Negative for chest pain.  Neurological: Negative for headaches.  Psychiatric/Behavioral: The patient has insomnia. The patient is not nervous/anxious.     Objective  Vitals:   10/03/15 1449  BP: 134/71  Pulse: 97  Resp: 16  Temp: 98.8 F (37.1 C)  TempSrc: Oral  SpO2: 96%  Weight: 169 lb 9.6 oz (76.9 kg)  Height: 6\' 2"  (1.88 m)    Physical Exam  Constitutional: He is oriented to person, place, and time and well-developed, well-nourished, and in no distress.  Cardiovascular: Normal rate, regular rhythm and normal heart sounds.   No murmur  heard. Pulmonary/Chest: Effort normal and breath sounds normal. He has no wheezes.  Abdominal: Soft. Bowel sounds are normal. There is no tenderness.  Neurological: He is alert and oriented to person, place, and time.  Psychiatric: Mood, memory, affect and judgment normal.  Nursing note and vitals reviewed.   Assessment & Plan  1. Generalized anxiety disorder Increase alprazolam to 0.5 mg twice a day when necessary for worsening anxiety, refills for alprazolam provided. Check back in one month - ALPRAZolam (XANAX) 0.5 MG tablet; Take 0.5 tablets (0.25 mg total) by mouth 2 (two) times daily as needed for anxiety.  Dispense: 60 tablet; Refill: 0  2. Testosterone deficiency  - ANDROGEL PUMP 20.25 MG/ACT (1.62%) GEL; 20.25mg /act. Apply two pumps to each shoulder qAM.  Dispense: 150 g; Refill: 3 - Testosterone - Hepatic function panel - CBC with Differential   Hilton Saephan Asad A. Faylene [facility] 10/03/2015 2:55 PM

## 2015-10-04 LAB — HEPATIC FUNCTION PANEL
ALT: 15 U/L (ref 9–46)
AST: 17 U/L (ref 10–35)
Albumin: 4.7 g/dL (ref 3.6–5.1)
Alkaline Phosphatase: 40 U/L (ref 40–115)
BILIRUBIN DIRECT: 0.1 mg/dL (ref <=0.2)
BILIRUBIN INDIRECT: 0.4 mg/dL (ref 0.2–1.2)
Total Bilirubin: 0.5 mg/dL (ref 0.2–1.2)
Total Protein: 7.8 g/dL (ref 6.1–8.1)

## 2015-10-04 LAB — CBC WITH DIFFERENTIAL/PLATELET
BASOS PCT: 0 %
Basophils Absolute: 0 cells/uL (ref 0–200)
EOS ABS: 160 {cells}/uL (ref 15–500)
Eosinophils Relative: 2 %
HEMATOCRIT: 42.5 % (ref 38.5–50.0)
Hemoglobin: 14.4 g/dL (ref 13.2–17.1)
Lymphocytes Relative: 33 %
Lymphs Abs: 2640 cells/uL (ref 850–3900)
MCH: 30.9 pg (ref 27.0–33.0)
MCHC: 33.9 g/dL (ref 32.0–36.0)
MCV: 91.2 fL (ref 80.0–100.0)
MONO ABS: 720 {cells}/uL (ref 200–950)
MPV: 8.7 fL (ref 7.5–12.5)
Monocytes Relative: 9 %
NEUTROS ABS: 4480 {cells}/uL (ref 1500–7800)
Neutrophils Relative %: 56 %
PLATELETS: 221 10*3/uL (ref 140–400)
RBC: 4.66 MIL/uL (ref 4.20–5.80)
RDW: 14.2 % (ref 11.0–15.0)
WBC: 8 10*3/uL (ref 3.8–10.8)

## 2015-10-04 LAB — TESTOSTERONE: TESTOSTERONE: 239 ng/dL — AB (ref 250–827)

## 2015-11-02 ENCOUNTER — Ambulatory Visit: Payer: Commercial Managed Care - HMO | Admitting: Family Medicine

## 2015-11-06 ENCOUNTER — Telehealth: Payer: Self-pay | Admitting: Family Medicine

## 2015-11-06 DIAGNOSIS — M5441 Lumbago with sciatica, right side: Principal | ICD-10-CM

## 2015-11-06 DIAGNOSIS — G8929 Other chronic pain: Secondary | ICD-10-CM

## 2015-11-06 NOTE — Telephone Encounter (Signed)
Pt states he is scheduled to go see Dr Murray HodgkinsBartko tomorrow, [facility]. Pt needs referral for pain doctor and also for Dr Jordan LikesPool [facility]. Im sending this high priority because he is due to go tomorrow. Please advise.

## 2015-11-06 NOTE — Telephone Encounter (Signed)
Referral to Neurosurgery and Pain Clinic are entered.

## 2015-11-07 DIAGNOSIS — R6889 Other general symptoms and signs: Secondary | ICD-10-CM | POA: Diagnosis not present

## 2015-11-07 NOTE — Telephone Encounter (Signed)
Dr Sherryll BurgerShah sent to me

## 2015-11-09 ENCOUNTER — Ambulatory Visit (INDEPENDENT_AMBULATORY_CARE_PROVIDER_SITE_OTHER): Payer: Commercial Managed Care - HMO | Admitting: Family Medicine

## 2015-11-09 ENCOUNTER — Encounter: Payer: Self-pay | Admitting: Family Medicine

## 2015-11-09 VITALS — BP 122/74 | HR 101 | Temp 98.5°F | Resp 18 | Ht 74.0 in | Wt 167.6 lb

## 2015-11-09 DIAGNOSIS — M542 Cervicalgia: Secondary | ICD-10-CM

## 2015-11-09 DIAGNOSIS — M549 Dorsalgia, unspecified: Secondary | ICD-10-CM | POA: Diagnosis not present

## 2015-11-09 DIAGNOSIS — F411 Generalized anxiety disorder: Secondary | ICD-10-CM | POA: Diagnosis not present

## 2015-11-09 DIAGNOSIS — E349 Endocrine disorder, unspecified: Secondary | ICD-10-CM | POA: Diagnosis not present

## 2015-11-09 DIAGNOSIS — G8929 Other chronic pain: Secondary | ICD-10-CM

## 2015-11-09 MED ORDER — ALPRAZOLAM 0.25 MG PO TABS: 0.2500 mg | ORAL_TABLET | Freq: Two times a day (BID) | ORAL | 0 refills | Status: DC | PRN

## 2015-11-09 NOTE — Progress Notes (Signed)
Name: Larry KindsJeffrey L Lin   MRN: [id number]    DOB: 04/15/1955   Date:11/09/2015       Progress Note  Subjective  Chief Complaint  Chief Complaint  Patient presents with  . Follow-up    medication refill  . Medication Problem    discuss androgel    Anxiety  Presents for follow-up visit. Symptoms include excessive worry, muscle tension and nervous/anxious behavior. Patient reports no panic. The severity of symptoms is moderate.       Past Medical History:  Diagnosis Date  . Arthritis   . GERD (gastroesophageal reflux disease)   . Low testosterone     Past Surgical History:  Procedure Laterality Date  . BACK SURGERY  2006  . diverticulitis surgery     removed some of lower intestine  . NECK SURGERY  2015  . VASECTOMY     only right side done for nerve pain    Family History  Problem Relation Age of Onset  . Kidney disease Neg Hx   . Prostate cancer Neg Hx     Social History   Social History  . Marital status: Divorced    Spouse name: N/A  . Number of children: N/A  . Years of education: N/A   Occupational History  . Not on file.   Social History Main Topics  . Smoking status: Former Smoker    Quit date: 01/22/1987  . Smokeless tobacco: Never Used  . Alcohol use 0.6 oz/week    1 Cans of beer per week     Comment: rare  . Drug use: No  . Sexual activity: Not on file   Other Topics Concern  . Not on file   Social History Narrative  . No narrative on file     Current Outpatient Prescriptions:  .  ALPRAZolam (XANAX) 0.5 MG tablet, Take 0.5 tablets (0.25 mg total) by mouth 2 (two) times daily as needed for anxiety., Disp: 60 tablet, Rfl: 0 .  ANDROGEL PUMP 20.25 MG/ACT (1.62%) GEL, 20.25mg /act. Apply two pumps to each shoulder qAM., Disp: 150 g, Rfl: 3 .  ascorbic acid (VITAMIN C) 100 MG tablet, Take 1 tablet by mouth daily., Disp: , Rfl:  .  Cyanocobalamin 100 MCG LOZG, Take 1 tablet by mouth daily., Disp: , Rfl:  .  HYDROcodone-acetaminophen (NORCO)  10-325 MG per tablet, Take 1 tablet by mouth 4 (four) times daily. Reported on 03/07/2015, Disp: , Rfl:  .  oxyCODONE-acetaminophen (PERCOCET) 10-325 MG tablet, TK 1 TO 2 TS PO Q 6 H PRN, Disp: , Rfl: 0 .  ranitidine (ZANTAC) 75 MG tablet, Take 1 tablet (75 mg total) by mouth 2 (two) times daily., Disp: 60 tablet, Rfl: 5 .  tadalafil (CIALIS) 5 MG tablet, Take 1 tablet (5 mg total) by mouth daily as needed., Disp: 10 tablet, Rfl: 5 .  tadalafil (CIALIS) 5 MG tablet, Take one a day for prostatitis, Disp: 30 tablet, Rfl: 12 .  Testosterone (ANDROGEL) 20.25 MG/1.25GM (1.62%) GEL, Apply two pumps to each shoulder every morning, Disp: 150 g, Rfl: 2  No Known Allergies   Review of Systems  Constitutional: Negative for malaise/fatigue.  Musculoskeletal: Positive for back pain and neck pain.  Psychiatric/Behavioral: The patient is nervous/anxious.      Objective  Vitals:   11/09/15 1100  BP: 122/74  Pulse: (!) 101  Resp: 18  Temp: 98.5 F (36.9 C)  TempSrc: Oral  SpO2: 96%  Weight: 167 lb 9.6 oz (76 kg)  Height: 6'  2" (1.88 m)    Physical Exam  Constitutional: He is oriented to person, place, and time and well-developed, well-nourished, and in no distress.  HENT:  Head: Normocephalic and atraumatic.  Cardiovascular: Normal rate, regular rhythm and normal heart sounds.   No murmur heard. Pulmonary/Chest: Effort normal and breath sounds normal.  Musculoskeletal:       Lumbar back: He exhibits tenderness, pain and spasm.  Neurological: He is alert and oriented to person, place, and time.  Skin: Skin is warm, dry and intact.  Psychiatric: Mood, memory, affect and judgment normal.  Nursing note and vitals reviewed.    Assessment & Plan  1. Generalized anxiety disorder Patient wishes to go down to the lower dose of 0.25 mg for alprazolam. Refills provided - ALPRAZolam (XANAX) 0.25 MG tablet; Take 1 tablet (0.25 mg total) by mouth 2 (two) times daily as needed for anxiety.   Dispense: 60 tablet; Refill: 0  2. Testosterone deficiency Testosterone levels below normal but patient was off AndroGel for 3-4 weeks before the lab draw. He has since resumed the previous dosage. Recheck levels in 3 months  3. Chronic neck and back pain We will request records from pain clinic to take over pain management for chronic neck and back pain. Currently on Norco 10-325 mg every 6 hours as needed   [employer] [facility] 11/09/2015 11:21 AM

## 2015-11-14 DIAGNOSIS — M961 Postlaminectomy syndrome, not elsewhere classified: Secondary | ICD-10-CM | POA: Diagnosis not present

## 2015-11-14 DIAGNOSIS — M5416 Radiculopathy, lumbar region: Secondary | ICD-10-CM | POA: Diagnosis not present

## 2015-11-14 DIAGNOSIS — F1120 Opioid dependence, uncomplicated: Secondary | ICD-10-CM | POA: Diagnosis not present

## 2015-11-14 DIAGNOSIS — M47812 Spondylosis without myelopathy or radiculopathy, cervical region: Secondary | ICD-10-CM | POA: Diagnosis not present

## 2015-11-14 DIAGNOSIS — R6889 Other general symptoms and signs: Secondary | ICD-10-CM | POA: Diagnosis not present

## 2015-11-29 DIAGNOSIS — R030 Elevated blood-pressure reading, without diagnosis of hypertension: Secondary | ICD-10-CM | POA: Diagnosis not present

## 2015-11-29 DIAGNOSIS — M9981 Other biomechanical lesions of cervical region: Secondary | ICD-10-CM | POA: Diagnosis not present

## 2015-11-29 DIAGNOSIS — R6889 Other general symptoms and signs: Secondary | ICD-10-CM | POA: Diagnosis not present

## 2015-11-30 ENCOUNTER — Ambulatory Visit (INDEPENDENT_AMBULATORY_CARE_PROVIDER_SITE_OTHER): Payer: Commercial Managed Care - HMO | Admitting: Family Medicine

## 2015-11-30 ENCOUNTER — Encounter: Payer: Self-pay | Admitting: Family Medicine

## 2015-11-30 DIAGNOSIS — M542 Cervicalgia: Secondary | ICD-10-CM

## 2015-11-30 DIAGNOSIS — M549 Dorsalgia, unspecified: Secondary | ICD-10-CM | POA: Diagnosis not present

## 2015-11-30 DIAGNOSIS — G8929 Other chronic pain: Secondary | ICD-10-CM

## 2015-11-30 DIAGNOSIS — F411 Generalized anxiety disorder: Secondary | ICD-10-CM | POA: Diagnosis not present

## 2015-11-30 MED ORDER — HYDROCODONE-ACETAMINOPHEN 10-325 MG PO TABS: 1.0000 | ORAL_TABLET | Freq: Four times a day (QID) | ORAL | 0 refills | Status: DC | PRN

## 2015-11-30 MED ORDER — ALPRAZOLAM 0.25 MG PO TABS: 0.2500 mg | ORAL_TABLET | Freq: Two times a day (BID) | ORAL | 2 refills | Status: DC | PRN

## 2015-11-30 NOTE — Progress Notes (Signed)
Name: Larry Lin   MRN: [id number]    DOB: October 10, 1955   Date:11/30/2015       Progress Note  Subjective  Chief Complaint  Chief Complaint  Patient presents with  . Follow-up    1 mo  . Medication Refill    xanax   Anxiety  Presents for follow-up visit. Symptoms include insomnia and nervous/anxious behavior. Patient reports no depressed mood, excessive worry, irritability or panic. The severity of symptoms is moderate.    Neck Pain   This is a chronic problem. The current episode started more than 1 year ago (First neck surgery in 2006). The pain is present in the midline. The pain is at a severity of 7/10. The pain is moderate. The symptoms are aggravated by position and twisting (worse without neck support, especially twisting to the right). Stiffness is present in the morning. Associated symptoms include headaches, leg pain and numbness. He has tried oral narcotics (being prescribed opioids by Neurosurgeon. History of cervical fusion x 2) for the symptoms. The treatment provided moderate relief.  Back Pain  This is a chronic problem. The current episode started more than 1 year ago (since 2001). The problem is unchanged. The pain is present in the lumbar spine. The quality of the pain is described as shooting. The pain is at a severity of 7/10 (with medications, it goes down to 6 or 7/10). The pain is moderate. Stiffness is present in the morning. Associated symptoms include headaches, leg pain and numbness. Risk factors: history of degenerative disc disease s/p lumbar fusion.     Past Medical History:  Diagnosis Date  . Arthritis   . GERD (gastroesophageal reflux disease)   . Low testosterone     Past Surgical History:  Procedure Laterality Date  . BACK SURGERY  2006  . diverticulitis surgery     removed some of lower intestine  . NECK SURGERY  2015  . VASECTOMY     only right side done for nerve pain    Family History  Problem Relation Age of Onset  . Kidney  disease Neg Hx   . Prostate cancer Neg Hx     Social History   Social History  . Marital status: Divorced    Spouse name: N/A  . Number of children: N/A  . Years of education: N/A   Occupational History  . Not on file.   Social History Main Topics  . Smoking status: Former Smoker    Quit date: 01/22/1987  . Smokeless tobacco: Never Used  . Alcohol use 0.6 oz/week    1 Cans of beer per week     Comment: rare  . Drug use: No  . Sexual activity: Not on file   Other Topics Concern  . Not on file   Social History Narrative  . No narrative on file     Current Outpatient Prescriptions:  .  ALPRAZolam (XANAX) 0.25 MG tablet, Take 1 tablet (0.25 mg total) by mouth 2 (two) times daily as needed for anxiety., Disp: 60 tablet, Rfl: 0 .  ANDROGEL PUMP 20.25 MG/ACT (1.62%) GEL, 20.25mg /act. Apply two pumps to each shoulder qAM., Disp: 150 g, Rfl: 3 .  ascorbic acid (VITAMIN C) 100 MG tablet, Take 1 tablet by mouth daily., Disp: , Rfl:  .  Cyanocobalamin 100 MCG LOZG, Take 1 tablet by mouth daily., Disp: , Rfl:  .  HYDROcodone-acetaminophen (NORCO) 10-325 MG per tablet, Take 1 tablet by mouth 4 (four) times daily. Reported on 03/07/2015,  Disp: , Rfl:  .  oxyCODONE-acetaminophen (PERCOCET) 10-325 MG tablet, TK 1 TO 2 TS PO Q 6 H PRN, Disp: , Rfl: 0 .  ranitidine (ZANTAC) 75 MG tablet, Take 1 tablet (75 mg total) by mouth 2 (two) times daily., Disp: 60 tablet, Rfl: 5 .  tadalafil (CIALIS) 5 MG tablet, Take 1 tablet (5 mg total) by mouth daily as needed., Disp: 10 tablet, Rfl: 5 .  tadalafil (CIALIS) 5 MG tablet, Take one a day for prostatitis, Disp: 30 tablet, Rfl: 12 .  Testosterone (ANDROGEL) 20.25 MG/1.25GM (1.62%) GEL, Apply two pumps to each shoulder every morning, Disp: 150 g, Rfl: 2  No Known Allergies   Review of Systems  Constitutional: Negative for irritability.  Musculoskeletal: Positive for back pain and neck pain.  Neurological: Positive for numbness and headaches.    Psychiatric/Behavioral: The patient is nervous/anxious and has insomnia.     Objective  Vitals:   11/30/15 1021  BP: 122/71  Pulse: 86  Resp: 16  Temp: 98.3 F (36.8 C)  TempSrc: Oral  SpO2: 96%  Weight: 167 lb 8 oz (76 kg)  Height: 6\' 2"  (1.88 m)    Physical Exam  Constitutional: He is oriented to person, place, and time and well-developed, well-nourished, and in no distress.  HENT:  Head: Normocephalic and atraumatic.  Musculoskeletal:       Cervical back: He exhibits tenderness, pain and spasm.       Lumbar back: He exhibits tenderness, pain and spasm.       Back:  Neurological: He is alert and oriented to person, place, and time.  Psychiatric: Mood, memory, affect and judgment normal.  Nursing note and vitals reviewed.    Assessment & Plan  1. Generalized anxiety disorder Stable, responsive to alprazolam taken twice daily as needed. - ALPRAZolam (XANAX) 0.25 MG tablet; Take 1 tablet (0.25 mg total) by mouth 2 (two) times daily as needed for anxiety. Please refill after November 18 th, 2017  Dispense: 60 tablet; Refill: 2  2. Chronic neck and back pain Long history of chronic neck and back pain, being followed by pain management and neurosurgery. History of cervical and lumbar fusion. Patient was receiving [place] from [place]ole [place], wants him to continue with PCP. Medication bottles reviewed, patient takes hydrocodone-acetaminophen 10/325 mg 1-2 tablets every 6 hours as needed. On average, he takes 5 tablets daily. We will refer patient to a different pain clinic for continued management, patient runs out of Hydrocodone prescription on Saturday, November 11 and a refill for hydrocodone for 2 weeks is provided.   - HYDROcodone-acetaminophen (NORCO) 10-325 MG tablet; Take 1 tablet by mouth every 6 (six) hours as needed. May take up to 5 tablets per day. Please refill on/after November 11 th, 2017  Dispense: 75 tablet; Refill: 0 - Ambulatory referral to Pain  Clinic   [facility] A[facility] Faylene KurtzSh[facility] 11/30/2015 10:34 AM

## 2015-12-06 ENCOUNTER — Telehealth: Payer: Self-pay

## 2015-12-06 NOTE — Telephone Encounter (Signed)
-----   Message from Ellyn HackSyed Asad A Shah, MD sent at 12/05/2015  4:45 PM EST ----- As per the patient, his neurosurgeon had recommended that he should see his PCP for refills on opioids. I have referred patient to continue to receive his prescriptions from a different pain management provider. He should see Dr. Wayland SalinasFojtik as scheduled ----- Message ----- From: Franki MonteLatisha A Rone, CMA Sent: 12/05/2015   4:20 PM To: Ellyn HackSyed Asad A Shah, MD  Patient stated that he has been scheduled to see Dr. Wayland SalinasFojtik on 01/11/16 but wanted something earlier and wanted to try Dr. Ollen BowlHarkins office. I called and left a message to see if that had any openings.  12/05/15 @ 4:06pm Leah from [facility] called to say that she will ask Dr. Ollen BowlHarkins if it is ok to see this patient since they are in the same office as Dr. Murray HodgkinsBartko and then she will call me back.  12/05/15 @ 4:19pm Leah spoke with Dr. Ollen BowlHarkins and he feels it will be better to stay with Dr. Murray HodgkinsBartko and stated that there was nothing in the note about him saying this patient should go to another facility.  Please call patient

## 2015-12-06 NOTE — Telephone Encounter (Signed)
I contacted this patient to inform him of the messages below but there was no answer. A message was left for him stating waht Dr. Sherryll BurgerShah has recommended and that if he had any questions to give us a call back.

## 2015-12-07 ENCOUNTER — Ambulatory Visit: Payer: Self-pay | Admitting: Family Medicine

## 2015-12-21 ENCOUNTER — Ambulatory Visit (INDEPENDENT_AMBULATORY_CARE_PROVIDER_SITE_OTHER): Payer: Commercial Managed Care - HMO | Admitting: Family Medicine

## 2015-12-21 ENCOUNTER — Encounter: Payer: Self-pay | Admitting: Family Medicine

## 2015-12-21 DIAGNOSIS — G8929 Other chronic pain: Principal | ICD-10-CM

## 2015-12-21 DIAGNOSIS — E349 Endocrine disorder, unspecified: Secondary | ICD-10-CM | POA: Diagnosis not present

## 2015-12-21 DIAGNOSIS — M542 Cervicalgia: Secondary | ICD-10-CM

## 2015-12-21 DIAGNOSIS — M549 Dorsalgia, unspecified: Secondary | ICD-10-CM | POA: Diagnosis not present

## 2015-12-21 MED ORDER — ANDROGEL PUMP 20.25 MG/ACT (1.62%) TD GEL: TRANSDERMAL | 3 refills | Status: DC

## 2015-12-21 MED ORDER — HYDROCODONE-ACETAMINOPHEN 10-325 MG PO TABS: 1.0000 | ORAL_TABLET | Freq: Four times a day (QID) | ORAL | 0 refills | Status: DC | PRN

## 2015-12-21 NOTE — Progress Notes (Signed)
Name: Larry Lin   MRN: [id number]    DOB: Nov 05, 1955   Date:12/21/2015       Progress Note  Subjective  Chief Complaint  Chief Complaint  Patient presents with  . Medication Refill    Hydrocodone appointment with pain management is Dec.14th.    HPI  Chronic Neck and Back Pain: Pt. Presents for medication refill of Hydrocodone-Acetaminophen 10-325 mg every 6 hours as needed, has history of chronic low back pain and neck pain. MRI of cervical spine showed degenerative disc disease, had 2 surgeries on his neck. He also has degenerative disc disease of lumbar spine, had two surgeries (fusions) on the lower back. His symptoms include low back pain with radiation into the groin and legs, neck pain radiating into the head. [place] and has an appointment on December 14th, here to receive a refill for Hydrocodone until his pain management appointment.      Past Medical History:  Diagnosis Date  . Arthritis   . GERD (gastroesophageal reflux disease)   . Low testosterone     Past Surgical History:  Procedure Laterality Date  . BACK SURGERY  2006  . diverticulitis surgery     removed some of lower intestine  . NECK SURGERY  2015  . VASECTOMY     only right side done for nerve pain    Family History  Problem Relation Age of Onset  . Kidney disease Neg Hx   . Prostate cancer Neg Hx     Social History   Social History  . Marital status: Divorced    Spouse name: N/A  . Number of children: N/A  . Years of education: N/A   Occupational History  . Not on file.   Social History Main Topics  . Smoking status: Former Smoker    Quit date: 01/22/1987  . Smokeless tobacco: Never Used  . Alcohol use 0.6 oz/week    1 Cans of beer per week     Comment: rare  . Drug use: No  . Sexual activity: Not on file   Other Topics Concern  . Not on file   Social History Narrative  . No narrative on file     Current Outpatient  Prescriptions:  .  ALPRAZolam (XANAX) 0.25 MG tablet, Take 1 tablet (0.25 mg total) by mouth 2 (two) times daily as needed for anxiety. Please refill after November 18 th, 2017, Disp: 60 tablet, Rfl: 2 .  ANDROGEL PUMP 20.25 MG/ACT (1.62%) GEL, 20.25mg /act. Apply two pumps to each shoulder qAM., Disp: 150 g, Rfl: 3 .  ascorbic acid (VITAMIN C) 100 MG tablet, Take 1 tablet by mouth daily., Disp: , Rfl:  .  Cyanocobalamin 100 MCG LOZG, Take 1 tablet by mouth daily., Disp: , Rfl:  .  HYDROcodone-acetaminophen (NORCO) 10-325 MG tablet, Take 1 tablet by mouth every 6 (six) hours as needed. May take up to 5 tablets per day. Please refill on/after November 11 th, 2017, Disp: 75 tablet, Rfl: 0 .  tadalafil (CIALIS) 5 MG tablet, Take 1 tablet (5 mg total) by mouth daily as needed., Disp: 10 tablet, Rfl: 5 .  tadalafil (CIALIS) 5 MG tablet, Take one a day for prostatitis, Disp: 30 tablet, Rfl: 12 .  Testosterone (ANDROGEL) 20.25 MG/1.25GM (1.62%) GEL, Apply two pumps to each shoulder every morning, Disp: 150 g, Rfl: 2 .  oxyCODONE-acetaminophen (PERCOCET) 10-325 MG tablet, TK 1 TO 2 TS PO Q 6 H PRN, Disp: ,  Rfl: 0 .  ranitidine (ZANTAC) 75 MG tablet, Take 1 tablet (75 mg total) by mouth 2 (two) times daily. (Patient not taking: Reported on 12/21/2015), Disp: 60 tablet, Rfl: 5  No Known Allergies   Review of Systems  Constitutional: Positive for malaise/fatigue. Negative for chills and fever.  Genitourinary: Negative for dysuria, frequency, hematuria and urgency.  Musculoskeletal: Positive for back pain and neck pain. Negative for joint pain.  Psychiatric/Behavioral: Negative for depression. The patient is nervous/anxious.      Objective  Vitals:   12/21/15 1116  BP: 112/60  Pulse: 99  Temp: 98.7 F (37.1 C)  SpO2: 98%  Weight: 168 lb 9.6 oz (76.5 kg)    Physical Exam  Constitutional: He is oriented to person, place, and time and well-developed, well-nourished, and in no distress.  HENT:    Head: Normocephalic and atraumatic.  Cardiovascular: Normal rate, regular rhythm and normal heart sounds.   No murmur heard. Pulmonary/Chest: Effort normal and breath sounds normal. He has no wheezes.  Musculoskeletal:       Cervical back: He exhibits tenderness, pain and spasm.       Lumbar back: He exhibits tenderness.       Back:  Neurological: He is alert and oriented to person, place, and time.  Psychiatric: Mood, memory, affect and judgment normal.  Nursing note and vitals reviewed.      Assessment & Plan  1. Chronic neck and back pain Stable and responsive to hydrocortisone, refills provided and to patient's appointment with pain clinic - HYDROcodone-acetaminophen (NORCO) 10-325 MG tablet; Take 1 tablet by mouth every 6 (six) hours as needed. May take up to 5 tablets per day.  Dispense: 75 tablet; Refill: 0  2. Testosterone deficiency Repeat testosterone levels in 1-2 months - ANDROGEL PUMP 20.25 MG/ACT (1.62%) GEL; 20.25mg /act. Apply two pumps to each shoulder qAM.  Dispense: 150 g; Refill: 3  Larry Lin Larry A. Faylene KurtzShah [facility] 12/21/2015 11:41 AM

## 2015-12-25 DIAGNOSIS — M9981 Other biomechanical lesions of cervical region: Secondary | ICD-10-CM | POA: Diagnosis not present

## 2015-12-25 DIAGNOSIS — F1120 Opioid dependence, uncomplicated: Secondary | ICD-10-CM | POA: Diagnosis not present

## 2015-12-25 DIAGNOSIS — R6889 Other general symptoms and signs: Secondary | ICD-10-CM | POA: Diagnosis not present

## 2015-12-25 DIAGNOSIS — M961 Postlaminectomy syndrome, not elsewhere classified: Secondary | ICD-10-CM | POA: Diagnosis not present

## 2015-12-25 DIAGNOSIS — M5416 Radiculopathy, lumbar region: Secondary | ICD-10-CM | POA: Diagnosis not present

## 2015-12-29 ENCOUNTER — Ambulatory Visit: Payer: Commercial Managed Care - HMO | Admitting: Physical Therapy

## 2016-01-03 ENCOUNTER — Encounter: Payer: Commercial Managed Care - HMO | Admitting: Physical Therapy

## 2016-01-04 ENCOUNTER — Telehealth: Payer: Self-pay

## 2016-01-04 ENCOUNTER — Telehealth: Payer: Self-pay | Admitting: Family Medicine

## 2016-01-04 DIAGNOSIS — M549 Dorsalgia, unspecified: Principal | ICD-10-CM

## 2016-01-04 DIAGNOSIS — G8929 Other chronic pain: Principal | ICD-10-CM

## 2016-01-04 DIAGNOSIS — M542 Cervicalgia: Secondary | ICD-10-CM

## 2016-01-04 MED ORDER — HYDROCODONE-ACETAMINOPHEN 10-325 MG PO TABS: 1.0000 | ORAL_TABLET | Freq: Four times a day (QID) | ORAL | 0 refills | Status: DC | PRN

## 2016-01-04 NOTE — Telephone Encounter (Signed)
I called patient to inform him that his [facility] will have to be cancelled due to her being out of network unless he wanted to pay out of pocket but there was no answer.  I then called [facility] to let them know that they should ask the patient if he still wanted to be seen due to not having out of network coverage and the lady stated that she will put it in the note since he had an appt today at 12:30pm.

## 2016-01-04 NOTE — Telephone Encounter (Signed)
Patient had appointment with pain clinic today but was not able to be seen due to insurance issues. [facility] is in process of trying to get him in with a different pain clinic, however he is needing a refill on hydrocodone. He will be out tomorrow. Patient wanted me to schedule him an appointment to see you however you are booked until 2018

## 2016-01-04 NOTE — Telephone Encounter (Signed)
Provided a 15 day supply of hydrocodone, patient should be referred to a different pain clinic and should establish care with them ASAP

## 2016-01-04 NOTE — Telephone Encounter (Signed)
Patient returned my call and everything was explained to him. He stated that he still wanted to try [facility] in [place] and needed to get back in with Dr. Sherryll BurgerShah.  I told him that I would inform Dr. Sherryll BurgerShah and [facility] be [facility] for an office visit.

## 2016-01-04 NOTE — Telephone Encounter (Signed)
Patient called wanting a referral to [facility] [place] so that he can have a cervical pain block. He stated that Dr. Sherryll BurgerShah highly recommend this facility and Dr. Rochele RaringSunil Dogra.   The requested information has been sent to their office for review.  He also called wanting to make sure that [facility] was done so that he would not have to reschedule his appt with Dr. Krista BlueElizabeth Fojtik at [facility] on 01/11/16.  It was given to [facility] to process.

## 2016-01-04 NOTE — Telephone Encounter (Signed)
Prescription for hydrocodone printed for 2 weeks (75 tablets), he should be referred and scheduled with a different pain clinic and should establish care with them ASAP

## 2016-01-05 NOTE — Telephone Encounter (Signed)
Melissa called and informed patient.

## 2016-01-09 DIAGNOSIS — R6889 Other general symptoms and signs: Secondary | ICD-10-CM | POA: Diagnosis not present

## 2016-01-09 DIAGNOSIS — M47812 Spondylosis without myelopathy or radiculopathy, cervical region: Secondary | ICD-10-CM | POA: Diagnosis not present

## 2016-01-10 ENCOUNTER — Encounter: Payer: Commercial Managed Care - HMO | Admitting: Physical Therapy

## 2016-01-17 ENCOUNTER — Ambulatory Visit: Payer: Commercial Managed Care - HMO | Admitting: Physical Therapy

## 2016-01-23 ENCOUNTER — Telehealth: Payer: Self-pay | Admitting: Family Medicine

## 2016-01-23 NOTE — Telephone Encounter (Signed)
Pt have appointment for 01/30/16 but is requesting refill on hydrocodone. Have pain management appointment on 02/15/16. States you have been refilling the medication until his appointment. Pt did try to change appointment to sooner day but you where completely booked.

## 2016-01-24 ENCOUNTER — Other Ambulatory Visit: Payer: Self-pay | Admitting: Family Medicine

## 2016-01-24 DIAGNOSIS — M542 Cervicalgia: Secondary | ICD-10-CM

## 2016-01-24 DIAGNOSIS — M549 Dorsalgia, unspecified: Principal | ICD-10-CM

## 2016-01-24 DIAGNOSIS — G8929 Other chronic pain: Principal | ICD-10-CM

## 2016-01-25 ENCOUNTER — Other Ambulatory Visit: Payer: Self-pay | Admitting: Family Medicine

## 2016-01-25 DIAGNOSIS — G8929 Other chronic pain: Principal | ICD-10-CM

## 2016-01-25 DIAGNOSIS — M549 Dorsalgia, unspecified: Principal | ICD-10-CM

## 2016-01-25 DIAGNOSIS — M542 Cervicalgia: Secondary | ICD-10-CM

## 2016-01-25 MED ORDER — HYDROCODONE-ACETAMINOPHEN 10-325 MG PO TABS: 1.0000 | ORAL_TABLET | Freq: Four times a day (QID) | ORAL | 0 refills | Status: DC | PRN

## 2016-01-25 NOTE — Telephone Encounter (Signed)
Prescription for hydrocodone has been printed and is ready for pickup.

## 2016-01-26 NOTE — Telephone Encounter (Signed)
Left voice message informing patient.

## 2016-01-30 ENCOUNTER — Ambulatory Visit: Payer: Commercial Managed Care - HMO | Admitting: Family Medicine

## 2016-01-31 ENCOUNTER — Ambulatory Visit: Payer: Commercial Managed Care - HMO | Admitting: Physical Therapy

## 2016-02-13 ENCOUNTER — Telehealth: Payer: Self-pay

## 2016-02-13 ENCOUNTER — Encounter: Payer: Self-pay | Admitting: Family Medicine

## 2016-02-13 ENCOUNTER — Ambulatory Visit (INDEPENDENT_AMBULATORY_CARE_PROVIDER_SITE_OTHER): Payer: Commercial Managed Care - HMO | Admitting: Family Medicine

## 2016-02-13 VITALS — BP 110/84 | HR 100 | Temp 97.2°F | Resp 18 | Ht 74.0 in | Wt 171.3 lb

## 2016-02-13 DIAGNOSIS — M542 Cervicalgia: Secondary | ICD-10-CM

## 2016-02-13 DIAGNOSIS — E349 Endocrine disorder, unspecified: Secondary | ICD-10-CM

## 2016-02-13 DIAGNOSIS — M549 Dorsalgia, unspecified: Secondary | ICD-10-CM

## 2016-02-13 DIAGNOSIS — G8929 Other chronic pain: Secondary | ICD-10-CM

## 2016-02-13 DIAGNOSIS — N429 Disorder of prostate, unspecified: Secondary | ICD-10-CM | POA: Diagnosis not present

## 2016-02-13 MED ORDER — HYDROCODONE-ACETAMINOPHEN 10-325 MG PO TABS: 1.0000 | ORAL_TABLET | Freq: Four times a day (QID) | ORAL | 0 refills | Status: DC | PRN

## 2016-02-13 NOTE — Telephone Encounter (Signed)
I contact this patient to see if he wanted to proceed with the [place] to [place] [facility] [place], but there was no answer. A message was left for this patient to give us a call to let us know if he was interested or not.

## 2016-02-13 NOTE — Progress Notes (Signed)
Name: Larry Lin   MRN: [id number]    DOB: [birth year]   Date:02/13/2016       Progress Note  Subjective  Chief Complaint  Chief Complaint  Patient presents with  . Medication Refill  . Follow-up    Testosterone labs    HPI  Pt. Presents to recheck testosterone levels, has history of low testosterone, last reading obtained in September 2017 showed level to be below normal at 239 ng/dL, he applied Androgel 81 mg daily. No urinary symptoms reported.    Past Medical History:  Diagnosis Date  . Arthritis   . GERD (gastroesophageal reflux disease)   . Low testosterone     Past Surgical History:  Procedure Laterality Date  . BACK SURGERY  2006  . diverticulitis surgery     removed some of lower intestine  . NECK SURGERY  2015  . VASECTOMY     only right side done for nerve pain    Family History  Problem Relation Age of Onset  . Kidney disease Neg Hx   . Prostate cancer Neg Hx     Social History   Social History  . Marital status: Divorced    Spouse name: N/A  . Number of children: N/A  . Years of education: N/A   Occupational History  . Not on file.   Social History Main Topics  . Smoking status: Former Smoker    Quit date: 01/22/1987  . Smokeless tobacco: Never Used  . Alcohol use 0.6 oz/week    1 Cans of beer per week     Comment: rare  . Drug use: No  . Sexual activity: Not on file   Other Topics Concern  . Not on file   Social History Narrative  . No narrative on file     Current Outpatient Prescriptions:  .  ALPRAZolam (XANAX) 0.25 MG tablet, Take 1 tablet (0.25 mg total) by mouth 2 (two) times daily as needed for anxiety. Please refill after November 18 th, 2017, Disp: 60 tablet, Rfl: 2 .  ANDROGEL PUMP 20.25 MG/ACT (1.62%) GEL, 20.25mg /act. Apply two pumps to each shoulder qAM., Disp: 150 g, Rfl: 3 .  ascorbic acid (VITAMIN C) 100 MG tablet, Take 1 tablet by mouth daily., Disp: , Rfl:  .  Cyanocobalamin 100 MCG LOZG, Take 1 tablet by  mouth daily., Disp: , Rfl:  .  HYDROcodone-acetaminophen (NORCO) 10-325 MG tablet, Take 1 tablet by mouth every 6 (six) hours as needed. May take up to 5 tablets per day., Disp: 75 tablet, Rfl: 0 .  ranitidine (ZANTAC) 75 MG tablet, Take 1 tablet (75 mg total) by mouth 2 (two) times daily., Disp: 60 tablet, Rfl: 5 .  tadalafil (CIALIS) 5 MG tablet, Take 1 tablet (5 mg total) by mouth daily as needed., Disp: 10 tablet, Rfl: 5 .  tadalafil (CIALIS) 5 MG tablet, Take one a day for prostatitis, Disp: 30 tablet, Rfl: 12 .  Testosterone (ANDROGEL) 20.25 MG/1.25GM (1.62%) GEL, Apply two pumps to each shoulder every morning, Disp: 150 g, Rfl: 2 .  oxyCODONE-acetaminophen (PERCOCET) 10-325 MG tablet, TK 1 TO 2 TS PO Q 6 H PRN, Disp: , Rfl: 0  No Known Allergies   ROS  Please see history of present illness for complete description of ROS  Objective  Vitals:   02/13/16 1134  BP: 110/84  Pulse: 100  Resp: 18  Temp: 97.2 F (36.2 C)  TempSrc: Oral  SpO2: 93%  Weight: 171 lb 4.8 oz (77.7  kg)  Height: 6\' 2"  (1.88 m)    Physical Exam  Constitutional: He is oriented to person, place, and time and well-developed, well-nourished, and in no distress.  HENT:  Head: Normocephalic and atraumatic.  Cardiovascular: Normal rate, regular rhythm and normal heart sounds.   No murmur heard. Pulmonary/Chest: Effort normal and breath sounds normal. He has no wheezes.  Abdominal: Soft. Bowel sounds are normal. There is no tenderness.  Neurological: He is alert and oriented to person, place, and time.  Nursing note and vitals reviewed.     Assessment & Plan  1. Testosterone deficiency Obtain pertinent labs including PSA, hepatic panel, CBC and testosterone. Continue on Andro gel, adjust therapy after review of lab results - CBC with Differential - Testosterone - PSA - Hepatic function panel  2. Chronic neck and back pain Has not been able to see a pain management provider, local provider does not  take his insurance, he has ran out of hydrocodone which was prescribed in December. We will check with the referral coordinator to determine how soon and where can he be seen. Refill for 15 days provided - HYDROcodone-acetaminophen (NORCO) 10-325 MG tablet; Take 1 tablet by mouth every 6 (six) hours as needed. May take up to 5 tablets per day.  Dispense: 75 tablet; Refill: 0   Syed Asad A. Faylene KurtzShah [facility] 02/13/2016 12:01 PM

## 2016-02-14 ENCOUNTER — Ambulatory Visit: Payer: Commercial Managed Care - HMO | Admitting: Physical Therapy

## 2016-02-14 LAB — CBC WITH DIFFERENTIAL/PLATELET
Basophils Absolute: 0 cells/uL (ref 0–200)
Basophils Relative: 0 %
EOS ABS: 142 {cells}/uL (ref 15–500)
Eosinophils Relative: 2 %
HCT: 44.6 % (ref 38.5–50.0)
Hemoglobin: 14.8 g/dL (ref 13.2–17.1)
LYMPHS PCT: 30 %
Lymphs Abs: 2130 cells/uL (ref 850–3900)
MCH: 30.4 pg (ref 27.0–33.0)
MCHC: 33.2 g/dL (ref 32.0–36.0)
MCV: 91.6 fL (ref 80.0–100.0)
MONO ABS: 426 {cells}/uL (ref 200–950)
MONOS PCT: 6 %
MPV: 9 fL (ref 7.5–12.5)
NEUTROS PCT: 62 %
Neutro Abs: 4402 cells/uL (ref 1500–7800)
Platelets: 233 10*3/uL (ref 140–400)
RBC: 4.87 MIL/uL (ref 4.20–5.80)
RDW: 13.9 % (ref 11.0–15.0)
WBC: 7.1 10*3/uL (ref 3.8–10.8)

## 2016-02-14 LAB — HEPATIC FUNCTION PANEL
ALT: 18 U/L (ref 9–46)
AST: 20 U/L (ref 10–35)
Albumin: 4.8 g/dL (ref 3.6–5.1)
Alkaline Phosphatase: 44 U/L (ref 40–115)
BILIRUBIN DIRECT: 0.2 mg/dL (ref <=0.2)
Indirect Bilirubin: 0.7 mg/dL (ref 0.2–1.2)
Total Bilirubin: 0.9 mg/dL (ref 0.2–1.2)
Total Protein: 7.8 g/dL (ref 6.1–8.1)

## 2016-02-14 LAB — PSA: PSA: 0.2 ng/mL (ref <=4.0)

## 2016-02-15 LAB — TESTOSTERONE: Testosterone: 310 ng/dL (ref 250–827)

## 2016-02-28 ENCOUNTER — Encounter: Payer: Self-pay | Admitting: Physical Therapy

## 2016-02-28 ENCOUNTER — Telehealth: Payer: Self-pay

## 2016-02-28 NOTE — Telephone Encounter (Signed)
This patient called wanting to be referred to one of the listed providers below.  Luther BradleyFarooque S. Khan, MD [facility] [address] [place], [place] [address] [phone] Fax: [phone]   [facility] Dr. Roselie Awkwardharles Plummer [address] [place], [place] [address] Tel: [phone] Fax: [phone]   New referral will be sent to Dr. Ronita HippsFarooque Khan.

## 2016-02-29 ENCOUNTER — Other Ambulatory Visit: Payer: Self-pay | Admitting: Family Medicine

## 2016-02-29 DIAGNOSIS — G8929 Other chronic pain: Principal | ICD-10-CM

## 2016-02-29 DIAGNOSIS — M549 Dorsalgia, unspecified: Principal | ICD-10-CM

## 2016-02-29 DIAGNOSIS — M542 Cervicalgia: Secondary | ICD-10-CM

## 2016-02-29 MED ORDER — HYDROCODONE-ACETAMINOPHEN 10-325 MG PO TABS: 1.0000 | ORAL_TABLET | Freq: Four times a day (QID) | ORAL | 0 refills | Status: DC | PRN

## 2016-02-29 NOTE — Telephone Encounter (Signed)
Prescription for hydrocodone is ready for pickup, I have prescribed for another 15 days, Larry Lin is working to set up an appointment [place].

## 2016-03-01 NOTE — Telephone Encounter (Signed)
Patient called to pick up script.

## 2016-03-14 ENCOUNTER — Encounter: Payer: Self-pay | Admitting: Physical Therapy

## 2016-03-20 ENCOUNTER — Other Ambulatory Visit: Payer: Self-pay | Admitting: Family Medicine

## 2016-03-20 DIAGNOSIS — G8929 Other chronic pain: Principal | ICD-10-CM

## 2016-03-20 DIAGNOSIS — M549 Dorsalgia, unspecified: Principal | ICD-10-CM

## 2016-03-20 DIAGNOSIS — M542 Cervicalgia: Secondary | ICD-10-CM

## 2016-03-22 ENCOUNTER — Other Ambulatory Visit: Payer: Self-pay | Admitting: Emergency Medicine

## 2016-03-22 DIAGNOSIS — M542 Cervicalgia: Secondary | ICD-10-CM

## 2016-03-22 DIAGNOSIS — M549 Dorsalgia, unspecified: Principal | ICD-10-CM

## 2016-03-22 DIAGNOSIS — G8929 Other chronic pain: Principal | ICD-10-CM

## 2016-03-22 MED ORDER — HYDROCODONE-ACETAMINOPHEN 10-325 MG PO TABS: 1.0000 | ORAL_TABLET | Freq: Four times a day (QID) | ORAL | 0 refills | Status: DC | PRN

## 2016-03-26 ENCOUNTER — Encounter: Payer: Self-pay | Admitting: Physical Therapy

## 2016-03-26 ENCOUNTER — Ambulatory Visit: Payer: Commercial Managed Care - HMO | Admitting: Family Medicine

## 2016-03-27 DIAGNOSIS — Z79899 Other long term (current) drug therapy: Secondary | ICD-10-CM | POA: Diagnosis not present

## 2016-03-27 DIAGNOSIS — M5440 Lumbago with sciatica, unspecified side: Secondary | ICD-10-CM | POA: Diagnosis not present

## 2016-03-27 DIAGNOSIS — M5011 Cervical disc disorder with radiculopathy,  high cervical region: Secondary | ICD-10-CM | POA: Diagnosis not present

## 2016-03-27 DIAGNOSIS — G894 Chronic pain syndrome: Secondary | ICD-10-CM | POA: Diagnosis not present

## 2016-03-27 DIAGNOSIS — G8929 Other chronic pain: Secondary | ICD-10-CM | POA: Diagnosis not present

## 2016-03-27 DIAGNOSIS — M5001 Cervical disc disorder with myelopathy,  high cervical region: Secondary | ICD-10-CM | POA: Diagnosis not present

## 2016-03-27 DIAGNOSIS — R6889 Other general symptoms and signs: Secondary | ICD-10-CM | POA: Diagnosis not present

## 2016-03-27 DIAGNOSIS — M542 Cervicalgia: Secondary | ICD-10-CM | POA: Diagnosis not present

## 2016-03-27 DIAGNOSIS — F1120 Opioid dependence, uncomplicated: Secondary | ICD-10-CM | POA: Diagnosis not present

## 2016-03-27 DIAGNOSIS — M961 Postlaminectomy syndrome, not elsewhere classified: Secondary | ICD-10-CM | POA: Diagnosis not present

## 2016-03-29 ENCOUNTER — Ambulatory Visit: Payer: Medicare HMO | Admitting: Physical Therapy

## 2016-04-09 ENCOUNTER — Encounter: Payer: Self-pay | Admitting: Physical Therapy

## 2016-04-16 ENCOUNTER — Ambulatory Visit: Payer: Commercial Managed Care - HMO | Admitting: Family Medicine

## 2016-04-29 DIAGNOSIS — G894 Chronic pain syndrome: Secondary | ICD-10-CM | POA: Diagnosis not present

## 2016-04-29 DIAGNOSIS — F1120 Opioid dependence, uncomplicated: Secondary | ICD-10-CM | POA: Diagnosis not present

## 2016-04-29 DIAGNOSIS — M542 Cervicalgia: Secondary | ICD-10-CM | POA: Diagnosis not present

## 2016-04-29 DIAGNOSIS — M5011 Cervical disc disorder with radiculopathy,  high cervical region: Secondary | ICD-10-CM | POA: Diagnosis not present

## 2016-04-29 DIAGNOSIS — Z79899 Other long term (current) drug therapy: Secondary | ICD-10-CM | POA: Diagnosis not present

## 2016-04-29 DIAGNOSIS — M5440 Lumbago with sciatica, unspecified side: Secondary | ICD-10-CM | POA: Diagnosis not present

## 2016-04-29 DIAGNOSIS — M961 Postlaminectomy syndrome, not elsewhere classified: Secondary | ICD-10-CM | POA: Diagnosis not present

## 2016-04-29 DIAGNOSIS — M5001 Cervical disc disorder with myelopathy,  high cervical region: Secondary | ICD-10-CM | POA: Diagnosis not present

## 2016-04-29 DIAGNOSIS — G8929 Other chronic pain: Secondary | ICD-10-CM | POA: Diagnosis not present

## 2016-04-30 ENCOUNTER — Encounter: Payer: Self-pay | Admitting: Family Medicine

## 2016-04-30 ENCOUNTER — Ambulatory Visit (INDEPENDENT_AMBULATORY_CARE_PROVIDER_SITE_OTHER): Payer: Medicare HMO | Admitting: Family Medicine

## 2016-04-30 DIAGNOSIS — K219 Gastro-esophageal reflux disease without esophagitis: Secondary | ICD-10-CM | POA: Diagnosis not present

## 2016-04-30 DIAGNOSIS — E349 Endocrine disorder, unspecified: Secondary | ICD-10-CM

## 2016-04-30 DIAGNOSIS — F411 Generalized anxiety disorder: Secondary | ICD-10-CM

## 2016-04-30 MED ORDER — ALPRAZOLAM 0.25 MG PO TABS: 0.2500 mg | ORAL_TABLET | Freq: Two times a day (BID) | ORAL | 2 refills | Status: DC | PRN

## 2016-04-30 MED ORDER — RANITIDINE HCL 150 MG PO TABS: 150.0000 mg | ORAL_TABLET | Freq: Two times a day (BID) | ORAL | 0 refills | Status: DC

## 2016-04-30 MED ORDER — ANDROGEL PUMP 20.25 MG/ACT (1.62%) TD GEL: TRANSDERMAL | 3 refills | Status: DC

## 2016-04-30 NOTE — Progress Notes (Signed)
Name: Larry Lin   MRN: [id number]    DOB: 09/11/1955   Date:04/30/2016       Progress Note  Subjective  Chief Complaint  Chief Complaint  Patient presents with  . Medication Refill    HPI  Anxiety: Patient has anxiety described as feeling nervousness, worries a lot, especially about his mother's health, takes Alprazolam 0.25 mg bid with good symptom relief.   Low testosterone: Patient has low testosterone levels, last obtained in January 2018 was normal at 310 ng/dL, takes Androgel 4 pumps each morning, no side effects reported. No urinary symptoms.   GERD: Patient is here for refill of Ranitidine 150 mg BID, takes for symptoms of acid reflux and heartburn, heartburn usually triggered by spicy foods, no dysphagia, melena, or weight loss.  Past Medical History:  Diagnosis Date  . Arthritis   . GERD (gastroesophageal reflux disease)   . Low testosterone     Past Surgical History:  Procedure Laterality Date  . BACK SURGERY  2006  . diverticulitis surgery     removed some of lower intestine  . NECK SURGERY  2015  . VASECTOMY     only right side done for nerve pain    Family History  Problem Relation Age of Onset  . Kidney disease Neg Hx   . Prostate cancer Neg Hx     Social History   Social History  . Marital status: Divorced    Spouse name: N/A  . Number of children: N/A  . Years of education: N/A   Occupational History  . Not on file.   Social History Main Topics  . Smoking status: Former Smoker    Quit date: 01/22/1987  . Smokeless tobacco: Never Used  . Alcohol use 0.6 oz/week    1 Cans of beer per week     Comment: rare  . Drug use: No  . Sexual activity: Not on file   Other Topics Concern  . Not on file   Social History Narrative  . No narrative on file     Current Outpatient Prescriptions:  .  ALPRAZolam (XANAX) 0.25 MG tablet, Take 1 tablet (0.25 mg total) by mouth 2 (two) times daily as needed for anxiety. Please refill after  November 18 th, 2017, Disp: 60 tablet, Rfl: 2 .  ANDROGEL PUMP 20.25 MG/ACT (1.62%) GEL, 20.25mg /act. Apply two pumps to each shoulder qAM., Disp: 150 g, Rfl: 3 .  ascorbic acid (VITAMIN C) 100 MG tablet, Take 1 tablet by mouth daily., Disp: , Rfl:  .  Cyanocobalamin 100 MCG LOZG, Take 1 tablet by mouth daily., Disp: , Rfl:  .  HYDROcodone-acetaminophen (NORCO) 10-325 MG tablet, Take 1 tablet by mouth every 6 (six) hours as needed. May take up to 5 tablets per day., Disp: 150 tablet, Rfl: 0 .  ranitidine (ZANTAC) 75 MG tablet, Take 1 tablet (75 mg total) by mouth 2 (two) times daily., Disp: 60 tablet, Rfl: 5 .  tadalafil (CIALIS) 5 MG tablet, Take 1 tablet (5 mg total) by mouth daily as needed., Disp: 10 tablet, Rfl: 5 .  tadalafil (CIALIS) 5 MG tablet, Take one a day for prostatitis, Disp: 30 tablet, Rfl: 12 .  Testosterone (ANDROGEL) 20.25 MG/1.25GM (1.62%) GEL, Apply two pumps to each shoulder every morning, Disp: 150 g, Rfl: 2  No Known Allergies   ROS  Please see history of present illness for complete discussion of ROS  Objective  Vitals:   04/30/16 1125  BP: 114/80  Pulse:  95  Resp: 17  Temp: 98.4 F (36.9 C)  TempSrc: Oral  SpO2: 96%  Weight: 170 lb 11.2 oz (77.4 kg)  Height:  (1.88 m)    Physical Exam  Constitutional: He is oriented to person, place, and time and well-developed, well-nourished, and in no distress.  HENT:  Head: Normocephalic and atraumatic.  Cardiovascular: Normal rate, regular rhythm and normal heart sounds.   No murmur heard. Pulmonary/Chest: Effort normal and breath sounds normal. He has no wheezes.  Abdominal: Soft. Bowel sounds are normal. There is no tenderness.  Neurological: He is alert and oriented to person, place, and time.  Nursing note and vitals reviewed.     Assessment & Plan  1. Testosterone deficiency Refill for AndroGel authorized, obtained testosterone levels, CBC and hepatic panel - ANDROGEL PUMP 20.25 MG/ACT (1.62%)  GEL; 20.25mg /act. Apply two pumps to each shoulder qAM.  Dispense: 150 g; Refill: 3 - Testosterone - CBC with Differential/Platelet - Hepatic function panel  2. Generalized anxiety disorder Stable and responsive to alprazolam taken twice a day when necessary, patient compliant with controlled substances agreement, refills provided - ALPRAZolam (XANAX) 0.25 MG tablet; Take 1 tablet (0.25 mg total) by mouth 2 (two) times daily as needed for anxiety.  Dispense: 60 tablet; Refill: 2  3. Gastroesophageal reflux disease without esophagitis  - ranitidine (ZANTAC) 150 MG tablet; Take 1 tablet (150 mg total) by mouth 2 (two) times daily.  Dispense: 180 tablet; Refill: 0   Jennamarie Goings Asad A. Faylene Kurt[facility] 04/30/2016 11:30 AM

## 2016-05-01 LAB — CBC WITH DIFFERENTIAL/PLATELET
BASOS PCT: 0 %
Basophils Absolute: 0 cells/uL (ref 0–200)
EOS PCT: 1 %
Eosinophils Absolute: 60 cells/uL (ref 15–500)
HEMATOCRIT: 44.6 % (ref 38.5–50.0)
HEMOGLOBIN: 14.7 g/dL (ref 13.2–17.1)
LYMPHS ABS: 1680 {cells}/uL (ref 850–3900)
LYMPHS PCT: 28 %
MCH: 30.1 pg (ref 27.0–33.0)
MCHC: 33 g/dL (ref 32.0–36.0)
MCV: 91.4 fL (ref 80.0–100.0)
MONO ABS: 480 {cells}/uL (ref 200–950)
MPV: 9 fL (ref 7.5–12.5)
Monocytes Relative: 8 %
Neutro Abs: 3780 cells/uL (ref 1500–7800)
Neutrophils Relative %: 63 %
Platelets: 220 10*3/uL (ref 140–400)
RBC: 4.88 MIL/uL (ref 4.20–5.80)
RDW: 13.5 % (ref 11.0–15.0)
WBC: 6 10*3/uL (ref 3.8–10.8)

## 2016-05-01 LAB — HEPATIC FUNCTION PANEL
ALT: 17 U/L (ref 9–46)
AST: 20 U/L (ref 10–35)
Albumin: 4.7 g/dL (ref 3.6–5.1)
Alkaline Phosphatase: 41 U/L (ref 40–115)
BILIRUBIN DIRECT: 0.1 mg/dL (ref <=0.2)
BILIRUBIN INDIRECT: 0.5 mg/dL (ref 0.2–1.2)
TOTAL PROTEIN: 7.4 g/dL (ref 6.1–8.1)
Total Bilirubin: 0.6 mg/dL (ref 0.2–1.2)

## 2016-05-02 LAB — TESTOSTERONE: Testosterone: 490 ng/dL (ref 250–827)

## 2016-05-06 DIAGNOSIS — F1120 Opioid dependence, uncomplicated: Secondary | ICD-10-CM | POA: Diagnosis not present

## 2016-05-06 DIAGNOSIS — M5011 Cervical disc disorder with radiculopathy,  high cervical region: Secondary | ICD-10-CM | POA: Diagnosis not present

## 2016-05-06 DIAGNOSIS — G894 Chronic pain syndrome: Secondary | ICD-10-CM | POA: Diagnosis not present

## 2016-05-06 DIAGNOSIS — G8929 Other chronic pain: Secondary | ICD-10-CM | POA: Diagnosis not present

## 2016-05-06 DIAGNOSIS — M5001 Cervical disc disorder with myelopathy,  high cervical region: Secondary | ICD-10-CM | POA: Diagnosis not present

## 2016-05-06 DIAGNOSIS — M961 Postlaminectomy syndrome, not elsewhere classified: Secondary | ICD-10-CM | POA: Diagnosis not present

## 2016-05-06 DIAGNOSIS — M5440 Lumbago with sciatica, unspecified side: Secondary | ICD-10-CM | POA: Diagnosis not present

## 2016-05-06 DIAGNOSIS — M542 Cervicalgia: Secondary | ICD-10-CM | POA: Diagnosis not present

## 2016-05-06 DIAGNOSIS — Z79899 Other long term (current) drug therapy: Secondary | ICD-10-CM | POA: Diagnosis not present

## 2016-05-27 DIAGNOSIS — M5011 Cervical disc disorder with radiculopathy,  high cervical region: Secondary | ICD-10-CM | POA: Diagnosis not present

## 2016-05-27 DIAGNOSIS — Z79899 Other long term (current) drug therapy: Secondary | ICD-10-CM | POA: Diagnosis not present

## 2016-05-27 DIAGNOSIS — M542 Cervicalgia: Secondary | ICD-10-CM | POA: Diagnosis not present

## 2016-05-27 DIAGNOSIS — G894 Chronic pain syndrome: Secondary | ICD-10-CM | POA: Diagnosis not present

## 2016-05-27 DIAGNOSIS — F1120 Opioid dependence, uncomplicated: Secondary | ICD-10-CM | POA: Diagnosis not present

## 2016-05-27 DIAGNOSIS — M5440 Lumbago with sciatica, unspecified side: Secondary | ICD-10-CM | POA: Diagnosis not present

## 2016-05-27 DIAGNOSIS — M961 Postlaminectomy syndrome, not elsewhere classified: Secondary | ICD-10-CM | POA: Diagnosis not present

## 2016-05-27 DIAGNOSIS — G8929 Other chronic pain: Secondary | ICD-10-CM | POA: Diagnosis not present

## 2016-05-27 DIAGNOSIS — M5001 Cervical disc disorder with myelopathy,  high cervical region: Secondary | ICD-10-CM | POA: Diagnosis not present

## 2016-06-24 DIAGNOSIS — M542 Cervicalgia: Secondary | ICD-10-CM | POA: Diagnosis not present

## 2016-06-24 DIAGNOSIS — G8929 Other chronic pain: Secondary | ICD-10-CM | POA: Diagnosis not present

## 2016-06-24 DIAGNOSIS — M5001 Cervical disc disorder with myelopathy,  high cervical region: Secondary | ICD-10-CM | POA: Diagnosis not present

## 2016-06-24 DIAGNOSIS — F1120 Opioid dependence, uncomplicated: Secondary | ICD-10-CM | POA: Diagnosis not present

## 2016-06-24 DIAGNOSIS — M5011 Cervical disc disorder with radiculopathy,  high cervical region: Secondary | ICD-10-CM | POA: Diagnosis not present

## 2016-06-24 DIAGNOSIS — G894 Chronic pain syndrome: Secondary | ICD-10-CM | POA: Diagnosis not present

## 2016-06-24 DIAGNOSIS — Z79899 Other long term (current) drug therapy: Secondary | ICD-10-CM | POA: Diagnosis not present

## 2016-06-24 DIAGNOSIS — M5440 Lumbago with sciatica, unspecified side: Secondary | ICD-10-CM | POA: Diagnosis not present

## 2016-06-24 DIAGNOSIS — M961 Postlaminectomy syndrome, not elsewhere classified: Secondary | ICD-10-CM | POA: Diagnosis not present

## 2016-07-25 ENCOUNTER — Encounter: Payer: Self-pay | Admitting: Family Medicine

## 2016-07-25 ENCOUNTER — Ambulatory Visit (INDEPENDENT_AMBULATORY_CARE_PROVIDER_SITE_OTHER): Payer: Medicare HMO | Admitting: Family Medicine

## 2016-07-25 VITALS — BP 114/68 | HR 86 | Temp 98.3°F | Resp 16 | Ht 74.0 in | Wt 170.1 lb

## 2016-07-25 DIAGNOSIS — E349 Endocrine disorder, unspecified: Secondary | ICD-10-CM

## 2016-07-25 DIAGNOSIS — K219 Gastro-esophageal reflux disease without esophagitis: Secondary | ICD-10-CM | POA: Diagnosis not present

## 2016-07-25 DIAGNOSIS — N529 Male erectile dysfunction, unspecified: Secondary | ICD-10-CM | POA: Diagnosis not present

## 2016-07-25 DIAGNOSIS — F411 Generalized anxiety disorder: Secondary | ICD-10-CM

## 2016-07-25 DIAGNOSIS — H6122 Impacted cerumen, left ear: Secondary | ICD-10-CM | POA: Diagnosis not present

## 2016-07-25 MED ORDER — RANITIDINE HCL 150 MG PO TABS: 150.0000 mg | ORAL_TABLET | Freq: Two times a day (BID) | ORAL | 0 refills | Status: DC

## 2016-07-25 MED ORDER — ALPRAZOLAM 0.5 MG PO TABS: 0.5000 mg | ORAL_TABLET | Freq: Every evening | ORAL | 0 refills | Status: DC | PRN

## 2016-07-25 MED ORDER — SILDENAFIL CITRATE 100 MG PO TABS: 50.0000 mg | ORAL_TABLET | Freq: Every day | ORAL | 0 refills | Status: DC | PRN

## 2016-07-25 MED ORDER — ANDROGEL PUMP 20.25 MG/ACT (1.62%) TD GEL: TRANSDERMAL | 3 refills | Status: DC

## 2016-07-25 NOTE — Progress Notes (Signed)
Name: Larry Lin   MRN: [id number]    DOB: [birth year]   Date:07/25/2016       Progress Note  Subjective  Chief Complaint  Chief Complaint  Patient presents with  . Medication Refill    3 month F/U  . Testosterone deficiency  . Anxiety    Stable  . Gastroesophageal Reflux    Still having trouble with acid reflux  . Ear Fullness    Left ear feels clogged and hard to hear out of     Anxiety  Presents for follow-up visit. Symptoms include excessive worry and nervous/anxious behavior. Patient reports no chest pain, depressed mood, insomnia, nausea or panic. Primary symptoms comment: mom has anxiety and has been taking care of her. The severity of symptoms is moderate and causing significant distress. The quality of sleep is fair.    Gastroesophageal Reflux  He complains of heartburn. He reports no abdominal pain, no chest pain, no choking, no coughing, no nausea or no sore throat. mom has anxiety and has been taking care of her. This is a chronic problem. The problem occurs frequently. Nothing aggravates the symptoms. Past procedures do not include an EGD.  Ear Fullness   There is pain in the left ear. This is a recurrent problem. There has been no fever. The pain is at a severity of 0/10 (sensation of fullness). Associated symptoms include hearing loss. Pertinent negatives include no abdominal pain, coughing, ear discharge or sore throat. He has tried nothing for the symptoms.  Erectile Dysfunction  This is a recurrent problem. The nature of his difficulty is maintaining erection. Non-physiologic factors contributing to erectile dysfunction are anxiety. Irritative symptoms include nocturia (x1/night). Irritative symptoms do not include frequency or urgency. Obstructive symptoms do not include dribbling, incomplete emptying, an intermittent stream, a slower stream or a weak stream. Pertinent negatives include no chills. Past treatments include sildenafil. The treatment provided moderate  relief.    Past Medical History:  Diagnosis Date  . Arthritis   . GERD (gastroesophageal reflux disease)   . Low testosterone     Past Surgical History:  Procedure Laterality Date  . BACK SURGERY  2006  . diverticulitis surgery     removed some of lower intestine  . NECK SURGERY  2015  . VASECTOMY     only right side done for nerve pain    Family History  Problem Relation Age of Onset  . Kidney disease Neg Hx   . Prostate cancer Neg Hx     Social History   Social History  . Marital status: Divorced    Spouse name: N/A  . Number of children: N/A  . Years of education: N/A   Occupational History  . Not on file.   Social History Main Topics  . Smoking status: Former Smoker    Quit date: 01/22/1987  . Smokeless tobacco: Never Used  . Alcohol use 0.6 oz/week    1 Cans of beer per week     Comment: rare  . Drug use: No  . Sexual activity: Not on file   Other Topics Concern  . Not on file   Social History Narrative  . No narrative on file     Current Outpatient Prescriptions:  .  ALPRAZolam (XANAX) 0.25 MG tablet, Take 1 tablet (0.25 mg total) by mouth 2 (two) times daily as needed for anxiety., Disp: 60 tablet, Rfl: 2 .  ANDROGEL PUMP 20.25 MG/ACT (1.62%) GEL, 20.25mg /act. Apply two pumps to each shoulder qAM.,  Disp: 150 g, Rfl: 3 .  ascorbic acid (VITAMIN C) 100 MG tablet, Take 1 tablet by mouth daily., Disp: , Rfl:  .  Cyanocobalamin 100 MCG LOZG, Take 1 tablet by mouth daily., Disp: , Rfl:  .  morphine (MS CONTIN) 30 MG 12 hr tablet, Take 1 tablet by mouth 2 (two) times daily., Disp: , Rfl:  .  Multiple Vitamins-Minerals (MENS 50+ MULTI VITAMIN/MIN PO), Take 1 tablet by mouth daily., Disp: , Rfl:  .  ranitidine (ZANTAC) 150 MG tablet, Take 1 tablet (150 mg total) by mouth 2 (two) times daily., Disp: 180 tablet, Rfl: 0  No Known Allergies   Review of Systems  Constitutional: Negative for chills.  HENT: Positive for hearing loss. Negative for ear  discharge and sore throat.   Respiratory: Negative for cough and choking.   Cardiovascular: Negative for chest pain.  Gastrointestinal: Positive for heartburn. Negative for abdominal pain and nausea.  Genitourinary: Positive for nocturia (x1/night). Negative for frequency, incomplete emptying and urgency.  Psychiatric/Behavioral: The patient is nervous/anxious. The patient does not have insomnia.       Objective  Vitals:   07/25/16 1509  BP: 114/68  Pulse: 86  Resp: 16  Temp: 98.3 F (36.8 C)  TempSrc: Oral  SpO2: 96%  Weight: 170 lb 1.6 oz (77.2 kg)  Height: 6\' 2"  (1.88 m)    Physical Exam  Constitutional: He is oriented to person, place, and time and well-developed, well-nourished, and in no distress.  HENT:  Right Ear: Tympanic membrane and ear canal normal. No drainage or swelling.  Left ear canal with cerumen impaction.   Cardiovascular: Normal rate, regular rhythm and normal heart sounds.   No murmur heard. Pulmonary/Chest: Effort normal and breath sounds normal. No respiratory distress.  Abdominal: Soft. Bowel sounds are normal. There is no tenderness.  Neurological: He is alert and oriented to person, place, and time.  Psychiatric: Mood, memory, affect and judgment normal.  Nursing note and vitals reviewed.    Assessment & Plan  1. Gastroesophageal reflux disease without esophagitis Stable on Zantac taken twice daily - ranitidine (ZANTAC) 150 MG tablet; Take 1 tablet (150 mg total) by mouth 2 (two) times daily.  Dispense: 180 tablet; Refill: 0  2. Generalized anxiety disorder Symptoms of anxiety are stable, we will change to 0.5 mg daily as needed for optimal treatment - ALPRAZolam (XANAX) 0.5 MG tablet; Take 1 tablet (0.5 mg total) by mouth at bedtime as needed for anxiety.  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  Dispense: 10 tablet; Refill: 0  4. Testosterone eficiency  - ANDROGEL PUMP 20.25 MG/ACT (1.62%) GEL; 20.25mg /act. Apply two pumps to each shoulder qAM.  Dispense: 150 g; Refill: 3  5. Impacted cerumen of left ear  - Ear Lavage  Abisai Coble Asad A. Faylene KurtzShah [facility] 07/25/2016 3:25 PM

## 2016-07-26 ENCOUNTER — Other Ambulatory Visit: Payer: Self-pay | Admitting: Family Medicine

## 2016-07-26 DIAGNOSIS — N529 Male erectile dysfunction, unspecified: Secondary | ICD-10-CM

## 2016-07-29 ENCOUNTER — Other Ambulatory Visit: Payer: Self-pay | Admitting: Family Medicine

## 2016-07-29 DIAGNOSIS — G894 Chronic pain syndrome: Secondary | ICD-10-CM | POA: Diagnosis not present

## 2016-07-29 DIAGNOSIS — M542 Cervicalgia: Secondary | ICD-10-CM | POA: Diagnosis not present

## 2016-07-29 DIAGNOSIS — M5440 Lumbago with sciatica, unspecified side: Secondary | ICD-10-CM | POA: Diagnosis not present

## 2016-07-29 DIAGNOSIS — G8929 Other chronic pain: Secondary | ICD-10-CM | POA: Diagnosis not present

## 2016-07-29 DIAGNOSIS — F1120 Opioid dependence, uncomplicated: Secondary | ICD-10-CM | POA: Diagnosis not present

## 2016-07-29 DIAGNOSIS — M5011 Cervical disc disorder with radiculopathy,  high cervical region: Secondary | ICD-10-CM | POA: Diagnosis not present

## 2016-07-29 DIAGNOSIS — M5001 Cervical disc disorder with myelopathy,  high cervical region: Secondary | ICD-10-CM | POA: Diagnosis not present

## 2016-07-29 DIAGNOSIS — M961 Postlaminectomy syndrome, not elsewhere classified: Secondary | ICD-10-CM | POA: Diagnosis not present

## 2016-07-29 DIAGNOSIS — Z79899 Other long term (current) drug therapy: Secondary | ICD-10-CM | POA: Diagnosis not present

## 2016-07-29 MED ORDER — SILDENAFIL CITRATE 20 MG PO TABS: 40.0000 mg | ORAL_TABLET | Freq: Every day | ORAL | 0 refills | Status: DC | PRN

## 2016-07-29 NOTE — Progress Notes (Signed)
New Rx for generic form of viagra sent at pt's request; no nitrates on med list

## 2016-07-30 ENCOUNTER — Ambulatory Visit: Payer: Medicare HMO | Admitting: Family Medicine

## 2016-08-22 DIAGNOSIS — H5210 Myopia, unspecified eye: Secondary | ICD-10-CM | POA: Diagnosis not present

## 2016-09-09 DIAGNOSIS — M542 Cervicalgia: Secondary | ICD-10-CM | POA: Diagnosis not present

## 2016-09-09 DIAGNOSIS — G894 Chronic pain syndrome: Secondary | ICD-10-CM | POA: Diagnosis not present

## 2016-09-09 DIAGNOSIS — F1120 Opioid dependence, uncomplicated: Secondary | ICD-10-CM | POA: Diagnosis not present

## 2016-09-09 DIAGNOSIS — M5440 Lumbago with sciatica, unspecified side: Secondary | ICD-10-CM | POA: Diagnosis not present

## 2016-09-09 DIAGNOSIS — M961 Postlaminectomy syndrome, not elsewhere classified: Secondary | ICD-10-CM | POA: Diagnosis not present

## 2016-09-09 DIAGNOSIS — M5001 Cervical disc disorder with myelopathy,  high cervical region: Secondary | ICD-10-CM | POA: Diagnosis not present

## 2016-09-09 DIAGNOSIS — M5011 Cervical disc disorder with radiculopathy,  high cervical region: Secondary | ICD-10-CM | POA: Diagnosis not present

## 2016-09-09 DIAGNOSIS — G8929 Other chronic pain: Secondary | ICD-10-CM | POA: Diagnosis not present

## 2016-09-13 ENCOUNTER — Telehealth: Payer: Self-pay | Admitting: Family Medicine

## 2016-09-28 DIAGNOSIS — H5213 Myopia, bilateral: Secondary | ICD-10-CM | POA: Diagnosis not present

## 2016-10-24 ENCOUNTER — Ambulatory Visit: Payer: Medicare HMO | Admitting: Family Medicine

## 2016-10-28 DIAGNOSIS — G894 Chronic pain syndrome: Secondary | ICD-10-CM | POA: Diagnosis not present

## 2016-10-28 DIAGNOSIS — M542 Cervicalgia: Secondary | ICD-10-CM | POA: Diagnosis not present

## 2016-10-28 DIAGNOSIS — M961 Postlaminectomy syndrome, not elsewhere classified: Secondary | ICD-10-CM | POA: Diagnosis not present

## 2016-10-28 DIAGNOSIS — F1120 Opioid dependence, uncomplicated: Secondary | ICD-10-CM | POA: Diagnosis not present

## 2016-10-28 DIAGNOSIS — M5440 Lumbago with sciatica, unspecified side: Secondary | ICD-10-CM | POA: Diagnosis not present

## 2016-10-28 DIAGNOSIS — M5001 Cervical disc disorder with myelopathy,  high cervical region: Secondary | ICD-10-CM | POA: Diagnosis not present

## 2016-10-28 DIAGNOSIS — Z79899 Other long term (current) drug therapy: Secondary | ICD-10-CM | POA: Diagnosis not present

## 2016-10-28 DIAGNOSIS — M5011 Cervical disc disorder with radiculopathy,  high cervical region: Secondary | ICD-10-CM | POA: Diagnosis not present

## 2016-10-31 ENCOUNTER — Encounter: Payer: Self-pay | Admitting: Family Medicine

## 2016-10-31 ENCOUNTER — Ambulatory Visit (INDEPENDENT_AMBULATORY_CARE_PROVIDER_SITE_OTHER): Payer: Medicare HMO | Admitting: Family Medicine

## 2016-10-31 VITALS — BP 120/72 | HR 92 | Ht 74.0 in | Wt 170.3 lb

## 2016-10-31 DIAGNOSIS — F411 Generalized anxiety disorder: Secondary | ICD-10-CM | POA: Diagnosis not present

## 2016-10-31 DIAGNOSIS — E349 Endocrine disorder, unspecified: Secondary | ICD-10-CM | POA: Diagnosis not present

## 2016-10-31 DIAGNOSIS — K219 Gastro-esophageal reflux disease without esophagitis: Secondary | ICD-10-CM | POA: Diagnosis not present

## 2016-10-31 DIAGNOSIS — N529 Male erectile dysfunction, unspecified: Secondary | ICD-10-CM | POA: Diagnosis not present

## 2016-10-31 MED ORDER — RANITIDINE HCL 150 MG PO TABS: 150.0000 mg | ORAL_TABLET | Freq: Two times a day (BID) | ORAL | 0 refills | Status: DC

## 2016-10-31 MED ORDER — SILDENAFIL CITRATE 100 MG PO TABS: 50.0000 mg | ORAL_TABLET | ORAL | 2 refills | Status: DC | PRN

## 2016-10-31 MED ORDER — ANDROGEL PUMP 20.25 MG/ACT (1.62%) TD GEL: TRANSDERMAL | 3 refills | Status: DC

## 2016-10-31 MED ORDER — ALPRAZOLAM 0.5 MG PO TABS: 0.5000 mg | ORAL_TABLET | Freq: Every evening | ORAL | 2 refills | Status: DC | PRN

## 2016-10-31 NOTE — Progress Notes (Signed)
Name: Larry Lin: [id number]    DOB: December 16, 1955   Date:10/31/2016       Progress Note  Subjective  Chief Complaint  Chief Complaint  Patient presents with  . Medication Refill  . Anxiety    Anxiety  Presents for follow-up visit. Symptoms include muscle tension (Alprazolam helps relieve muscle tension in his neck) and nervous/anxious behavior. Patient reports no depressed mood, excessive worry, insomnia, nausea, panic, restlessness or suicidal ideas. The severity of symptoms is moderate and causing significant distress. The quality of sleep is fair.    Gastroesophageal Reflux  He reports no abdominal pain, no belching, no coughing, no heartburn or no nausea. This is a chronic problem. The problem has been unchanged. He has tried a PPI and a histamine-2 antagonist (feels that Zantac gives him a headache) for the symptoms.   Low Testosterone Symptoms of hypogonadism include fatigue, decreased libido, has been trying to improve his muscle mass by going to the gym. His last testosterone level was within normal range at 465, will obtain a repeat level today. He denies any symptoms of enlarged prostate, difficulty urinating, changes in appetite etc. He has erectile dysfunction and takes Viagra.   Past Medical History:  Diagnosis Date  . Arthritis   . GERD (gastroesophageal reflux disease)   . Low testosterone     Past Surgical History:  Procedure Laterality Date  . BACK SURGERY  2006  . diverticulitis surgery     removed some of lower intestine  . NECK SURGERY  2015  . VASECTOMY     only right side done for nerve pain    Family History  Problem Relation Age of Onset  . Kidney disease Neg Hx   . Prostate cancer Neg Hx     Social History   Social History  . Marital status: Divorced    Spouse name: N/A  . Number of children: N/A  . Years of education: N/A   Occupational History  . Not on file.   Social History Main Topics  . Smoking status: Former Smoker     Quit date: 01/22/1987  . Smokeless tobacco: Never Used  . Alcohol use 0.6 oz/week    1 Cans of beer per week     Comment: rare  . Drug use: No  . Sexual activity: Not on file   Other Topics Concern  . Not on file   Social History Narrative  . No narrative on file     Current Outpatient Prescriptions:  .  ALPRAZolam (XANAX) 0.5 MG tablet, Take 1 tablet (0.5 mg total) by mouth at bedtime as needed for anxiety., Disp: 30 tablet, Rfl: 0 .  ANDROGEL PUMP 20.25 MG/ACT (1.62%) GEL, 20.25mg /act. Apply two pumps to each shoulder qAM., Disp: 150 g, Rfl: 3 .  ascorbic acid (VITAMIN C) 100 MG tablet, Take 1 tablet by mouth daily., Disp: , Rfl:  .  Cyanocobalamin 100 MCG LOZG, Take 1 tablet by mouth daily., Disp: , Rfl:  .  HYSINGLA ER 30 MG T24A, TK 1 T PO ONCE D, Disp: , Rfl: 0 .  MORPHABOND ER 30 MG T12A, , Disp: , Rfl:  .  morphine (MS CONTIN) 30 MG 12 hr tablet, Take 1 tablet by mouth 2 (two) times daily., Disp: , Rfl:  .  Multiple Vitamins-Minerals (MENS 50+ MULTI VITAMIN/MIN PO), Take 1 tablet by mouth daily., Disp: , Rfl:  .  sildenafil (REVATIO) 20 MG tablet, Take 2-3 tablets (40-60 mg total) by mouth  daily as needed., Disp: 15 tablet, Rfl: 0 .  ranitidine (ZANTAC) 150 MG tablet, Take 1 tablet (150 mg total) by mouth 2 (two) times daily., Disp: 180 tablet, Rfl: 0  No Known Allergies   Review of Systems  Respiratory: Negative for cough.   Gastrointestinal: Negative for abdominal pain, heartburn and nausea.  Psychiatric/Behavioral: Negative for suicidal ideas. The patient is nervous/anxious. The patient does not have insomnia.      Objective  Vitals:   10/31/16 0940  BP: 120/72  Pulse: 92  Weight: 170 lb 4.8 oz (77.2 kg)  Height:  (1.88 m)    Physical Exam  Constitutional: He is oriented to person, place, and time and well-developed, well-nourished, and in no distress.  HENT:  Head: Normocephalic and atraumatic.  Cardiovascular: Normal rate, regular rhythm and  normal heart sounds.   No murmur heard. Pulmonary/Chest: Effort normal and breath sounds normal. He has no wheezes.  Abdominal: Soft. Bowel sounds are normal. There is tenderness in the right lower quadrant and left lower quadrant. There is no rebound.  Musculoskeletal: He exhibits no edema.  Neurological: He is alert and oriented to person, place, and time.  Psychiatric: Mood, memory, affect and judgment normal.  Nursing note and vitals reviewed.     Assessment & Plan  1. Gastroesophageal reflux disease without esophagitis Advised to continue taking Zantac, he may try Pepcid to see if it causes the same side effects, otherwise well controlled - ranitidine (ZANTAC) 150 MG tablet; Take 1 tablet (150 mg total) by mouth 2 (two) times daily.  Dispense: 180 tablet; Refill: 0  2. Generalized anxiety disorder Symptoms of anxiety are stable on alprazolam taken every night, patient compliant with controlled substances agreement and understands the dependence potential, side effects and drug interactions of benzodiazepines especially with opioids. - ALPRAZolam (XANAX) 0.5 MG tablet; Take 1 tablet (0.5 mg total) by mouth at bedtime as needed for anxiety.  Dispense: 30 tablet; Refill: 2  3. Testosterone deficiency Obtain testosterone levels along with hematocrit, PSA and liver enzymes, continue on AndroGel - ANDROGEL PUMP 20.25 MG/ACT (1.62%) GEL; 20.25mg /act. Apply two pumps to each shoulder qAM.  Dispense: 150 g; Refill: 3 - Testosterone - CBC with Differential/Platelet - PSA - Hepatic function panel  4. Erectile dysfunction, unspecified erectile dysfunction type  - sildenafil (VIAGRA) 100 MG tablet; Take 0.5 tablets (50 mg total) by mouth as needed for erectile dysfunction.  Dispense: 10 tablet; Refill: 2   [name] [facility] 10/31/2016 9:50 AM

## 2016-10-31 NOTE — Addendum Note (Signed)
Addended by: Lazar Tierce G on: 10/31/2016 02:22 PM   Modules accepted: Orders

## 2016-11-01 ENCOUNTER — Telehealth: Payer: Self-pay

## 2016-11-01 LAB — HEPATIC FUNCTION PANEL
AG RATIO: 1.5 (calc) (ref 1.0–2.5)
ALBUMIN MSPROF: 4.5 g/dL (ref 3.6–5.1)
ALT: 17 U/L (ref 9–46)
AST: 19 U/L (ref 10–35)
Alkaline phosphatase (APISO): 45 U/L (ref 40–115)
BILIRUBIN TOTAL: 0.7 mg/dL (ref 0.2–1.2)
Bilirubin, Direct: 0.1 mg/dL (ref 0.0–0.2)
Globulin: 3.1 g/dL (calc) (ref 1.9–3.7)
Indirect Bilirubin: 0.6 mg/dL (calc) (ref 0.2–1.2)
Total Protein: 7.6 g/dL (ref 6.1–8.1)

## 2016-11-01 LAB — CBC WITH DIFFERENTIAL/PLATELET
BASOS ABS: 29 {cells}/uL (ref 0–200)
Basophils Relative: 0.5 %
EOS PCT: 1 %
Eosinophils Absolute: 58 cells/uL (ref 15–500)
HEMATOCRIT: 42.9 % (ref 38.5–50.0)
HEMOGLOBIN: 14.5 g/dL (ref 13.2–17.1)
LYMPHS ABS: 1827 {cells}/uL (ref 850–3900)
MCH: 29.2 pg (ref 27.0–33.0)
MCHC: 33.8 g/dL (ref 32.0–36.0)
MCV: 86.3 fL (ref 80.0–100.0)
MPV: 9.3 fL (ref 7.5–12.5)
Monocytes Relative: 8.7 %
NEUTROS ABS: 3381 {cells}/uL (ref 1500–7800)
Neutrophils Relative %: 58.3 %
Platelets: 225 10*3/uL (ref 140–400)
RBC: 4.97 10*6/uL (ref 4.20–5.80)
RDW: 12.7 % (ref 11.0–15.0)
Total Lymphocyte: 31.5 %
WBC mixed population: 505 cells/uL (ref 200–950)
WBC: 5.8 10*3/uL (ref 3.8–10.8)

## 2016-11-01 LAB — TESTOSTERONE: Testosterone: 425 ng/dL (ref 250–827)

## 2016-11-01 NOTE — Telephone Encounter (Signed)
-----   Message from Ellyn Hack, MD sent at 11/01/2016  9:51 AM EDT ----- Testosterone level is within normal range at 4 25 ng/dL CBC shows normal white count, hemoglobin, hematocrit and platelets Hepatic function panel shows normal albumin, bilirubin, alkaline phosphatase and liver enzymes

## 2016-11-01 NOTE — Telephone Encounter (Signed)
Called pt no answer, LM for pt informing him of normal labs.

## 2016-12-02 DIAGNOSIS — M5011 Cervical disc disorder with radiculopathy,  high cervical region: Secondary | ICD-10-CM | POA: Diagnosis not present

## 2016-12-02 DIAGNOSIS — M961 Postlaminectomy syndrome, not elsewhere classified: Secondary | ICD-10-CM | POA: Diagnosis not present

## 2016-12-02 DIAGNOSIS — M5001 Cervical disc disorder with myelopathy,  high cervical region: Secondary | ICD-10-CM | POA: Diagnosis not present

## 2016-12-02 DIAGNOSIS — M542 Cervicalgia: Secondary | ICD-10-CM | POA: Diagnosis not present

## 2016-12-02 DIAGNOSIS — Z79899 Other long term (current) drug therapy: Secondary | ICD-10-CM | POA: Diagnosis not present

## 2016-12-02 DIAGNOSIS — G894 Chronic pain syndrome: Secondary | ICD-10-CM | POA: Diagnosis not present

## 2016-12-02 DIAGNOSIS — M5440 Lumbago with sciatica, unspecified side: Secondary | ICD-10-CM | POA: Diagnosis not present

## 2016-12-02 DIAGNOSIS — F1120 Opioid dependence, uncomplicated: Secondary | ICD-10-CM | POA: Diagnosis not present

## 2017-01-02 DIAGNOSIS — M5011 Cervical disc disorder with radiculopathy,  high cervical region: Secondary | ICD-10-CM | POA: Diagnosis not present

## 2017-01-02 DIAGNOSIS — F1120 Opioid dependence, uncomplicated: Secondary | ICD-10-CM | POA: Diagnosis not present

## 2017-01-02 DIAGNOSIS — Z79899 Other long term (current) drug therapy: Secondary | ICD-10-CM | POA: Diagnosis not present

## 2017-01-02 DIAGNOSIS — G894 Chronic pain syndrome: Secondary | ICD-10-CM | POA: Diagnosis not present

## 2017-01-02 DIAGNOSIS — M5001 Cervical disc disorder with myelopathy,  high cervical region: Secondary | ICD-10-CM | POA: Diagnosis not present

## 2017-01-02 DIAGNOSIS — M542 Cervicalgia: Secondary | ICD-10-CM | POA: Diagnosis not present

## 2017-01-02 DIAGNOSIS — M5440 Lumbago with sciatica, unspecified side: Secondary | ICD-10-CM | POA: Diagnosis not present

## 2017-01-02 DIAGNOSIS — M961 Postlaminectomy syndrome, not elsewhere classified: Secondary | ICD-10-CM | POA: Diagnosis not present

## 2017-01-31 ENCOUNTER — Ambulatory Visit: Payer: Medicare HMO | Admitting: Family Medicine

## 2017-02-04 ENCOUNTER — Encounter: Payer: Self-pay | Admitting: Family Medicine

## 2017-02-04 ENCOUNTER — Ambulatory Visit: Payer: Medicare HMO | Admitting: Family Medicine

## 2017-02-04 VITALS — BP 104/64 | HR 83 | Temp 97.7°F | Resp 16 | Ht 74.0 in | Wt 163.5 lb

## 2017-02-04 DIAGNOSIS — Z87438 Personal history of other diseases of male genital organs: Secondary | ICD-10-CM

## 2017-02-04 DIAGNOSIS — E349 Endocrine disorder, unspecified: Secondary | ICD-10-CM

## 2017-02-04 DIAGNOSIS — K219 Gastro-esophageal reflux disease without esophagitis: Secondary | ICD-10-CM | POA: Diagnosis not present

## 2017-02-04 DIAGNOSIS — N529 Male erectile dysfunction, unspecified: Secondary | ICD-10-CM | POA: Diagnosis not present

## 2017-02-04 DIAGNOSIS — F411 Generalized anxiety disorder: Secondary | ICD-10-CM

## 2017-02-04 MED ORDER — TADALAFIL 5 MG PO TABS: 5.0000 mg | ORAL_TABLET | Freq: Every day | ORAL | 2 refills | Status: DC | PRN

## 2017-02-04 MED ORDER — PANTOPRAZOLE SODIUM 20 MG PO TBEC: 20.0000 mg | DELAYED_RELEASE_TABLET | Freq: Every day | ORAL | 0 refills | Status: DC

## 2017-02-04 MED ORDER — ALPRAZOLAM 0.25 MG PO TABS: 0.2500 mg | ORAL_TABLET | Freq: Every evening | ORAL | 2 refills | Status: DC | PRN

## 2017-02-04 MED ORDER — ANDROGEL PUMP 20.25 MG/ACT (1.62%) TD GEL: TRANSDERMAL | 3 refills | Status: DC

## 2017-02-04 NOTE — Progress Notes (Signed)
Name: Larry Lin   MRN: [id number]    DOB: January 04, 1956   Date:02/04/2017       Progress Note  Subjective  Chief Complaint  Chief Complaint  Patient presents with  . Anxiety    Pt states that its approving but asking for a lower dose refill  . Medication Refill  . Gastroesophageal Reflux    Pt states its really often. Asking for another type of acide reflux meds     Anxiety  Presents for follow-up visit. Symptoms include depressed mood, excessive worry, insomnia, irritability, muscle tension and nervous/anxious behavior. Patient reports no panic. The severity of symptoms is moderate and causing significant distress. The quality of sleep is fair.    Gastroesophageal Reflux  He complains of dysphagia, heartburn and a sore throat. He reports no belching, no choking or no coughing. This is a recurrent problem. The problem has been gradually worsening (he notices that ranitidine does not help as much now having more frequent symptoms. ). The symptoms are aggravated by certain foods ([place] foods, spicy foods etc. ). He has tried a histamine-2 antagonist for the symptoms. The treatment provided mild relief. Past procedures include an EGD (had endoscopies done to 'stretch my throat').   Low Testosterone: He has history of low testosterone, symptoms include lack of energy, fatigue etc. He takes Androgel 81mg  every morning, this seems to work fine. No urinary problems, or other side effects   Past Medical History:  Diagnosis Date  . Arthritis   . GERD (gastroesophageal reflux disease)   . Low testosterone     Past Surgical History:  Procedure Laterality Date  . BACK SURGERY  2006  . diverticulitis surgery     removed some of lower intestine  . NECK SURGERY  2015  . VASECTOMY     only right side done for nerve pain    Family History  Problem Relation Age of Onset  . Leukemia Mother   . Healthy Father   . Kidney disease Neg Hx   . Prostate cancer Neg Hx     Social History    Socioeconomic History  . Marital status: Divorced    Spouse name: Not on file  . Number of children: Not on file  . Years of education: Not on file  . Highest education level: Not on file  Social Needs  . Financial resource strain: Not on file  . Food insecurity - worry: Not on file  . Food insecurity - inability: Not on file  . Transportation needs - medical: Not on file  . Transportation needs - non-medical: Not on file  Occupational History  . Not on file  Tobacco Use  . Smoking status: Former Smoker    Last attempt to quit: 01/22/1987    Years since quitting: 30.0  . Smokeless tobacco: Never Used  Substance and Sexual Activity  . Alcohol use: Yes    Alcohol/week: 0.6 oz    Types: 1 Cans of beer per week    Comment: rare  . Drug use: No  . Sexual activity: Yes  Other Topics Concern  . Not on file  Social History Narrative  . Not on file     Current Outpatient Medications:  .  ALPRAZolam (XANAX) 0.5 MG tablet, Take 1 tablet (0.5 mg total) by mouth at bedtime as needed for anxiety., Disp: 30 tablet, Rfl: 2 .  ANDROGEL PUMP 20.25 MG/ACT (1.62%) GEL, 20.25mg /act. Apply two pumps to each shoulder qAM., Disp: 150 g, Rfl: 3 .  ascorbic acid (VITAMIN C) 100 MG tablet, Take 1 tablet by mouth daily., Disp: , Rfl:  .  Cyanocobalamin 100 MCG LOZG, Take 1 tablet by mouth daily., Disp: , Rfl:  .  HYSINGLA ER 30 MG T24A, TK 1 T PO ONCE D, Disp: , Rfl: 0 .  morphine (MS CONTIN) 30 MG 12 hr tablet, Take 1 tablet by mouth 2 (two) times daily., Disp: , Rfl:  .  Multiple Vitamins-Minerals (MENS 50+ MULTI VITAMIN/MIN PO), Take 1 tablet by mouth daily., Disp: , Rfl:  .  sildenafil (VIAGRA) 100 MG tablet, Take 0.5 tablets (50 mg total) by mouth as needed for erectile dysfunction., Disp: 10 tablet, Rfl: 2 .  Testosterone 20.25 MG/ACT (1.62%) GEL, Apply 4 Pump topically daily., Disp: , Rfl: 3 .  MORPHABOND ER 30 MG T12A, , Disp: , Rfl:  .  ranitidine (ZANTAC) 150 MG tablet, Take 1 tablet (150  mg total) by mouth 2 (two) times daily. (Patient not taking: Reported on 02/04/2017), Disp: 180 tablet, Rfl: 0  No Known Allergies   Review of Systems  Constitutional: Positive for irritability.  HENT: Positive for sore throat.   Respiratory: Negative for cough and choking.   Gastrointestinal: Positive for dysphagia and heartburn.  Psychiatric/Behavioral: The patient is nervous/anxious and has insomnia.       Objective  Vitals:   02/04/17 1035  BP: 104/64  Pulse: 83  Resp: 16  Temp: 97.7 F (36.5 C)  TempSrc: Oral  SpO2: 96%  Weight: 163 lb 8 oz (74.2 kg)  Height: 6\' 2"  (1.88 m)    Physical Exam  Constitutional: He is oriented to person, place, and time and well-developed, well-nourished, and in no distress.  HENT:  Head: Normocephalic and atraumatic.  Cardiovascular: Normal rate, regular rhythm and normal heart sounds.  No murmur heard. Pulmonary/Chest: Effort normal and breath sounds normal. He has no wheezes.  Abdominal: Soft. Bowel sounds are normal. There is no tenderness.  Musculoskeletal: Normal range of motion. He exhibits no edema.  Neurological: He is alert and oriented to person, place, and time.  Psychiatric: Mood, memory, affect and judgment normal.  Nursing note and vitals reviewed.   Assessment & Plan  1. Generalized anxiety disorder Symptoms stable and improved, will reduce dosage of Xanax to 0.25 mg to be taken every night as needed, advised that he should seek consultation the psychiatrist for management of anxiety in the next 3 months. - ALPRAZolam (XANAX) 0.25 MG tablet; Take 1 tablet (0.25 mg total) by mouth at bedtime as needed for anxiety.  Dispense: 30 tablet; Refill: 2  2. Testosterone deficiency Continue on Andro gel, recheck levels in 3 months - ANDROGEL PUMP 20.25 MG/ACT (1.62%) GEL; 20.25mg /act. Apply two pumps to each shoulder qAM.  Dispense: 150 g; Refill: 3  3. Gastroesophageal reflux disease without esophagitis DC ranitidine  because of lack of efficacy, change to Protonix 20 mg daily - pantoprazole (PROTONIX) 20 MG tablet; Take 1 tablet (20 mg total) by mouth daily.  Dispense: 90 tablet; Refill: 0  4. Erectile dysfunction, unspecified erectile dysfunction type DC Viagra and started Cialis as needed for erectile dysfunction - tadalafil (CIALIS) 5 MG tablet; Take 1 tablet (5 mg total) by mouth daily as needed for erectile dysfunction.  Dispense: 30 tablet; Refill: 2  5. History of prostatitis Ration needs an appointment to urology for follow-up of prostatitis. - Ambulatory referral to Urology   [facility] As[facility] A. Faylene KurtzShah [facility] 02/04/2017 11:03 AM

## 2017-02-06 ENCOUNTER — Telehealth: Payer: Self-pay | Admitting: Family Medicine

## 2017-02-06 NOTE — Telephone Encounter (Signed)
Copied from CRM [id number]. Topic: Quick Communication - See Telephone Encounter >> Feb 06, 2017 12:05 PM Eston Mouldavis, Linus Weckerly B wrote: CRM for notification. See Telephone encounter for:  [facility] needs additional information  on rx for tadalafil 5mg   Ref # [id number] call back number [phone] 02/06/17.

## 2017-02-06 NOTE — Telephone Encounter (Signed)
Information has been sent to lastisha who handles our prior authorizations.

## 2017-02-14 ENCOUNTER — Encounter: Payer: Self-pay | Admitting: Family Medicine

## 2017-02-21 ENCOUNTER — Ambulatory Visit: Payer: Medicare HMO

## 2017-02-24 DIAGNOSIS — M542 Cervicalgia: Secondary | ICD-10-CM | POA: Diagnosis not present

## 2017-02-24 DIAGNOSIS — M961 Postlaminectomy syndrome, not elsewhere classified: Secondary | ICD-10-CM | POA: Diagnosis not present

## 2017-02-24 DIAGNOSIS — M5440 Lumbago with sciatica, unspecified side: Secondary | ICD-10-CM | POA: Diagnosis not present

## 2017-02-24 DIAGNOSIS — G894 Chronic pain syndrome: Secondary | ICD-10-CM | POA: Diagnosis not present

## 2017-02-24 DIAGNOSIS — M5001 Cervical disc disorder with myelopathy,  high cervical region: Secondary | ICD-10-CM | POA: Diagnosis not present

## 2017-02-24 DIAGNOSIS — F1120 Opioid dependence, uncomplicated: Secondary | ICD-10-CM | POA: Diagnosis not present

## 2017-02-24 DIAGNOSIS — Z79899 Other long term (current) drug therapy: Secondary | ICD-10-CM | POA: Diagnosis not present

## 2017-02-24 DIAGNOSIS — M5011 Cervical disc disorder with radiculopathy,  high cervical region: Secondary | ICD-10-CM | POA: Diagnosis not present

## 2017-03-04 NOTE — Telephone Encounter (Signed)
Appt resolved °

## 2017-03-07 ENCOUNTER — Encounter: Payer: Self-pay | Admitting: Family Medicine

## 2017-03-26 ENCOUNTER — Encounter: Payer: Self-pay | Admitting: Urology

## 2017-03-26 ENCOUNTER — Ambulatory Visit: Payer: Medicare HMO | Admitting: Urology

## 2017-03-26 VITALS — BP 101/65 | HR 75 | Ht 74.0 in | Wt 164.0 lb

## 2017-03-26 DIAGNOSIS — N411 Chronic prostatitis: Secondary | ICD-10-CM

## 2017-03-26 DIAGNOSIS — E349 Endocrine disorder, unspecified: Secondary | ICD-10-CM | POA: Diagnosis not present

## 2017-03-26 DIAGNOSIS — N529 Male erectile dysfunction, unspecified: Secondary | ICD-10-CM

## 2017-03-26 DIAGNOSIS — R351 Nocturia: Secondary | ICD-10-CM

## 2017-03-26 LAB — URINALYSIS, COMPLETE
BILIRUBIN UA: NEGATIVE
GLUCOSE, UA: NEGATIVE
Ketones, UA: NEGATIVE
LEUKOCYTES UA: NEGATIVE
Nitrite, UA: NEGATIVE
PROTEIN UA: NEGATIVE
RBC UA: NEGATIVE
SPEC GRAV UA: 1.02 (ref 1.005–1.030)
Urobilinogen, Ur: 0.2 mg/dL (ref 0.2–1.0)
pH, UA: 5.5 (ref 5.0–7.5)

## 2017-03-26 LAB — BLADDER SCAN AMB NON-IMAGING: SCAN RESULT: 0

## 2017-03-26 MED ORDER — TADALAFIL 5 MG PO TABS
5.0000 mg | ORAL_TABLET | Freq: Every day | ORAL | 3 refills | Status: DC | PRN
Start: 2017-03-26 — End: 2017-08-06

## 2017-03-26 NOTE — Progress Notes (Signed)
03/26/2017 1:46 PM   Larry Lin 1955-07-08 [id number]  Referring provider: Ellyn Hack, MD [address] [place], [place] [address]  Chief Complaint  Patient presents with  . Follow-up    HPI: Patient is a 62 year old Caucasian male with testosterone deficiency, ED, BPH with LU TS and nocturia who presents today for a follow up.    Testosterone deficiency He is no longer having spontaneous erections at night.  He does not have sleep apnea.   He is currently managing his testosterone deficiency with AndroGel prescribed by his PCP.  His PCP has relocated and he will need someone to manage his testosterone therapy.  He is also having difficulty getting insurance coverage for his AndroGel.     Erectile dysfunction He has mild to moderate ED.  He has been having difficulty with erections for the last few years.  His major complaints are lack of confidence to achieve an erection, lack of firmness, maintaining an erection to completion and the lack of satisfactory sexual intercourse.  His libido is diminished.   His risk factors for ED are age, testosterone deficiency and anxiety.  He denies any painful erections or curvatures with his erections.   He has tried Cialis in the past with good results, but he is finding this medication cost prohibitive.    BPH with LU TS He has severe BPH with LU TS.  His PVR is 0 mL.  His previous PVR was 0 mL.   His major complaints today are a feeling of urgency, nocturia and post void dribbling.   He has had these symptoms for several months.  He denies any dysuria, hematuria or suprapubic pain.   He also denies any recent fevers, chills, nausea or vomiting.  He does not have a family history of PCa.  The daily Cialis was helping his symptoms, but it has become cost prohibitive.  His UA is negative.  He is interested in a referral to PT.    PMH: Past Medical History:  Diagnosis Date  . Arthritis   . GERD (gastroesophageal reflux  disease)   . Low testosterone     Surgical History: Past Surgical History:  Procedure Laterality Date  . BACK SURGERY  2006  . diverticulitis surgery     removed some of lower intestine  . NECK SURGERY  2015  . VASECTOMY     only right side done for nerve pain    Home Medications:  Allergies as of 03/26/2017   No Known Allergies     Medication List        Accurate as of 03/26/17 11:59 PM. Always use your most recent med list.          ALPRAZolam 0.25 MG tablet Commonly known as:  XANAX Take 1 tablet (0.25 mg total) by mouth at bedtime as needed for anxiety.   ascorbic acid 100 MG tablet Commonly known as:  VITAMIN C Take 1 tablet by mouth daily.   Cyanocobalamin 100 MCG Lozg Take 1 tablet by mouth daily.   HYSINGLA ER 30 MG T24a Generic drug:  HYDROcodone Bitartrate ER TK 1 T PO ONCE D   MENS 50+ MULTI VITAMIN/MIN PO Take 1 tablet by mouth daily.   morphine 30 MG 12 hr tablet Commonly known as:  MS CONTIN Take 1 tablet by mouth 2 (two) times daily.   MORPHABOND ER 30 MG T12a Generic drug:  Morphine Sulfate ER   pantoprazole 20 MG tablet Commonly  known as:  PROTONIX Take 1 tablet (20 mg total) by mouth daily.   tadalafil 5 MG tablet Commonly known as:  CIALIS Take 1 tablet (5 mg total) by mouth daily as needed for erectile dysfunction.   Testosterone 20.25 MG/ACT (1.62%) Gel Apply 4 Pump topically daily.   ANDROGEL PUMP 20.25 MG/ACT (1.62%) Gel Generic drug:  Testosterone 20.25mg /act. Apply two pumps to each shoulder qAM.       Allergies: No Known Allergies  Family History: Family History  Problem Relation Age of Onset  . Leukemia Mother   . Healthy Father   . Kidney disease Neg Hx   . Prostate cancer Neg Hx     Social History:  reports that he quit smoking about 30 years ago. he has never used smokeless tobacco. He reports that he drinks about 0.6 oz of alcohol per week. He reports that he does not use drugs.  ROS: UROLOGY Frequent  Urination?: No Hard to postpone urination?: Yes Burning/pain with urination?: No Get up at night to urinate?: Yes Leakage of urine?: Yes Urine stream starts and stops?: No Trouble starting stream?: No Do you have to strain to urinate?: No Blood in urine?: No Urinary tract infection?: No Sexually transmitted disease?: No Injury to kidneys or bladder?: No Painful intercourse?: No Weak stream?: No Erection problems?: No Penile pain?: No  Gastrointestinal Nausea?: No Vomiting?: No Indigestion/heartburn?: No Diarrhea?: No Constipation?: No  Constitutional Fever: No Night sweats?: No Weight loss?: No Fatigue?: Yes  Skin Skin rash/lesions?: No Itching?: No  Eyes Blurred vision?: No Double vision?: No  Ears/Nose/Throat Sore throat?: No Sinus problems?: No  Hematologic/Lymphatic Swollen glands?: No Easy bruising?: No  Cardiovascular Leg swelling?: No Chest pain?: No  Respiratory Cough?: No Shortness of breath?: No  Endocrine Excessive thirst?: No  Musculoskeletal Back pain?: Yes Joint pain?: No  Neurological Headaches?: No Dizziness?: No  Psychologic Depression?: No Anxiety?: No  Physical Exam: BP 101/65   Pulse 75   Ht 6\' 2"  (1.88 m)   Wt 164 lb (74.4 kg)   BMI 21.06 kg/m   Constitutional: Well nourished. Alert and oriented, No acute distress. HEENT: [place] AT, moist mucus membranes. Trachea midline, no masses. Cardiovascular: No clubbing, cyanosis, or edema. Respiratory: Normal respiratory effort, no increased work of breathing. GI: Abdomen is soft, non tender, non distended, no abdominal masses. Liver and spleen not palpable.  No hernias appreciated.  Stool sample for occult testing is not indicated.   GU: No CVA tenderness.  No bladder fullness or masses.  Patient with circumcised phallus.   Urethral meatus is patent.  No penile discharge. No penile lesions or rashes. Scrotum without lesions, cysts, rashes and/or edema.  Testicles are located  scrotally bilaterally. No masses are appreciated in the testicles. Left and right epididymis are normal. Rectal: Patient with  normal sphincter tone. Anus and perineum without scarring or rashes. No rectal masses are appreciated. Prostate is approximately 35 grams, no nodules are appreciated. Seminal vesicles are normal. Skin: No rashes, bruises or suspicious lesions. Lymph: No cervical or inguinal adenopathy. Neurologic: Grossly intact, no focal deficits, moving all 4 extremities. Psychiatric: Normal mood and affect.   Laboratory Data: Lab Results  Component Value Date   WBC 5.8 10/31/2016   HGB 14.5 10/31/2016   HCT 42.9 10/31/2016   MCV 86.3 10/31/2016   PLT 225 10/31/2016    Lab Results  Component Value Date   CREATININE 1.01 11/25/2014    Lab Results  Component Value Date   TESTOSTERONE  375 03/26/2017    Lab Results  Component Value Date   HGBA1C 5.5 11/25/2014    Lab Results  Component Value Date   AST 19 10/31/2016   Lab Results  Component Value Date   ALT 17 10/31/2016   PSA    0.2 ng/mL on 04/25/2015  0.2 ng/mL on 02/13/2016   Assessment & Plan:    1. Testosterone therapy   He would like us to manage his therapy at this time Will check testosterone level at this time Prescription sent for AndroGel - will most likely need to complete PA  2. Erectile dysfunction Good result with Cialis 5 mg daily, but it is cost prohibitive Prescription sent for Cialis 5 mg daily - will most likely need to complete PA  3. BPH with LUTS/Prostatitis Continue conservative management, avoiding bladder irritants and timed voiding's Discussed PT for pelvic relaxation - patient is interested and will make the referral Script send for Cialis 5 mg daily to pharmacy PSA drawn today    4. Nocturia Not bothersome at this time   Return for pending labs.  These notes generated with voice recognition software. I apologize for typographical errors.  Michiel CowboySHANNON Aleister Lady,  PA-C  [facility] [address], [address] [place], [place] [id number] [phone]

## 2017-03-27 LAB — TESTOSTERONE: Testosterone: 375 ng/dL (ref 264–916)

## 2017-03-29 LAB — PSA: PROSTATE SPECIFIC AG, SERUM: 0.2 ng/mL (ref 0.0–4.0)

## 2017-03-29 LAB — SPECIMEN STATUS REPORT

## 2017-03-31 ENCOUNTER — Telehealth: Payer: Self-pay

## 2017-03-31 NOTE — Telephone Encounter (Signed)
Patient notified on vmail 

## 2017-03-31 NOTE — Telephone Encounter (Signed)
-----   Message from Harle BattiestShannon A McGowan, PA-C sent at 03/30/2017  6:49 PM EDT ----- Please let Mr. Larry Lin know that his PSA is stable at 0.2.

## 2017-04-07 ENCOUNTER — Telehealth: Payer: Self-pay | Admitting: Urology

## 2017-04-07 NOTE — Telephone Encounter (Signed)
Do you know the status on his PA for Cialis and AndroGel?

## 2017-04-22 ENCOUNTER — Telehealth: Payer: Self-pay

## 2017-04-22 NOTE — Telephone Encounter (Signed)
PA for cialis 5mg  DENIED! Pt needs to try and fail: flomax, alfuzosin, terazosin, and/or dutasteride.

## 2017-04-25 ENCOUNTER — Other Ambulatory Visit: Payer: Self-pay

## 2017-04-25 DIAGNOSIS — N411 Chronic prostatitis: Secondary | ICD-10-CM

## 2017-04-28 ENCOUNTER — Other Ambulatory Visit: Payer: Self-pay

## 2017-04-28 DIAGNOSIS — K219 Gastro-esophageal reflux disease without esophagitis: Secondary | ICD-10-CM

## 2017-04-28 MED ORDER — PANTOPRAZOLE SODIUM 20 MG PO TBEC: 20.0000 mg | DELAYED_RELEASE_TABLET | Freq: Every day | ORAL | 0 refills | Status: DC

## 2017-04-28 NOTE — Telephone Encounter (Signed)
Refill request for general medication. Pantoprazole to [facility].   Last office visit: 02/04/2017   Follow up on 05/20/2017

## 2017-05-01 NOTE — Telephone Encounter (Signed)
Patient has seen urology for ED  Routing Rx request to urology provider

## 2017-05-01 NOTE — Telephone Encounter (Signed)
Refill request for general medication: Cialis 5 mg  Last office visit: 02/04/2017  Last physical exam: None indicated  Follow-ups on file. 05/20/2017

## 2017-05-05 DIAGNOSIS — M5001 Cervical disc disorder with myelopathy,  high cervical region: Secondary | ICD-10-CM | POA: Diagnosis not present

## 2017-05-05 DIAGNOSIS — M5440 Lumbago with sciatica, unspecified side: Secondary | ICD-10-CM | POA: Diagnosis not present

## 2017-05-05 DIAGNOSIS — M5011 Cervical disc disorder with radiculopathy,  high cervical region: Secondary | ICD-10-CM | POA: Diagnosis not present

## 2017-05-05 DIAGNOSIS — G894 Chronic pain syndrome: Secondary | ICD-10-CM | POA: Diagnosis not present

## 2017-05-05 DIAGNOSIS — F1120 Opioid dependence, uncomplicated: Secondary | ICD-10-CM | POA: Diagnosis not present

## 2017-05-05 DIAGNOSIS — M542 Cervicalgia: Secondary | ICD-10-CM | POA: Diagnosis not present

## 2017-05-05 DIAGNOSIS — Z79899 Other long term (current) drug therapy: Secondary | ICD-10-CM | POA: Diagnosis not present

## 2017-05-05 DIAGNOSIS — M961 Postlaminectomy syndrome, not elsewhere classified: Secondary | ICD-10-CM | POA: Diagnosis not present

## 2017-05-06 ENCOUNTER — Encounter: Payer: Self-pay | Admitting: Physical Therapy

## 2017-05-06 ENCOUNTER — Ambulatory Visit: Payer: Medicare HMO | Attending: Urology | Admitting: Physical Therapy

## 2017-05-06 ENCOUNTER — Other Ambulatory Visit: Payer: Self-pay

## 2017-05-06 DIAGNOSIS — M2170 Unequal limb length (acquired), unspecified site: Secondary | ICD-10-CM | POA: Diagnosis not present

## 2017-05-06 DIAGNOSIS — M6281 Muscle weakness (generalized): Secondary | ICD-10-CM | POA: Diagnosis not present

## 2017-05-06 DIAGNOSIS — M4125 Other idiopathic scoliosis, thoracolumbar region: Secondary | ICD-10-CM | POA: Diagnosis not present

## 2017-05-06 NOTE — Telephone Encounter (Signed)
Patient's PA for the Cialis has been denied.  His insurance requires step -therapy so he has to try and fail their formulary drugs.  Is he wanting to pay out of pocket for the Cialis or try one of the step down medications.

## 2017-05-06 NOTE — Telephone Encounter (Signed)
LMOM for patient to return call.

## 2017-05-07 ENCOUNTER — Encounter: Payer: Medicare HMO | Admitting: Physical Therapy

## 2017-05-07 NOTE — Telephone Encounter (Signed)
Unable to reach patient.

## 2017-05-07 NOTE — Therapy (Signed)
[facility] MAIN [facility] SERVICES [address] [place], [place], [address] Phone: [phone]   Fax:  [phone]  Physical Therapy Evaluation  Patient Details  Name: Larry Lin MRN: [id number] Date of Birth: [birth year] Referring Provider: Marvel Plan    Encounter Date: 05/06/2017  PT End of Session - 05/07/17 1313    Visit Number  1    Number of Visits  20   Date for PT Re-Evaluation  07/30/17    PT Start Time  1505    PT Stop Time  1610    PT Time Calculation (min)  65 min       Past Medical History:  Diagnosis Date  . Arthritis   . GERD (gastroesophageal reflux disease)   . Low testosterone     Past Surgical History:  Procedure Laterality Date  . BACK SURGERY  2006  . diverticulitis surgery     removed some of lower intestine  . NECK SURGERY  2015  . VASECTOMY     only right side done for nerve pain    There were no vitals filed for this visit.   Subjective Assessment - 05/06/17 1512    Subjective  1) post-urinary dribble, pressure complaints: Pt reports getting up at night to urinate. Pt used to get up 2-3 x night and currently only 1 x night. Pt stopped drinking a couple hours before bed. Pt notices pressure in the bladder mostly at night which is aggravating to his lifestyle 7-8/10.  Pt noticed his pressure in his bladder a couple of years since 2015.  Denied radiating pain from bladder, changes to bowels.  Urinary frequency during the day:  1 x every 2 hours.  Daily water intake:  40 fl oz,  20 oz of coffee, no tea, juices, no alcohol.  Denied having strain to initate urnation / bowel movements. Pt complains of weak urine stream. Recently, pt finds it to be aggravating that he has drops after urination. Denied blood in the urine.    2) R pelvic pain occurs after activities: walking, riding. It is worst with lifting. This pain occured after resection of his colon in 2001 due to diverticultisis.   3)  CLBP and neck pain since  1980s. No injuries involved with the pain.  Neck fusion C2-3, 4-7 ( 2005, 2015) due to DDD , Lumbar fusion L4-S1 ( 2006) due to DDD.  Pt did complete PT after surgeries. Current exercises: leg/ upper body weight training, sit ups on the machine  ( 20 x for the past 18 months), walking 4 miles every other day. biking 6 miles. Pain occurs constantly with head turns and he tries to drive very little.  Morphine helps with low back pain which helps him to walk. Pt has been on opioids for atleast 15 years. ( Anesthesiologist Dr. Park Breed).  No medications he is taking has not been helping his neck pain. Pt would like to play golf and road biking.   4) Acute pain with R lateral knee started 3 days ago upon waking. Pain level 9/10. Pt recalled hitting the steering in his vehicle at this same spot .  Denied radiating pain. pt tried applying heat but it has not relieved the pain. Pain hurts with walking, laying down/R.  Pt has not notified his doctors.        Pertinent History  resection of colon for diverticulitis, R testicular swelling/ pain/ discoloration post resection.  Nerve pain remains in the R pelvic area after  resection surgery     Patient Stated Goals  "be as active as I can" , play golf          [facility] PT Assessment - 05/07/17 1521      Assessment   Medical Diagnosis  chronic prostatitis     Referring Provider  Mc[place]an       Precautions   Precautions  None      Restrictions   Weight Bearing Restrictions  No      Balance Screen   Has the patient fallen in the past 6 months  No      Observation/Other Assessments   Observations  R scapular dyskinesis ( delayed in downward rotation)       Coordination   Gross Motor Movements are Fluid and Coordinated  -- abdominal straining, dyscoordination of breathing      AROM   Overall AROM Comments  sidebend: R 15 deg, L 25 deg.  ext: 30 deg , flex 40 deg.  R rotaion 15 deg, 25 deg         Palpation   Spinal mobility  lower R thoracic convex curve      SI assessment   L iliac crest highter in stance, R iliac crest lower in supine ,R thorcic posterior rotation. L 99 cm , 98 cm on R medial malleoli to ASIS      Palpation comment  suprapubic scar from resection of colon , tenderness at R posterior pelvic floor with palpation                 Objective measurements completed on examination: See above findings.    Pelvic Floor Special Questions - 05/06/17 1551    Diastasis Recti  neg       [facility] Adult PT Treatment/Exercise - 05/07/17 1521      Ambulation/Gait   Gait velocity  1.01 m/s     Gait Comments  antalgic gait, decreased stance on L,       Therapeutic Activites    Therapeutic Activities  -- see pt instructions              PT Education - 05/07/17 1524    Education provided  Yes    Education Details  POC, anatomy/physiology, goals, HEP     Person(s) Educated  Patient    Methods  Explanation;Demonstration;Tactile cues;Verbal cues;Handout    Comprehension  Returned demonstration;Verbalized understanding          PT Long Term Goals - 05/07/17 1310      PT LONG TERM GOAL #1   Title  Pt will decrease his NIH-CPSI score from 35% to < 30% in order to restore pelvic floor function    Time  10    Period  Weeks    Status  New    Target Date  07/16/17      PT LONG TERM GOAL #2   Title  Pt will decrease ODI score from % to < % in order to perform sports and ADLs    Time  10    Period  Weeks    Status  New    Target Date  07/16/17      PT LONG TERM GOAL #3   Title  Pt will demo increased R cervical ROM with decreased R shoulder mm tensions and no delayed downward rotation of R scapular in order to progress to golfing     Time  4    Period  Weeks    Status  New    Target Date  06/04/17      PT LONG TERM GOAL #4   Title  Pt will be IND with scoliosis HEP     Time  10    Period  Weeks    Status  New    Target Date  07/16/17      PT LONG TERM GOAL #5   Title  Pt will report decreased NDI score  from 66% to < 56% in order to improve functional activities with neck     Time  10    Period  Weeks    Status  New    Target Date  07/16/17      Additional Long Term Goals   Additional Long Term Goals  Yes      PT LONG TERM GOAL #6   Title  Pt will demo decreased suprapubic scar restrictions across 2 visits in order to optimize pelvic floor lengthening for less post-urination dribble and completion of urination    Time  4    Period  Weeks    Status  New    Target Date  06/04/17             Plan - 05/07/17 1314    Clinical Impression Statement  Pt is 62 yo male who reports post-urination dribble, incomplete emptying, bladder pressure complaints, R chronic pelvic pain, chronic neck and back pain, and acute R knee pain. Pt 's clinical presentations include suprapubic scar restrictions, R tenderness w/ palpation to posterior pelvic floor, dyscoordination of deep core mm and abdominal/pelvic floor straining with activities, limited cervical and spinal mobility, leg length discrepancy, thoracolumbar scoliosis. Tx today included:  pt demo'd proper coordination of deep core mm to minimize straining at abdominal and pelvic floor mm and was provided a shoe lift to account of leg length difference and pt reported no increased pain upon wearing it in R shoe. PT plans to assess R knee pain upon next session and educated pt to contact his doctor re: this Sx if no change or worsening of Sx. Pt lives out of town but travels back to [place] area every 2 weeks which will be the frequency of his future PT sessions.       History and Personal Factors relevant to plan of care: diverticulitis w/ Tx of resection of colon, testicular/ R pelvic pain pain following colon surgery, Hx of DDD with Tx of neck and lumbar fusions, From his medical records:  Benign prostatic hypertrophy without urinary obstruction, vastectomy, Family Hx of CA ( mother-leukemia)    Clinical Presentation  Evolving    Clinical  Decision Making  Moderate    Rehab Potential  Good    PT Frequency  1x / week    PT Duration  20 weeks    PT Treatment/Interventions  Gait training;Stair training;Moist Heat;Functional mobility training;Therapeutic activities;Therapeutic exercise;Patient/family education;Neuromuscular re-education;Balance training;Manual techniques;Manual lymph drainage;Taping;Passive range of motion;Scar mobilization    Consulted and Agree with Plan of Care  Patient       Patient will benefit from skilled therapeutic intervention in order to improve the following deficits and impairments:  Abnormal gait, Pain, Improper body mechanics, Decreased mobility, Decreased coordination, Decreased strength, Decreased endurance, Decreased activity tolerance, Difficulty walking, Hypomobility, Decreased scar mobility, Increased muscle spasms, Postural dysfunction, Decreased range of motion, Decreased safety awareness  Visit Diagnosis: Leg length discrepancy  Other idiopathic scoliosis, thoracolumbar region  Muscle weakness (generalized)     Problem List Patient Active Problem List  Diagnosis Date Noted  . Chronic neck and back pain 11/30/2015  . Anxiety disorder 07/27/2015  . Carbohydrate intolerance 10/26/2014  . Low back pain 07/13/2014  . GERD (gastroesophageal reflux disease) 07/04/2014  . Benign prostatic hypertrophy without urinary obstruction 07/04/2014  . ED (erectile dysfunction) 07/04/2014  . Testosterone deficiency 07/04/2014    Mariane MastersYeung,Shin Yiing ,PT, DPT, E-RYT  05/07/2017, 4:01 PM  [facility] MAIN [facility] SERVICES [address] [place], [place], [address] Phone: [phone]   Fax:  [phone]  Name: Larry Lin MRN: [id number] Date of Birth: 01/12/1956

## 2017-05-07 NOTE — Patient Instructions (Addendum)
Deep core level 1 breathing  No pushing with abdominal muscles    Wearing of shoe lift in R shoe

## 2017-05-13 ENCOUNTER — Ambulatory Visit: Payer: Medicare HMO | Admitting: Physical Therapy

## 2017-05-20 ENCOUNTER — Ambulatory Visit: Payer: Medicare HMO | Admitting: Physical Therapy

## 2017-05-20 ENCOUNTER — Encounter: Payer: Self-pay | Admitting: Family Medicine

## 2017-05-20 ENCOUNTER — Ambulatory Visit (INDEPENDENT_AMBULATORY_CARE_PROVIDER_SITE_OTHER): Payer: Medicare HMO | Admitting: Family Medicine

## 2017-05-20 ENCOUNTER — Encounter: Payer: Medicare HMO | Admitting: Physical Therapy

## 2017-05-20 VITALS — BP 100/70 | HR 68 | Resp 14 | Ht 74.0 in | Wt 164.6 lb

## 2017-05-20 DIAGNOSIS — N411 Chronic prostatitis: Secondary | ICD-10-CM | POA: Diagnosis not present

## 2017-05-20 DIAGNOSIS — S8991XA Unspecified injury of right lower leg, initial encounter: Secondary | ICD-10-CM | POA: Diagnosis not present

## 2017-05-20 DIAGNOSIS — Z23 Encounter for immunization: Secondary | ICD-10-CM | POA: Diagnosis not present

## 2017-05-20 DIAGNOSIS — L719 Rosacea, unspecified: Secondary | ICD-10-CM | POA: Diagnosis not present

## 2017-05-20 DIAGNOSIS — G8929 Other chronic pain: Secondary | ICD-10-CM

## 2017-05-20 DIAGNOSIS — M5412 Radiculopathy, cervical region: Secondary | ICD-10-CM

## 2017-05-20 DIAGNOSIS — M542 Cervicalgia: Secondary | ICD-10-CM

## 2017-05-20 DIAGNOSIS — E349 Endocrine disorder, unspecified: Secondary | ICD-10-CM

## 2017-05-20 DIAGNOSIS — I459 Conduction disorder, unspecified: Secondary | ICD-10-CM | POA: Insufficient documentation

## 2017-05-20 DIAGNOSIS — K219 Gastro-esophageal reflux disease without esophagitis: Secondary | ICD-10-CM | POA: Diagnosis not present

## 2017-05-20 DIAGNOSIS — Z1159 Encounter for screening for other viral diseases: Secondary | ICD-10-CM

## 2017-05-20 DIAGNOSIS — M549 Dorsalgia, unspecified: Secondary | ICD-10-CM

## 2017-05-20 DIAGNOSIS — K5790 Diverticulosis of intestine, part unspecified, without perforation or abscess without bleeding: Secondary | ICD-10-CM | POA: Insufficient documentation

## 2017-05-20 MED ORDER — TESTOSTERONE 20.25 MG/ACT (1.62%) TD GEL: TRANSDERMAL | 3 refills | Status: DC

## 2017-05-20 MED ORDER — ZOSTER VAC RECOMB ADJUVANTED 50 MCG/0.5ML IM SUSR: 0.5000 mL | Freq: Once | INTRAMUSCULAR | 1 refills | Status: AC

## 2017-05-20 MED ORDER — METRONIDAZOLE 1 % EX GEL: Freq: Every day | CUTANEOUS | 0 refills | Status: DC

## 2017-05-20 NOTE — Progress Notes (Signed)
Name: Larry Lin   MRN: [id number]    DOB: 10-05-1955   Date:05/20/2017       Progress Note  Subjective  Chief Complaint  Chief Complaint  Patient presents with  . Medication Refill  . Anxiety  . Referral    Patient needs referral to Ortho, Neurosurgeon and Pain Clinic    HPI  Right knee injury: he states he twisted his right knee while going over a curb last week, he did not hear a pop, but pain was acute, unable to bear wear weight initially, and developed some effusion and redness within minutes. He tried ice and heat  and took some tylenol, swelling is down, but still needs to wear a brace to help with his gait. Pain is 8/10. Still has an antalgic gait  Chronic neck and back pain: used to get pain medication from previous neurosurgeon, but he was released and has been seeing Dr. Welton Flakes, he would like to see Dr. Pleas Koch for second opinion, and discuss cervical injections  Right cervical radiculitis: he has a history of neck surgery times 2 - fusions. He was doing well and over the past few months pain shooting down left arm, has some weakness, mild tingling and numbness and would like to go back to see Dr. Jordan Likes, his surgeon  Low testosterone level and BPH: due for labs, needs PA for androgel, supposed to be on  4 pumps daily to control symptoms of fatigue and lack of libido. He has been stretching the last bottle because needs PA and is only one pump daily we will recheck labs  Rosacea: needs refill   Patient Active Problem List   Diagnosis Date Noted  . DD (diverticular disease) 05/20/2017  . Cardiac conduction disorder 05/20/2017  . Chronic neck and back pain 11/30/2015  . Anxiety disorder 07/27/2015  . GERD (gastroesophageal reflux disease) 07/04/2014  . Benign prostatic hypertrophy without urinary obstruction 07/04/2014  . ED (erectile dysfunction) 07/04/2014  . Testosterone deficiency 07/04/2014  . Rosacea 04/23/1999    Past Surgical History:  Procedure Laterality  Date  . BACK SURGERY  2006  . diverticulitis surgery     removed some of lower intestine  . NECK SURGERY  2015  . VASECTOMY     only right side done for nerve pain    Family History  Problem Relation Age of Onset  . Leukemia Mother   . Healthy Father   . Kidney disease Neg Hx   . Prostate cancer Neg Hx     Social History   Socioeconomic History  . Marital status: Divorced    Spouse name: Not on file  . Number of children: Not on file  . Years of education: Not on file  . Highest education level: Not on file  Occupational History  . Not on file  Social Needs  . Financial resource strain: Not on file  . Food insecurity:    Worry: Not on file    Inability: Not on file  . Transportation needs:    Medical: Not on file    Non-medical: Not on file  Tobacco Use  . Smoking status: Former Smoker    Last attempt to quit: 01/22/1987    Years since quitting: 30.3  . Smokeless tobacco: Never Used  Substance and Sexual Activity  . Alcohol use: Yes    Alcohol/week: 0.6 oz    Types: 1 Cans of beer per week    Comment: rare  . Drug use: No  . Sexual  activity: Yes  Lifestyle  . Physical activity:    Days per week: Not on file    Minutes per session: Not on file  . Stress: Not on file  Relationships  . Social connections:    Talks on phone: Not on file    Gets together: Not on file    Attends religious service: Not on file    Active member of club or organization: Not on file    Attends meetings of clubs or organizations: Not on file    Relationship status: Not on file  . Intimate partner violence:    Fear of current or ex partner: Not on file    Emotionally abused: Not on file    Physically abused: Not on file    Forced sexual activity: Not on file  Other Topics Concern  . Not on file  Social History Narrative  . Not on file     Current Outpatient Medications:  .  ascorbic acid (VITAMIN C) 100 MG tablet, Take 1 tablet by mouth daily., Disp: , Rfl:  .   Cyanocobalamin 100 MCG LOZG, Take 1 tablet by mouth daily., Disp: , Rfl:  .  MORPHABOND ER 30 MG T12A, , Disp: , Rfl:  .  Multiple Vitamins-Minerals (MENS 50+ MULTI VITAMIN/MIN PO), Take 1 tablet by mouth daily., Disp: , Rfl:  .  pantoprazole (PROTONIX) 20 MG tablet, Take 1 tablet (20 mg total) by mouth daily., Disp: 90 tablet, Rfl: 0 .  tadalafil (CIALIS) 5 MG tablet, Take 1 tablet (5 mg total) by mouth daily as needed for erectile dysfunction., Disp: 90 tablet, Rfl: 3 .  Testosterone (ANDROGEL PUMP) 20.25 MG/ACT (1.62%) GEL, 20.25mg /act. Apply two pumps to each shoulder qAM., Disp: 150 g, Rfl: 3 .  gabapentin (NEURONTIN) 300 MG capsule, Take 1 capsule by mouth at bedtime as needed., Disp: , Rfl: 1 .  metroNIDAZOLE (METROGEL) 1 % gel, Apply topically daily., Disp: 45 g, Rfl: 0 .  Zoster Vaccine Adjuvanted (SHINGRIX) injection, Inject 0.5 mLs into the muscle once for 1 dose., Disp: 0.5 mL, Rfl: 1  No Known Allergies   ROS  Constitutional: Negative for fever or weight change.  Respiratory: Negative for cough and shortness of breath.   Cardiovascular: Negative for chest pain or palpitations.  Gastrointestinal: Negative for abdominal pain, no bowel changes.  Musculoskeletal: Positive or gait problem and right joint swelling.  Skin: positive  for rash. - rosacea  Neurological: Negative for dizziness or headache.  No other specific complaints in a complete review of systems (except as listed in HPI above).  Objective  Vitals:   05/20/17 1052  BP: 100/70  Pulse: 68  Resp: 14  SpO2: 95%  Weight: 164 lb 9.6 oz (74.7 kg)  Height:  (1.88 m)    Body mass index is 21.13 kg/m.  Physical Exam  Constitutional: Patient appears well-developed and well-nourished. Thin No distress.  HEENT: head atraumatic, normocephalic, pupils equal and reactive to light, throat within normal limits Cardiovascular: Normal rate, regular rhythm and normal heart sounds.  No murmur heard. No BLE  edema. Pulmonary/Chest: Effort normal and breath sounds normal. No respiratory distress. Abdominal: Soft.  There is no tenderness. Psychiatric: Patient has a normal mood and affect. behavior is normal. Judgment and thought content normal. Skin: rosacea face Muscular skeletal: antalgic gait, moving around the room to get comfortable decrease rom of neck , pain during palpation of lateral aspect of right knee, no effusion, unable to move it freely   Recent  Results (from the past 2160 hour(s))  Bladder Scan (Post Void Residual) in office     Status: None   Collection Time: 03/26/17 12:00 AM  Result Value Ref Range   Scan Result 0   Urinalysis, Complete     Status: None   Collection Time: 03/26/17 11:03 AM  Result Value Ref Range   Specific Gravity, UA 1.020 1.005 - 1.030   pH, UA 5.5 5.0 - 7.5   Color, UA Yellow Yellow   Appearance Ur Clear Clear   Leukocytes, UA Negative Negative   Protein, UA Negative Negative/Trace   Glucose, UA Negative Negative   Ketones, UA Negative Negative   RBC, UA Negative Negative   Bilirubin, UA Negative Negative   Urobilinogen, Ur 0.2 0.2 - 1.0 mg/dL   Nitrite, UA Negative Negative  PSA     Status: None   Collection Time: 03/26/17 11:49 AM  Result Value Ref Range   Prostate Specific Ag, Serum CANCELED ng/mL    Comment: Please refer to the following specimen for additional lab results. SEE:[id number] Roche ECLIA methodology. According to the American Urological Association, Serum PSA should decrease and remain at undetectable levels after radical prostatectomy. The AUA defines biochemical recurrence as an initial PSA value 0.2 ng/mL or greater followed by a subsequent confirmatory PSA value 0.2 ng/mL or greater. Values obtained with different assay methods or kits cannot be used interchangeably. Results cannot be interpreted as absolute evidence of the presence or absence of malignant disease.  Result canceled by the ancillary.   Testosterone      Status: None   Collection Time: 03/26/17  4:39 PM  Result Value Ref Range   Testosterone 375 264 - 916 ng/dL    Comment: Adult male reference interval is based on a population of healthy nonobese males (BMI <30) between 20 and 65 years old. Travison, et.al. JCEM [phone]. PMID: [id number].   PSA     Status: None   Collection Time: 03/26/17  4:39 PM  Result Value Ref Range   Prostate Specific Ag, Serum 0.2 0.0 - 4.0 ng/mL    Comment: Roche ECLIA methodology. According to the American Urological Association, Serum PSA should decrease and remain at undetectable levels after radical prostatectomy. The AUA defines biochemical recurrence as an initial PSA value 0.2 ng/mL or greater followed by a subsequent confirmatory PSA value 0.2 ng/mL or greater. Values obtained with different assay methods or kits cannot be used interchangeably. Results cannot be interpreted as absolute evidence of the presence or absence of malignant disease.   Specimen status report     Status: None   Collection Time: 03/26/17  4:39 PM  Result Value Ref Range   specimen status report Comment     Comment: Written Authorization Written Authorization Written Authorization Received. Authorization received from ORIGINAL REQ 03-29-2017 Logged by Valentina Lucks      PHQ2/9: Depression screen [facility] 2/9 02/04/2017 07/25/2016 04/30/2016 12/21/2015 11/30/2015  Decreased Interest 0 0 0 0 0  Down, Depressed, Hopeless 0 0 0 0 0  PHQ - 2 Score 0 0 0 0 0     Fall Risk: Fall Risk  05/20/2017 02/04/2017 07/25/2016 04/30/2016 12/21/2015  Falls in the past year? No No No No No     Functional Status Survey: Is the patient deaf or have difficulty hearing?: No Does the patient have difficulty seeing, even when wearing glasses/contacts?: No Does the patient have difficulty concentrating, remembering, or making decisions?: No Does the patient have difficulty walking or climbing stairs?: No  Does the patient have  difficulty dressing or bathing?: No Does the patient have difficulty doing errands alone such as visiting a doctor's office or shopping?: No    Assessment & Plan  1. Testosterone deficiency  - Lipid panel - COMPLETE METABOLIC PANEL WITH GFR - Testosterone (ANDROGEL PUMP) 20.25 MG/ACT (1.62%) GEL; 20.25mg /act. Apply two pumps to each shoulder qAM.  Dispense: 150 g; Refill: 3 - Testosterone  2. Prostatitis, chronic   3. Need for hepatitis C screening test  - Hepatitis C Antibody  4. Need for shingles vaccine  - Zoster Vaccine Adjuvanted [facility]) injection; Inject 0.5 mLs into the muscle once for 1 dose.  Dispense: 0.5 mL; Refill: 1  5. Gastroesophageal reflux disease without esophagitis  Stable at this time  6. Chronic neck and back pain  - Ambulatory referral to Pain Clinic  7. Radiculitis of left cervical region  - Ambulatory referral to Neurosurgery  8. Rosacea  - metroNIDAZOLE (METROGEL) 1 % gel; Apply topically daily.  Dispense: 45 g; Refill: 0  9. Injury of right knee, initial encounter  - Ambulatory referral to Orthopedic Surgery

## 2017-05-21 DIAGNOSIS — M1710 Unilateral primary osteoarthritis, unspecified knee: Secondary | ICD-10-CM | POA: Insufficient documentation

## 2017-05-21 DIAGNOSIS — M1711 Unilateral primary osteoarthritis, right knee: Secondary | ICD-10-CM | POA: Diagnosis not present

## 2017-05-21 DIAGNOSIS — M179 Osteoarthritis of knee, unspecified: Secondary | ICD-10-CM | POA: Insufficient documentation

## 2017-05-21 DIAGNOSIS — S86811A Strain of other muscle(s) and tendon(s) at lower leg level, right leg, initial encounter: Secondary | ICD-10-CM | POA: Diagnosis not present

## 2017-05-23 ENCOUNTER — Encounter: Payer: Medicare HMO | Admitting: Physical Therapy

## 2017-05-27 ENCOUNTER — Encounter: Payer: Self-pay | Admitting: Physical Therapy

## 2017-05-27 ENCOUNTER — Encounter: Payer: Medicare HMO | Admitting: Physical Therapy

## 2017-05-27 DIAGNOSIS — M4125 Other idiopathic scoliosis, thoracolumbar region: Secondary | ICD-10-CM

## 2017-05-27 DIAGNOSIS — M2170 Unequal limb length (acquired), unspecified site: Secondary | ICD-10-CM

## 2017-05-27 DIAGNOSIS — M6281 Muscle weakness (generalized): Secondary | ICD-10-CM

## 2017-05-27 NOTE — Therapy (Signed)
[place] [facility] MAIN [facility] SERVICES [address] [place], [place], [address] Phone: [phone]   Fax:  [phone]  Patient Details  Name: Larry Lin MRN: [id number] Date of Birth: Jun 07, 1955 Referring Provider:  Michiel Cowboy   Encounter Date: 05/27/2017  Discharge Summary  Pt completed an evaluation and has called back to cancel his appts. Pt has self-d/c at this time.   Mariane Masters ,PT, DPT, E-RYT  05/27/2017, 1:25 PM  [facility] MAIN [facility] SERVICES [address] [place], [place], [address] Phone: [phone]   Fax:  [phone]

## 2017-06-03 ENCOUNTER — Encounter: Payer: Medicare HMO | Admitting: Physical Therapy

## 2017-06-10 ENCOUNTER — Encounter: Payer: Medicare HMO | Admitting: Physical Therapy

## 2017-06-11 DIAGNOSIS — M1711 Unilateral primary osteoarthritis, right knee: Secondary | ICD-10-CM | POA: Diagnosis not present

## 2017-06-11 DIAGNOSIS — M1712 Unilateral primary osteoarthritis, left knee: Secondary | ICD-10-CM | POA: Diagnosis not present

## 2017-06-11 DIAGNOSIS — E349 Endocrine disorder, unspecified: Secondary | ICD-10-CM | POA: Diagnosis not present

## 2017-06-11 DIAGNOSIS — Z1159 Encounter for screening for other viral diseases: Secondary | ICD-10-CM | POA: Diagnosis not present

## 2017-06-11 DIAGNOSIS — Z79899 Other long term (current) drug therapy: Secondary | ICD-10-CM | POA: Diagnosis not present

## 2017-06-12 LAB — LIPID PANEL
CHOL/HDL RATIO: 4.4 (calc) (ref <5.0)
CHOLESTEROL: 237 mg/dL — AB (ref <200)
HDL: 54 mg/dL (ref >40)
LDL CHOLESTEROL (CALC): 154 mg/dL — AB
Non-HDL Cholesterol (Calc): 183 mg/dL (calc) — ABNORMAL HIGH (ref <130)
Triglycerides: 157 mg/dL — ABNORMAL HIGH (ref <150)

## 2017-06-12 LAB — COMPLETE METABOLIC PANEL WITH GFR
AG RATIO: 1.7 (calc) (ref 1.0–2.5)
ALKALINE PHOSPHATASE (APISO): 44 U/L (ref 40–115)
ALT: 11 U/L (ref 9–46)
AST: 14 U/L (ref 10–35)
Albumin: 4.9 g/dL (ref 3.6–5.1)
BILIRUBIN TOTAL: 0.7 mg/dL (ref 0.2–1.2)
BUN: 16 mg/dL (ref 7–25)
CHLORIDE: 103 mmol/L (ref 98–110)
CO2: 29 mmol/L (ref 20–32)
Calcium: 10.2 mg/dL (ref 8.6–10.3)
Creat: 0.91 mg/dL (ref 0.70–1.25)
GFR, Est African American: 106 mL/min/{1.73_m2} (ref >=60)
GFR, Est Non African American: 91 mL/min/{1.73_m2} (ref >=60)
GLOBULIN: 2.9 g/dL (ref 1.9–3.7)
Glucose, Bld: 85 mg/dL (ref 65–99)
POTASSIUM: 4.4 mmol/L (ref 3.5–5.3)
SODIUM: 140 mmol/L (ref 135–146)
Total Protein: 7.8 g/dL (ref 6.1–8.1)

## 2017-06-12 LAB — HEPATITIS C ANTIBODY
HEP C AB: NONREACTIVE
SIGNAL TO CUT-OFF: 0.07 (ref <1.00)

## 2017-06-12 LAB — TESTOSTERONE: Testosterone: 387 ng/dL (ref 250–827)

## 2017-06-17 ENCOUNTER — Encounter: Payer: Medicare HMO | Admitting: Physical Therapy

## 2017-06-24 ENCOUNTER — Encounter: Payer: Medicare HMO | Admitting: Physical Therapy

## 2017-06-30 ENCOUNTER — Ambulatory Visit: Payer: Medicare HMO | Admitting: Urology

## 2017-07-08 ENCOUNTER — Encounter: Payer: Medicare HMO | Admitting: Physical Therapy

## 2017-07-08 ENCOUNTER — Ambulatory Visit: Payer: Medicare HMO | Admitting: Urology

## 2017-07-21 ENCOUNTER — Encounter: Payer: Self-pay | Admitting: Family Medicine

## 2017-07-21 ENCOUNTER — Other Ambulatory Visit: Payer: Self-pay | Admitting: Family Medicine

## 2017-07-21 DIAGNOSIS — K219 Gastro-esophageal reflux disease without esophagitis: Secondary | ICD-10-CM

## 2017-07-22 ENCOUNTER — Other Ambulatory Visit: Payer: Self-pay | Admitting: Family Medicine

## 2017-07-22 DIAGNOSIS — K219 Gastro-esophageal reflux disease without esophagitis: Secondary | ICD-10-CM

## 2017-07-22 MED ORDER — PANTOPRAZOLE SODIUM 20 MG PO TBEC: 20.0000 mg | DELAYED_RELEASE_TABLET | Freq: Every day | ORAL | 0 refills | Status: DC

## 2017-07-22 MED ORDER — TESTOSTERONE 12.5 MG/ACT (1%) TD GEL: 12.5000 mg | Freq: Every day | TRANSDERMAL | 2 refills | Status: DC

## 2017-07-22 NOTE — Telephone Encounter (Signed)
Refill request for general medication: Protonix  20 mg  Last office visit: 05/20/2017  Last physical exam: None indicated  Follow-ups on file. 11/04/2017

## 2017-07-22 NOTE — Telephone Encounter (Signed)
Stable at last visit 05/20/2017, advised to follow up in 6 months, and has this scheduled already. 90 day supply provided.

## 2017-08-04 ENCOUNTER — Other Ambulatory Visit: Payer: Self-pay | Admitting: Family Medicine

## 2017-08-04 DIAGNOSIS — K219 Gastro-esophageal reflux disease without esophagitis: Secondary | ICD-10-CM

## 2017-08-05 NOTE — Progress Notes (Signed)
08/06/2017 2:16 PM   [name] 07/03/1955 [id number]  Referring provider: Alba Cory, MD [address] [place], [place] [address]  Chief Complaint  Patient presents with  . Follow-up    HPI: Patient is a 62 year old Caucasian male with testosterone deficiency, ED, BPH with LU TS and nocturia who presents today for a follow up.    Testosterone deficiency He is no longer having spontaneous erections at night.  He does not have sleep apnea.   He is currently managing his testosterone deficiency with AndroGel prescribed by his PCP.  His PCP has relocated and he will need someone to manage his testosterone therapy.  He is also having difficulty getting insurance coverage for his AndroGel.  His most recent testosterone was 387 in 05/2017.    Erectile dysfunction SHIM score is 21 with the Cialis which is mild ED.  He has been having difficulty with erections for the last few years.  His major complaints are lack of confidence to achieve an erection, lack of firmness, maintaining an erection to completion and the lack of satisfactory sexual intercourse.  His libido is diminished.   His risk factors for ED are age, testosterone deficiency and anxiety.  He denies any painful erections or curvatures with his erections.   He has tried Cialis in the past with good results.   SHIM    Row Name 08/06/17 1353         SHIM: Over the last 6 months:   How do you rate your confidence that you could get and keep an erection?  High     When you had erections with sexual stimulation, how often were your erections hard enough for penetration (entering your partner)?  Most Times (much more than half the time)     During sexual intercourse, how often were you able to maintain your erection after you had penetrated (entered) your partner?  Most Times (much more than half the time)     During sexual intercourse, how difficult was it to maintain your erection to completion of  intercourse?  Slightly Difficult     When you attempted sexual intercourse, how often was it satisfactory for you?  Almost Always or Always       SHIM Total Score   SHIM  21      BPH with LU TS I PSS is 9/3.   His PVR is .   His major complaints today are a feeling of urgency, nocturia and post void dribbling.   He has had these symptoms for several months.  He denies any dysuria, hematuria or suprapubic pain.   He also denies any recent fevers, chills, nausea or vomiting.  He does not have a family history of PCa.  The daily Cialis was helping his symptoms, but it has become cost prohibitive.  His UA is negative.  He has been seen and treated by PT and felt it was worthwhile.   IPSS    Row Name 08/06/17 1300         International Prostate Symptom Score   How often have you had the sensation of not emptying your bladder?  Not at All     How often have you had to urinate less than every two hours?  More than half the time     How often have you found you stopped and started again several times when you urinated?  Not at All     How often have you  found it difficult to postpone urination?  About half the time     How often have you had a weak urinary stream?  Not at All     How often have you had to strain to start urination?  Less than 1 in 5 times     How many times did you typically get up at night to urinate?  1 Time     Total IPSS Score  9       Quality of Life due to urinary symptoms   If you were to spend the rest of your life with your urinary condition just the way it is now how would you feel about that?  Mixed      PMH: Past Medical History:  Diagnosis Date  . Arthritis   . GERD (gastroesophageal reflux disease)   . Low testosterone     Surgical History: Past Surgical History:  Procedure Laterality Date  . BACK SURGERY  2006  . diverticulitis surgery     removed some of lower intestine  . NECK SURGERY  2015  . VASECTOMY     only right side done for nerve pain     Home Medications:  Allergies as of 08/06/2017   No Known Allergies     Medication List        Accurate as of 08/06/17  2:16 PM. Always use your most recent med list.          ascorbic acid 100 MG tablet Commonly known as:  VITAMIN C Take 1 tablet by mouth daily.   Cyanocobalamin 100 MCG Lozg Take 1 tablet by mouth daily.   gabapentin 300 MG capsule Commonly known as:  NEURONTIN Take 1 capsule by mouth at bedtime as needed.   MENS 50+ MULTI VITAMIN/MIN PO Take 1 tablet by mouth daily.   metroNIDAZOLE 1 % gel Commonly known as:  METROGEL Apply topically daily.   mirabegron ER 25 MG Tb24 tablet Commonly known as:  MYRBETRIQ Take 1 tablet (25 mg total) by mouth daily.   MORPHABOND ER 30 MG T12a Generic drug:  Morphine Sulfate ER   pantoprazole 20 MG tablet Commonly known as:  PROTONIX Take 1 tablet (20 mg total) by mouth daily.   tadalafil 5 MG tablet Commonly known as:  CIALIS Take 1 tablet (5 mg total) by mouth daily as needed for erectile dysfunction.   Testosterone 12.5 MG/ACT (1%) Gel Place 12.5 mg onto the skin daily.       Allergies: No Known Allergies  Family History: Family History  Problem Relation Age of Onset  . Leukemia Mother   . Healthy Father   . Kidney disease Neg Hx   . Prostate cancer Neg Hx     Social History:  reports that he quit smoking about 30 years ago. He has never used smokeless tobacco. He reports that he drinks about 0.6 oz of alcohol per week. He reports that he does not use drugs.  ROS: UROLOGY Frequent Urination?: Yes Hard to postpone urination?: Yes Burning/pain with urination?: No Get up at night to urinate?: Yes Leakage of urine?: No Urine stream starts and stops?: No Trouble starting stream?: No Do you have to strain to urinate?: No Blood in urine?: No Urinary tract infection?: No Sexually transmitted disease?: No Injury to kidneys or bladder?: No Painful intercourse?: No Weak stream?: No Erection  problems?: No Penile pain?: No  Gastrointestinal Nausea?: No Vomiting?: No Indigestion/heartburn?: Yes Diarrhea?: No Constipation?: No  Constitutional Fever: No Night sweats?:  No Weight loss?: Yes Fatigue?: Yes  Skin Skin rash/lesions?: No Itching?: No  Eyes Blurred vision?: Yes Double vision?: No  Ears/Nose/Throat Sore throat?: No Sinus problems?: No  Hematologic/Lymphatic Swollen glands?: No Easy bruising?: No  Cardiovascular Leg swelling?: No Chest pain?: No  Respiratory Cough?: No Shortness of breath?: No  Endocrine Excessive thirst?: No  Musculoskeletal Back pain?: Yes Joint pain?: Yes  Neurological Headaches?: Yes Dizziness?: No  Psychologic Depression?: No Anxiety?: Yes  Physical Exam: BP 101/66   Pulse 71   Ht 6\' 2"  (1.88 m)   Wt 164 lb (74.4 kg)   BMI 21.06 kg/m   Constitutional: Well nourished. Alert and oriented, No acute distress. HEENT: [place] AT, moist mucus membranes. Trachea midline, no masses. Cardiovascular: No clubbing, cyanosis, or edema. Respiratory: Normal respiratory effort, no increased work of breathing. GI: Abdomen is soft, non tender, non distended, no abdominal masses. Liver and spleen not palpable.  No hernias appreciated.  Stool sample for occult testing is not indicated.   GU: No CVA tenderness.  No bladder fullness or masses.  Patient with circumcised phallus.  Urethral meatus is patent.  No penile discharge. No penile lesions or rashes. Scrotum without lesions, cysts, rashes and/or edema.  Testicles are located scrotally bilaterally. No masses are appreciated in the testicles. Left and right epididymis are normal. Rectal: Patient with  normal sphincter tone. Anus and perineum without scarring or rashes. No rectal masses are appreciated. Prostate is approximately 35 grams, no nodules are appreciated. Seminal vesicles are normal. Skin: No rashes, bruises or suspicious lesions. Lymph: No cervical or inguinal  adenopathy. Neurologic: Grossly intact, no focal deficits, moving all 4 extremities. Psychiatric: Normal mood and affect.  Laboratory Data: Lab Results  Component Value Date   WBC 5.8 10/31/2016   HGB 14.5 10/31/2016   HCT 42.9 10/31/2016   MCV 86.3 10/31/2016   PLT 225 10/31/2016    Lab Results  Component Value Date   CREATININE 0.91 06/11/2017    Lab Results  Component Value Date   TESTOSTERONE 387 06/11/2017    Lab Results  Component Value Date   HGBA1C 5.5 11/25/2014    Lab Results  Component Value Date   AST 14 06/11/2017   Lab Results  Component Value Date   ALT 11 06/11/2017   PSA     0.2 ng/mL on 04/25/2015  0.2 ng/mL on 02/13/2016  0.2 ng/mL on 03/26/2017  I have reviewed the labs.     Assessment & Plan:    1. Testosterone therapy   Prescription sent for AndroGel 1% RTC in 3 weeks for testosterone level  2. Erectile dysfunction Good result with Cialis 5 mg daily Prescription sent for Cialis 5 mg daily  3. BPH with LUTS/Prostatitis Continue conservative management, avoiding bladder irritants and timed voiding's Most bothersome symptom is frequency Completed PT Script send for Cialis 5 mg daily to pharmacy PSA in 3 weeks  4. Frequency offered medical therapy with anticholinergic therapy or beta-3 adrenergic receptor agonist and the potential side effects of each therapy - would like to try the beta-3 adrenergic receptor agonist (Myrbetriq).  Given Myrbetriq 25 mg samples, #28.  I have reviewed with the patient of the side effects of Myrbetriq, such as: elevation in BP, urinary retention and/or HA.   RTC in 3 weeks for PVR and I PSS  Return in about 3 weeks (around 08/27/2017) for testosterone before 9 am and PSA , IPSS and PVR.  These notes generated with voice recognition software. I apologize  for typographical errors.  Michiel Cowboy, PA-C  [facility] [address] [place], [place] [address] [phone]

## 2017-08-06 ENCOUNTER — Ambulatory Visit (INDEPENDENT_AMBULATORY_CARE_PROVIDER_SITE_OTHER): Payer: Medicare HMO | Admitting: Urology

## 2017-08-06 ENCOUNTER — Other Ambulatory Visit: Payer: Self-pay

## 2017-08-06 ENCOUNTER — Encounter: Payer: Self-pay | Admitting: Urology

## 2017-08-06 VITALS — BP 101/66 | HR 71 | Ht 74.0 in | Wt 164.0 lb

## 2017-08-06 DIAGNOSIS — N401 Enlarged prostate with lower urinary tract symptoms: Secondary | ICD-10-CM | POA: Diagnosis not present

## 2017-08-06 DIAGNOSIS — R350 Frequency of micturition: Secondary | ICD-10-CM | POA: Diagnosis not present

## 2017-08-06 DIAGNOSIS — N529 Male erectile dysfunction, unspecified: Secondary | ICD-10-CM | POA: Diagnosis not present

## 2017-08-06 DIAGNOSIS — N138 Other obstructive and reflux uropathy: Secondary | ICD-10-CM

## 2017-08-06 DIAGNOSIS — E349 Endocrine disorder, unspecified: Secondary | ICD-10-CM

## 2017-08-06 MED ORDER — TADALAFIL 5 MG PO TABS: 5.0000 mg | ORAL_TABLET | Freq: Every day | ORAL | 3 refills | Status: DC | PRN

## 2017-08-06 MED ORDER — TESTOSTERONE 12.5 MG/ACT (1%) TD GEL: 12.5000 mg | Freq: Every day | TRANSDERMAL | 5 refills | Status: DC

## 2017-08-06 MED ORDER — MIRABEGRON ER 25 MG PO TB24: 25.0000 mg | ORAL_TABLET | Freq: Every day | ORAL | 0 refills | Status: DC

## 2017-08-07 ENCOUNTER — Telehealth: Payer: Self-pay

## 2017-08-07 ENCOUNTER — Other Ambulatory Visit: Payer: Self-pay | Admitting: Family Medicine

## 2017-08-07 DIAGNOSIS — K219 Gastro-esophageal reflux disease without esophagitis: Secondary | ICD-10-CM

## 2017-08-07 LAB — HEMOGLOBIN: Hemoglobin: 13.5 g/dL (ref 13.0–17.7)

## 2017-08-07 LAB — HEMATOCRIT: HEMATOCRIT: 39.8 % (ref 37.5–51.0)

## 2017-08-07 NOTE — Telephone Encounter (Signed)
-----   Message from Harle BattiestShannon A McGowan, PA-C sent at 08/07/2017  7:53 AM EDT ----- Would you let Mr. Larry Lin know that his Hbg and HCT are trending downward?  He needs to speak with his PCP regarding this issue.  He needs to have follow up to make sure there isn't a cancer responsible for this trend.

## 2017-08-08 MED ORDER — PANTOPRAZOLE SODIUM 20 MG PO TBEC: 20.0000 mg | DELAYED_RELEASE_TABLET | Freq: Every day | ORAL | 0 refills | Status: DC

## 2017-08-11 ENCOUNTER — Other Ambulatory Visit: Payer: Self-pay | Admitting: Family Medicine

## 2017-08-11 DIAGNOSIS — K219 Gastro-esophageal reflux disease without esophagitis: Secondary | ICD-10-CM

## 2017-08-11 NOTE — Telephone Encounter (Signed)
Please call [facility] and ask if they carry 20mg  tablets. If not I will change Rx to 40mg  tablets. Thanks!

## 2017-08-13 ENCOUNTER — Other Ambulatory Visit: Payer: Self-pay | Admitting: Family Medicine

## 2017-08-13 DIAGNOSIS — K219 Gastro-esophageal reflux disease without esophagitis: Secondary | ICD-10-CM

## 2017-08-15 ENCOUNTER — Other Ambulatory Visit: Payer: Self-pay | Admitting: Family Medicine

## 2017-08-15 DIAGNOSIS — K219 Gastro-esophageal reflux disease without esophagitis: Secondary | ICD-10-CM

## 2017-08-16 MED ORDER — PANTOPRAZOLE SODIUM 20 MG PO TBEC: 20.0000 mg | DELAYED_RELEASE_TABLET | Freq: Every day | ORAL | 0 refills | Status: DC

## 2017-08-21 ENCOUNTER — Other Ambulatory Visit: Payer: Self-pay | Admitting: Family Medicine

## 2017-08-21 DIAGNOSIS — K219 Gastro-esophageal reflux disease without esophagitis: Secondary | ICD-10-CM

## 2017-08-21 NOTE — Telephone Encounter (Signed)
Called pt and left message to call his local [facility] to have Protonix transferred

## 2017-08-21 NOTE — Telephone Encounter (Signed)
[facility] [address] on 08/16/17 with 90 pills. Since the patient is out of town he can request a transfer of this [facility] [place]. Due to it being the same company and not a controlled substance.

## 2017-08-21 NOTE — Telephone Encounter (Signed)
Copied from CRM [phone]#[id number]. Topic: Quick Communication - Rx Refill/Question >> Aug 21, 2017 11:19 AM Raquel SarnaHayes, Teresa G wrote: pantoprazole (PROTONIX) 20 MG tablet  Pt is out of Rx please fill asap  [id number] - [place] - [address] AT [facility] [address] [place] [id number] Phone: [phone] Fax: [phone]

## 2017-09-03 ENCOUNTER — Other Ambulatory Visit: Payer: Medicare HMO

## 2017-09-03 ENCOUNTER — Ambulatory Visit: Payer: Medicare HMO | Admitting: Urology

## 2017-09-03 DIAGNOSIS — M5440 Lumbago with sciatica, unspecified side: Secondary | ICD-10-CM | POA: Diagnosis not present

## 2017-09-03 DIAGNOSIS — M5001 Cervical disc disorder with myelopathy,  high cervical region: Secondary | ICD-10-CM | POA: Diagnosis not present

## 2017-09-03 DIAGNOSIS — M542 Cervicalgia: Secondary | ICD-10-CM | POA: Diagnosis not present

## 2017-09-03 DIAGNOSIS — G894 Chronic pain syndrome: Secondary | ICD-10-CM | POA: Diagnosis not present

## 2017-09-03 DIAGNOSIS — Z79899 Other long term (current) drug therapy: Secondary | ICD-10-CM | POA: Diagnosis not present

## 2017-09-03 DIAGNOSIS — F1120 Opioid dependence, uncomplicated: Secondary | ICD-10-CM | POA: Diagnosis not present

## 2017-09-03 DIAGNOSIS — M961 Postlaminectomy syndrome, not elsewhere classified: Secondary | ICD-10-CM | POA: Diagnosis not present

## 2017-09-03 DIAGNOSIS — M5011 Cervical disc disorder with radiculopathy,  high cervical region: Secondary | ICD-10-CM | POA: Diagnosis not present

## 2017-09-04 ENCOUNTER — Other Ambulatory Visit: Payer: Self-pay | Admitting: Family Medicine

## 2017-09-04 ENCOUNTER — Other Ambulatory Visit: Payer: Medicare HMO

## 2017-09-04 DIAGNOSIS — E349 Endocrine disorder, unspecified: Secondary | ICD-10-CM

## 2017-09-04 DIAGNOSIS — N401 Enlarged prostate with lower urinary tract symptoms: Secondary | ICD-10-CM | POA: Diagnosis not present

## 2017-09-04 DIAGNOSIS — N138 Other obstructive and reflux uropathy: Secondary | ICD-10-CM

## 2017-09-05 LAB — PSA: PROSTATE SPECIFIC AG, SERUM: 0.1 ng/mL (ref 0.0–4.0)

## 2017-09-05 LAB — TESTOSTERONE: TESTOSTERONE: 437 ng/dL (ref 264–916)

## 2017-09-29 ENCOUNTER — Ambulatory Visit: Payer: Medicare HMO | Admitting: Urology

## 2017-10-20 DIAGNOSIS — M542 Cervicalgia: Secondary | ICD-10-CM | POA: Diagnosis not present

## 2017-10-20 DIAGNOSIS — M5001 Cervical disc disorder with myelopathy,  high cervical region: Secondary | ICD-10-CM | POA: Diagnosis not present

## 2017-10-20 DIAGNOSIS — M961 Postlaminectomy syndrome, not elsewhere classified: Secondary | ICD-10-CM | POA: Diagnosis not present

## 2017-10-20 DIAGNOSIS — G894 Chronic pain syndrome: Secondary | ICD-10-CM | POA: Diagnosis not present

## 2017-10-20 DIAGNOSIS — M5011 Cervical disc disorder with radiculopathy,  high cervical region: Secondary | ICD-10-CM | POA: Diagnosis not present

## 2017-10-20 DIAGNOSIS — Z79899 Other long term (current) drug therapy: Secondary | ICD-10-CM | POA: Diagnosis not present

## 2017-10-20 DIAGNOSIS — M5440 Lumbago with sciatica, unspecified side: Secondary | ICD-10-CM | POA: Diagnosis not present

## 2017-10-20 DIAGNOSIS — F1120 Opioid dependence, uncomplicated: Secondary | ICD-10-CM | POA: Diagnosis not present

## 2017-11-04 ENCOUNTER — Ambulatory Visit: Payer: Medicare HMO | Admitting: Family Medicine

## 2017-11-24 ENCOUNTER — Ambulatory Visit: Payer: Medicare HMO | Admitting: Family Medicine

## 2017-12-01 ENCOUNTER — Ambulatory Visit (INDEPENDENT_AMBULATORY_CARE_PROVIDER_SITE_OTHER): Payer: Medicare HMO | Admitting: Family Medicine

## 2017-12-01 ENCOUNTER — Encounter: Payer: Self-pay | Admitting: Family Medicine

## 2017-12-01 VITALS — BP 122/72 | HR 77 | Temp 98.3°F | Ht 73.25 in | Wt 154.0 lb

## 2017-12-01 DIAGNOSIS — M961 Postlaminectomy syndrome, not elsewhere classified: Secondary | ICD-10-CM | POA: Diagnosis not present

## 2017-12-01 DIAGNOSIS — M549 Dorsalgia, unspecified: Secondary | ICD-10-CM | POA: Diagnosis not present

## 2017-12-01 DIAGNOSIS — M5440 Lumbago with sciatica, unspecified side: Secondary | ICD-10-CM | POA: Diagnosis not present

## 2017-12-01 DIAGNOSIS — M542 Cervicalgia: Secondary | ICD-10-CM | POA: Diagnosis not present

## 2017-12-01 DIAGNOSIS — Z114 Encounter for screening for human immunodeficiency virus [HIV]: Secondary | ICD-10-CM | POA: Diagnosis not present

## 2017-12-01 DIAGNOSIS — Z7689 Persons encountering health services in other specified circumstances: Secondary | ICD-10-CM

## 2017-12-01 DIAGNOSIS — G8929 Other chronic pain: Secondary | ICD-10-CM | POA: Diagnosis not present

## 2017-12-01 DIAGNOSIS — G894 Chronic pain syndrome: Secondary | ICD-10-CM | POA: Diagnosis not present

## 2017-12-01 DIAGNOSIS — Z79899 Other long term (current) drug therapy: Secondary | ICD-10-CM | POA: Diagnosis not present

## 2017-12-01 DIAGNOSIS — M5001 Cervical disc disorder with myelopathy,  high cervical region: Secondary | ICD-10-CM | POA: Diagnosis not present

## 2017-12-01 DIAGNOSIS — K219 Gastro-esophageal reflux disease without esophagitis: Secondary | ICD-10-CM | POA: Diagnosis not present

## 2017-12-01 DIAGNOSIS — R634 Abnormal weight loss: Secondary | ICD-10-CM

## 2017-12-01 DIAGNOSIS — F1120 Opioid dependence, uncomplicated: Secondary | ICD-10-CM | POA: Diagnosis not present

## 2017-12-01 DIAGNOSIS — M5011 Cervical disc disorder with radiculopathy,  high cervical region: Secondary | ICD-10-CM | POA: Diagnosis not present

## 2017-12-01 LAB — CBC WITH DIFFERENTIAL/PLATELET
BASOS PCT: 0.3 % (ref 0.0–3.0)
Basophils Absolute: 0 10*3/uL (ref 0.0–0.1)
EOS ABS: 0.1 10*3/uL (ref 0.0–0.7)
EOS PCT: 0.9 % (ref 0.0–5.0)
HCT: 41.9 % (ref 39.0–52.0)
HEMOGLOBIN: 14.4 g/dL (ref 13.0–17.0)
LYMPHS ABS: 1.8 10*3/uL (ref 0.7–4.0)
Lymphocytes Relative: 30.3 % (ref 12.0–46.0)
MCHC: 34.2 g/dL (ref 30.0–36.0)
MCV: 88.1 fl (ref 78.0–100.0)
MONO ABS: 0.5 10*3/uL (ref 0.1–1.0)
Monocytes Relative: 7.8 % (ref 3.0–12.0)
NEUTROS ABS: 3.7 10*3/uL (ref 1.4–7.7)
NEUTROS PCT: 60.7 % (ref 43.0–77.0)
PLATELETS: 228 10*3/uL (ref 150.0–400.0)
RBC: 4.76 Mil/uL (ref 4.22–5.81)
RDW: 13 % (ref 11.5–15.5)
WBC: 6.1 10*3/uL (ref 4.0–10.5)

## 2017-12-01 LAB — COMPREHENSIVE METABOLIC PANEL
ALBUMIN: 5 g/dL (ref 3.5–5.2)
ALT: 11 U/L (ref 0–53)
AST: 14 U/L (ref 0–37)
Alkaline Phosphatase: 45 U/L (ref 39–117)
BUN: 20 mg/dL (ref 6–23)
CHLORIDE: 101 meq/L (ref 96–112)
CO2: 30 meq/L (ref 19–32)
CREATININE: 0.95 mg/dL (ref 0.40–1.50)
Calcium: 10.1 mg/dL (ref 8.4–10.5)
GFR: 85.63 mL/min (ref >60.00)
Glucose, Bld: 92 mg/dL (ref 70–99)
Potassium: 4.4 mEq/L (ref 3.5–5.1)
SODIUM: 139 meq/L (ref 135–145)
Total Bilirubin: 0.7 mg/dL (ref 0.2–1.2)
Total Protein: 8.1 g/dL (ref 6.0–8.3)

## 2017-12-01 LAB — TSH: TSH: 0.68 u[IU]/mL (ref 0.35–4.50)

## 2017-12-01 MED ORDER — PANTOPRAZOLE SODIUM 20 MG PO TBEC: 20.0000 mg | DELAYED_RELEASE_TABLET | Freq: Every day | ORAL | 1 refills | Status: DC

## 2017-12-01 NOTE — Progress Notes (Signed)
Subjective:    Patient ID: Larry Lin, male    DOB: Oct 17, 1955, 62 y.o.   MRN: [id number]  HPI This is a 62 yo male who presents today to establish care. Lives by himself. 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 His mother died at the beginning of 06/21/17, he took care of her. She had leukemia.    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  No known precipitating event for pain. Has seen neurosurgery in past, requests referral to go back. Currently seeing pain management, requests referral to change to different provider as his current provider is doing more inpatient work and is not readily available. He goes to the gym/pt several times a week. Has noticed significant decrease in muscle mass/exercise tolerance over the last year. He attributes this in part to having to stop his Andro gel because it was unaffordable.   Has lost 15-20 pounds in last year. Decreased appetite, food not as palatable. Reflux well controlled with protonix. No cough, no night sweats, no fever. Has noticed decreased muscle mass, exercise tolerance.   Has low testosterone. Was on topical androgel but can't afford and had to stop.   Hgb/hct- was told by urology that they were trending down and he needed follow up. Labs WNL per chart review (hgb/hct 13.5/39.8 on 08/06/17).   Last CPE- unknown PSA- followed by urology Colonoscopy- about 10 years ago, ? Provider in [place] Tdap- unknown Flu- declines Dental- not regular, last visit over 1 year ago Eye- 8/19 Exercise- goes to gym, has PT for nerve pain  ROS- per HPI and he denies abdominal pain, chest pain, SOB  Past Medical History:  Diagnosis Date  . Arthritis   . GERD (gastroesophageal reflux disease)   . Low testosterone    Past Surgical History:  Procedure Laterality Date  . BACK SURGERY  06-21-04  . diverticulitis surgery     removed some of lower intestine  . NECK SURGERY  06/21/13  . VASECTOMY     only right side done for nerve pain   Family History    Problem Relation Age of Onset  . Leukemia Mother   . Healthy Father   . Kidney disease Neg Hx   . Prostate cancer Neg Hx    Social History   Tobacco Use  . Smoking status: Former Smoker    Last attempt to quit: 01/22/1987    Years since quitting: 30.8  . Smokeless tobacco: Never Used  Substance Use Topics  . Alcohol use: Yes    Alcohol/week: 1.0 standard drinks    Types: 1 Cans of beer per week    Comment: rare  . Drug use: No     Review of Systems Per HPI    Objective:   Physical Exam  Constitutional: He is oriented to person, place, and time. No distress.  Thin  HENT:  Head: Normocephalic and atraumatic.  Eyes: Conjunctivae are normal.  Cardiovascular: Normal rate, regular rhythm and normal heart sounds.  Pulmonary/Chest: Effort normal.  Musculoskeletal: He exhibits no edema.  Hands contracted at rest. Able to move all extremities. Generalized muscle atrophy.   Neurological: He is alert and oriented to person, place, and time.  Skin: Skin is warm and dry. He is not diaphoretic.  Psychiatric: He has a normal mood and affect. His behavior is normal. Judgment and thought content normal.  Vitals reviewed.     BP 122/72 (BP Location: Left Arm, Patient Position: Sitting, Cuff Size: Normal)  Pulse 77   Temp 98.3 F (36.8 C) (Oral)   Ht 6' 1.25" (1.861 m)   Wt 154 lb (69.9 kg)   SpO2 97%   BMI 20.18 kg/m  Wt Readings from Last 3 Encounters:  12/01/17 154 lb (69.9 kg)  08/06/17 164 lb (74.4 kg)  05/20/17 164 lb 9.6 oz (74.7 kg)       Assessment & Plan:  1. Encounter to establish care - available records on EMR reviewed - follow up for CPE, update of health maintenance in 6 months  2. Chronic neck and back pain - Ambulatory referral to Neurosurgery - Ambulatory referral to Pain Clinic  3. Gastroesophageal reflux disease without esophagitis - pantoprazole (PROTONIX) 20 MG tablet; Take 1 tablet (20 mg total) by mouth daily.  Dispense: 90 tablet; Refill:  1  4. Screening for HIV without presence of risk factors - HIV Antibody (routine testing w rflx)  5. Weight loss, unintentional - CBC with Differential - Comprehensive metabolic panel - TSH   Olean Ree, FNP-BC  [facility], [facility]  12/04/2017 8:02 AM

## 2017-12-01 NOTE — Patient Instructions (Addendum)
Please send me your vit B12 dosage level and record if you have it for your Tetanus/ Tdap  Stop at lab and see one of the referral coordinators  Follow up in 6 months for your complete physical

## 2017-12-02 ENCOUNTER — Encounter: Payer: Self-pay | Admitting: Family Medicine

## 2017-12-02 LAB — HIV ANTIBODY (ROUTINE TESTING W REFLEX): HIV 1&2 Ab, 4th Generation: NONREACTIVE

## 2017-12-05 ENCOUNTER — Encounter: Payer: Self-pay | Admitting: Family Medicine

## 2017-12-07 ENCOUNTER — Other Ambulatory Visit: Payer: Self-pay | Admitting: Family Medicine

## 2017-12-25 NOTE — Telephone Encounter (Signed)
Festus BarrenKianna with [facility] called to [facility], fax # [phone]. Shirlee LimerickMarion received records and will get to Harlin Heys Gessner FNP today. FYI to Harlin Heys Gessner FNP.

## 2018-01-05 ENCOUNTER — Ambulatory Visit: Payer: Medicare HMO | Admitting: Family Medicine

## 2018-01-06 ENCOUNTER — Other Ambulatory Visit: Payer: Self-pay | Admitting: Family Medicine

## 2018-01-06 DIAGNOSIS — K219 Gastro-esophageal reflux disease without esophagitis: Secondary | ICD-10-CM

## 2018-01-26 DIAGNOSIS — M5011 Cervical disc disorder with radiculopathy,  high cervical region: Secondary | ICD-10-CM | POA: Diagnosis not present

## 2018-01-26 DIAGNOSIS — Z79899 Other long term (current) drug therapy: Secondary | ICD-10-CM | POA: Diagnosis not present

## 2018-01-26 DIAGNOSIS — F1120 Opioid dependence, uncomplicated: Secondary | ICD-10-CM | POA: Diagnosis not present

## 2018-01-26 DIAGNOSIS — G894 Chronic pain syndrome: Secondary | ICD-10-CM | POA: Diagnosis not present

## 2018-01-26 DIAGNOSIS — M961 Postlaminectomy syndrome, not elsewhere classified: Secondary | ICD-10-CM | POA: Diagnosis not present

## 2018-01-26 DIAGNOSIS — M5440 Lumbago with sciatica, unspecified side: Secondary | ICD-10-CM | POA: Diagnosis not present

## 2018-03-23 DIAGNOSIS — M542 Cervicalgia: Secondary | ICD-10-CM | POA: Diagnosis not present

## 2018-03-23 DIAGNOSIS — M961 Postlaminectomy syndrome, not elsewhere classified: Secondary | ICD-10-CM | POA: Diagnosis not present

## 2018-03-23 DIAGNOSIS — F1120 Opioid dependence, uncomplicated: Secondary | ICD-10-CM | POA: Diagnosis not present

## 2018-03-23 DIAGNOSIS — M5011 Cervical disc disorder with radiculopathy,  high cervical region: Secondary | ICD-10-CM | POA: Diagnosis not present

## 2018-03-23 DIAGNOSIS — M5001 Cervical disc disorder with myelopathy,  high cervical region: Secondary | ICD-10-CM | POA: Diagnosis not present

## 2018-03-23 DIAGNOSIS — G894 Chronic pain syndrome: Secondary | ICD-10-CM | POA: Diagnosis not present

## 2018-03-23 DIAGNOSIS — M5440 Lumbago with sciatica, unspecified side: Secondary | ICD-10-CM | POA: Diagnosis not present

## 2018-03-23 DIAGNOSIS — Z79899 Other long term (current) drug therapy: Secondary | ICD-10-CM | POA: Diagnosis not present

## 2018-04-14 ENCOUNTER — Telehealth: Payer: Self-pay

## 2018-04-14 NOTE — Telephone Encounter (Signed)
Pt phone not working called sisters phone left a message for her to have Larry Lin give [place] a call, when he calls back we can ask him if he would like to change his physical appointment .

## 2018-05-18 ENCOUNTER — Encounter: Payer: Medicare HMO | Admitting: Family Medicine

## 2018-05-18 DIAGNOSIS — G894 Chronic pain syndrome: Secondary | ICD-10-CM | POA: Diagnosis not present

## 2018-05-18 DIAGNOSIS — M5001 Cervical disc disorder with myelopathy,  high cervical region: Secondary | ICD-10-CM | POA: Diagnosis not present

## 2018-05-18 DIAGNOSIS — F1120 Opioid dependence, uncomplicated: Secondary | ICD-10-CM | POA: Diagnosis not present

## 2018-05-18 DIAGNOSIS — Z79899 Other long term (current) drug therapy: Secondary | ICD-10-CM | POA: Diagnosis not present

## 2018-05-18 DIAGNOSIS — M5011 Cervical disc disorder with radiculopathy,  high cervical region: Secondary | ICD-10-CM | POA: Diagnosis not present

## 2018-05-18 DIAGNOSIS — M542 Cervicalgia: Secondary | ICD-10-CM | POA: Diagnosis not present

## 2018-05-18 DIAGNOSIS — M961 Postlaminectomy syndrome, not elsewhere classified: Secondary | ICD-10-CM | POA: Diagnosis not present

## 2018-05-18 DIAGNOSIS — M5440 Lumbago with sciatica, unspecified side: Secondary | ICD-10-CM | POA: Diagnosis not present

## 2018-05-25 ENCOUNTER — Encounter: Payer: Medicare HMO | Admitting: Family Medicine

## 2018-06-29 ENCOUNTER — Ambulatory Visit: Payer: Medicare HMO

## 2018-06-29 ENCOUNTER — Encounter: Payer: Medicare HMO | Admitting: Family Medicine

## 2018-06-29 DIAGNOSIS — F1120 Opioid dependence, uncomplicated: Secondary | ICD-10-CM | POA: Diagnosis not present

## 2018-06-29 DIAGNOSIS — G894 Chronic pain syndrome: Secondary | ICD-10-CM | POA: Diagnosis not present

## 2018-06-29 DIAGNOSIS — Z79899 Other long term (current) drug therapy: Secondary | ICD-10-CM | POA: Diagnosis not present

## 2018-06-29 DIAGNOSIS — M5440 Lumbago with sciatica, unspecified side: Secondary | ICD-10-CM | POA: Diagnosis not present

## 2018-06-29 DIAGNOSIS — M5001 Cervical disc disorder with myelopathy,  high cervical region: Secondary | ICD-10-CM | POA: Diagnosis not present

## 2018-06-29 DIAGNOSIS — M542 Cervicalgia: Secondary | ICD-10-CM | POA: Diagnosis not present

## 2018-06-29 DIAGNOSIS — M961 Postlaminectomy syndrome, not elsewhere classified: Secondary | ICD-10-CM | POA: Diagnosis not present

## 2018-06-29 DIAGNOSIS — M5011 Cervical disc disorder with radiculopathy,  high cervical region: Secondary | ICD-10-CM | POA: Diagnosis not present

## 2018-07-02 ENCOUNTER — Telehealth: Payer: Self-pay

## 2018-07-02 NOTE — Telephone Encounter (Signed)
Copied from [place] [id number]. Topic: Quick Communication - Appointment Cancellation >> Jul 01, 2018  4:30 PM Rainey Pines A wrote: Patient called to cancel appointment scheduled for 08/24/2018. Patient would like to reschedule.

## 2018-07-02 NOTE — Telephone Encounter (Signed)
I left msg for pt to call back for r/s

## 2018-07-03 ENCOUNTER — Ambulatory Visit (INDEPENDENT_AMBULATORY_CARE_PROVIDER_SITE_OTHER): Payer: Medicare HMO

## 2018-07-03 DIAGNOSIS — Z0000 Encounter for general adult medical examination without abnormal findings: Secondary | ICD-10-CM

## 2018-07-03 NOTE — Patient Instructions (Signed)
Larry Lin , Thank you for taking time to come for your Medicare Wellness Visit. I appreciate your ongoing commitment to your health goals. Please review the following plan we discussed and let me know if I can assist you in the future.   These are the goals we discussed: Goals    . Patient Stated     Starting 07/03/18, I will continue to take medications as prescribed.        This is a list of the screening recommended for you and due dates:  Health Maintenance  Topic Date Due  . Colon Cancer Screening  01/21/2020*  . Tetanus Vaccine  01/21/2020*  . Flu Shot  08/22/2018  .  Hepatitis C: One time screening is recommended by Center for Disease Control  (CDC) for  adults born from [birth year] through 1964.   Completed  . HIV Screening  Completed  *Topic was postponed. The date shown is not the original due date.   Preventive Care for Adults  A healthy lifestyle and preventive care can promote health and wellness. Preventive health guidelines for adults include the following key practices.  . A routine yearly physical is a good way to check with your health care provider about your health and preventive screening. It is a chance to share any concerns and updates on your health and to receive a thorough exam.  . Visit your dentist for a routine exam and preventive care every 6 months. Brush your teeth twice a day and floss once a day. Good oral hygiene prevents tooth decay and gum disease.  . The frequency of eye exams is based on your age, health, family medical history, use  of contact lenses, and other factors. Follow your health care provider's recommendations for frequency of eye exams.  . Eat a healthy diet. Foods like vegetables, fruits, whole grains, low-fat dairy products, and lean protein foods contain the nutrients you need without too many calories. Decrease your intake of foods high in solid fats, added sugars, and salt. Eat the right amount of calories for you. Get information  about a proper diet from your health care provider, if necessary.  . Regular physical exercise is one of the most important things you can do for your health. Most adults should get at least 150 minutes of moderate-intensity exercise (any activity that increases your heart rate and causes you to sweat) each week. In addition, most adults need muscle-strengthening exercises on 2 or more days a week.  Silver Sneakers may be a benefit available to you. To determine eligibility, you may visit the website: www.silversneakers.com or contact program at 416-335-7791 Mon-Fri between 8AM-8PM.   . Maintain a healthy weight. The body mass index (BMI) is a screening tool to identify possible weight problems. It provides an estimate of body fat based on height and weight. Your health care provider can find your BMI and can help you achieve or maintain a healthy weight.   For adults 20 years and older: ? A BMI below 18.5 is considered underweight. ? A BMI of 18.5 to 24.9 is normal. ? A BMI of 25 to 29.9 is considered overweight. ? A BMI of 30 and above is considered obese.   . Maintain normal blood lipids and cholesterol levels by exercising and minimizing your intake of saturated fat. Eat a balanced diet with plenty of fruit and vegetables. Blood tests for lipids and cholesterol should begin at age 10 and be repeated every 5 years. If your lipid or cholesterol levels  are high, you are over 50, or you are at high risk for heart disease, you may need your cholesterol levels checked more frequently. Ongoing high lipid and cholesterol levels should be treated with medicines if diet and exercise are not working.  . If you smoke, find out from your health care provider how to quit. If you do not use tobacco, please do not start.  . If you choose to drink alcohol, please do not consume more than 2 drinks per day. One drink is considered to be 12 ounces (355 mL) of beer, 5 ounces (148 mL) of wine, or 1.5 ounces (44  mL) of liquor.  . If you are 81-68 years old, ask your health care provider if you should take aspirin to prevent strokes.  . Use sunscreen. Apply sunscreen liberally and repeatedly throughout the day. You should seek shade when your shadow is shorter than you. Protect yourself by wearing long sleeves, pants, a wide-brimmed hat, and sunglasses year round, whenever you are outdoors.  . Once a month, do a whole body skin exam, using a mirror to look at the skin on your back. Tell your health care provider of new moles, moles that have irregular borders, moles that are larger than a pencil eraser, or moles that have changed in shape or color.

## 2018-07-03 NOTE — Progress Notes (Signed)
PCP notes:   Health maintenance:  Colon cancer screening - pt would like to do Cologuard Tetanus vaccine - postponed/insurance  Abnormal screenings:   None  Patient concerns:   Patient will complete labs on 08/31/18 when he returns from beach.  Nurse concerns:  None  Next PCP appt:   08/05/18 @ 1030

## 2018-07-03 NOTE — Progress Notes (Signed)
Subjective:   Larry Lin is a 63 y.o. male who presents for an Initial Medicare Annual Wellness Visit.  Review of Systems  N/A Cardiac Risk Factors include: advanced age (>[id number], 69>65 women);male gender    Objective:    Today's Vitals   07/03/18 1311 07/03/18 1317  PainSc: 6  6   PainLoc: Back    There is no height or weight on file to calculate BMI.  Advanced Directives 07/03/2018 05/06/2017 07/25/2016 04/30/2016 02/13/2016 12/21/2015 11/30/2015  Does Patient Have a Medical Advance Directive? No Yes No No No No No  Would patient like information on creating a medical advance directive? No - Patient declined - - - - - No - patient declined information    Current Medications (verified) Outpatient Encounter Medications as of 07/03/2018  Medication Sig  . ascorbic acid (VITAMIN C) 100 MG tablet Take 1 tablet by mouth daily.  . MORPHABOND ER 30 MG T12A   . Multiple Vitamins-Minerals (MENS 50+ MULTI VITAMIN/MIN PO) Take 1 tablet by mouth daily.  . pantoprazole (PROTONIX) 20 MG tablet Take 1 tablet (20 mg total) by mouth daily.  . tadalafil (CIALIS) 5 MG tablet Take 1 tablet (5 mg total) by mouth daily as needed for erectile dysfunction.  . [DISCONTINUED] metroNIDAZOLE (METROGEL) 1 % gel Apply topically daily. (Patient not taking: Reported on 12/01/2017)  . [DISCONTINUED] mirabegron ER (MYRBETRIQ) 25 MG TB24 tablet Take 1 tablet (25 mg total) by mouth daily.  . [DISCONTINUED] Testosterone 12.5 MG/ACT (1%) GEL Place 12.5 mg onto the skin daily. (Patient not taking: Reported on 12/01/2017)   No facility-administered encounter medications on file as of 07/03/2018.     Allergies (verified) Patient has no known allergies.   History: Past Medical History:  Diagnosis Date  . Arthritis   . GERD (gastroesophageal reflux disease)   . Low testosterone    Past Surgical History:  Procedure Laterality Date  . BACK SURGERY  2006  . diverticulitis surgery     removed some of lower  intestine  . NECK SURGERY  2015  . VASECTOMY     only right side done for nerve pain   Family History  Problem Relation Age of Onset  . Leukemia Mother   . Healthy Father   . Kidney disease Neg Hx   . Prostate cancer Neg Hx    Social History   Socioeconomic History  . Marital status: Divorced    Spouse name: Not on file  . Number of children: Not on file  . Years of education: Not on file  . Highest education level: Not on file  Occupational History  . Not on file  Social Needs  . Financial resource strain: Not on file  . Food insecurity    Worry: Not on file    Inability: Not on file  . Transportation needs    Medical: Not on file    Non-medical: Not on file  Tobacco Use  . Smoking status: Former Smoker    Quit date: 01/22/1987    Years since quitting: 31.4  . Smokeless tobacco: Never Used  Substance and Sexual Activity  . Alcohol use: Yes    Alcohol/week: 1.0 standard drinks    Types: 1 Cans of beer per week    Comment: rare  . Drug use: No  . Sexual activity: Yes  Lifestyle  . Physical activity    Days per week: Not on file    Minutes per session: Not on file  . Stress: Not  on file  Relationships  . Social Herbalist on phone: Not on file    Gets together: Not on file    Attends religious service: Not on file    Active member of club or organization: Not on file    Attends meetings of clubs or organizations: Not on file    Relationship status: Not on file  Other Topics Concern  . Not on file  Social History Narrative  . Not on file   Tobacco Counseling Counseling given: No   Clinical Intake:  Pre-visit preparation completed: Yes  Pain : 0-10 Pain Score: 6  Pain Type: Chronic pain Pain Location: Back     Nutritional Status: BMI 25 -29 Overweight Nutritional Risks: None Diabetes: No  How often do you need to have someone help you when you read instructions, pamphlets, or other written materials from your doctor or pharmacy?: 1 -  Never What is the last grade level you completed in school?: Bachelor degree  Interpreter Needed?: No  Comments: pt is divorced and lives independently Information entered by :: LPinson, RN  Activities of Daily Living In your present state of health, do you have any difficulty performing the following activities: 07/03/2018  Hearing? N  Vision? N  Difficulty concentrating or making decisions? N  Walking or climbing stairs? N  Dressing or bathing? N  Doing errands, shopping? N  Preparing Food and eating ? N  Using the Toilet? N  In the past six months, have you accidently leaked urine? N  Do you have problems with loss of bowel control? N  Managing your Medications? N  Managing your Finances? N  Housekeeping or managing your Housekeeping? N  Some recent data might be hidden     Immunizations and Health Maintenance  There is no immunization history on file for this patient. There are no preventive care reminders to display for this patient.  Patient Care Team: [name], FNP as PCP - General (Nurse Practitioner)  Indicate any recent Medical Services you may have received from other than [facility] providers in the past year (date may be approximate).    Assessment:   This is a routine wellness examination for Larry Lin.  Hearing/Vision screen No exam data present  Dietary issues and exercise activities discussed: Current Exercise Habits: Home exercise routine, Type of exercise: Other - see comments;stretching(PT exercises, core, chest, legs), Time (Minutes): 40, Frequency (Times/Week): 4, Weekly Exercise (Minutes/Week): 160, Intensity: Mild, Exercise limited by: None identified  Goals    . Patient Stated     Starting 07/03/18, I will continue to take medications as prescribed.       Depression Screen PHQ 2/9 Scores 07/03/2018 12/01/2017 02/04/2017 07/25/2016  PHQ - 2 Score 0 0 0 0  PHQ- 9 Score 0 - - -    Fall Risk Fall Risk  07/03/2018 05/20/2017 02/04/2017 07/25/2016  04/30/2016  Falls in the past year? 1 No No No No  Number falls in past yr: 0 - - - -  Injury with Fall? 0 - - - -  Risk for fall due to : Impaired balance/gait - - - -   Cognitive Function: MMSE - Mini Mental State Exam 07/03/2018  Orientation to time 5  Orientation to Place 5  Registration 3  Attention/ Calculation 0  Recall 3  Language- name 2 objects 0  Language- repeat 1  Language- follow 3 step command 0  Language- read & follow direction 0  Write a sentence 0  Copy design 0  Total score 17     PLEASE NOTE: A Mini-Cog screen was completed. Maximum score is 17. A value of 0 denotes this part of Folstein MMSE was not completed or the patient failed this part of the Mini-Cog screening.   Mini-Cog Screening Orientation to Time - Max 5 pts Orientation to Place - Max 5 pts Registration - Max 3 pts Recall - Max 3 pts Language Repeat - Max 1 pts      Screening Tests Health Maintenance  Topic Date Due  . COLONOSCOPY  01/21/2020 (Originally 10/21/2016)  . TETANUS/TDAP  01/21/2020 (Originally 10/20/1975)  . INFLUENZA VACCINE  08/22/2018  . Hepatitis C Screening  Completed  . HIV Screening  Completed     Plan:     I have personally reviewed, addressed, and noted the following in the patient's chart:  A. Medical and social history B. Use of alcohol, tobacco or illicit drugs  C. Current medications and supplements D. Functional ability and status E.  Nutritional status F.  Physical activity G. Advance directives H. List of other physicians I.  Hospitalizations, surgeries, and ER visits in previous 12 months J.  Vitals (unless it is a telemedicine encounter) K. Screenings to include cognitive, depression, hearing, vision (NOTE: hearing and vision screenings not completed in telemedicine encounter) L. Referrals and appointments   In addition, I have reviewed and discussed with patient certain preventive protocols, quality metrics, and best practice recommendations. A  written personalized care plan for preventive services and recommendations were provided to patient.  With patient's permission, we connected on 07/03/18 at  1:00 PM EDT. Interactive audio and video telecommunications were attempted with patient. This attempt was unsuccessful due to patient having technical difficulties OR patient did not have access to video capability.  Encounter was completed with audio only.  Two patient identifiers were used to ensure the encounter occurred with the correct person. Patient was in home and writer was in office.   Signed,   Randa EvensLesia Fredderick Swanger, MHA, BS, RN Health Coach

## 2018-07-06 NOTE — Progress Notes (Signed)
I reviewed health advisor's note, was available for consultation, and agree with documentation and plan.  

## 2018-07-13 ENCOUNTER — Encounter: Payer: Medicare HMO | Admitting: Family Medicine

## 2018-08-03 DIAGNOSIS — M5001 Cervical disc disorder with myelopathy,  high cervical region: Secondary | ICD-10-CM | POA: Diagnosis not present

## 2018-08-03 DIAGNOSIS — F1120 Opioid dependence, uncomplicated: Secondary | ICD-10-CM | POA: Diagnosis not present

## 2018-08-03 DIAGNOSIS — M961 Postlaminectomy syndrome, not elsewhere classified: Secondary | ICD-10-CM | POA: Diagnosis not present

## 2018-08-03 DIAGNOSIS — G894 Chronic pain syndrome: Secondary | ICD-10-CM | POA: Diagnosis not present

## 2018-08-03 DIAGNOSIS — G8929 Other chronic pain: Secondary | ICD-10-CM | POA: Diagnosis not present

## 2018-08-03 DIAGNOSIS — M5011 Cervical disc disorder with radiculopathy,  high cervical region: Secondary | ICD-10-CM | POA: Diagnosis not present

## 2018-08-03 DIAGNOSIS — M5440 Lumbago with sciatica, unspecified side: Secondary | ICD-10-CM | POA: Diagnosis not present

## 2018-08-03 DIAGNOSIS — M542 Cervicalgia: Secondary | ICD-10-CM | POA: Diagnosis not present

## 2018-08-05 ENCOUNTER — Encounter: Payer: Self-pay | Admitting: Family Medicine

## 2018-08-05 ENCOUNTER — Ambulatory Visit (INDEPENDENT_AMBULATORY_CARE_PROVIDER_SITE_OTHER): Payer: Medicare HMO | Admitting: Family Medicine

## 2018-08-05 VITALS — BP 95/67 | Wt 149.0 lb

## 2018-08-05 DIAGNOSIS — Z1211 Encounter for screening for malignant neoplasm of colon: Secondary | ICD-10-CM | POA: Diagnosis not present

## 2018-08-05 DIAGNOSIS — R41840 Attention and concentration deficit: Secondary | ICD-10-CM | POA: Diagnosis not present

## 2018-08-05 DIAGNOSIS — R634 Abnormal weight loss: Secondary | ICD-10-CM | POA: Diagnosis not present

## 2018-08-05 DIAGNOSIS — K219 Gastro-esophageal reflux disease without esophagitis: Secondary | ICD-10-CM | POA: Diagnosis not present

## 2018-08-05 DIAGNOSIS — E7800 Pure hypercholesterolemia, unspecified: Secondary | ICD-10-CM | POA: Insufficient documentation

## 2018-08-05 NOTE — Progress Notes (Signed)
Virtual Visit via Video Note  I connected with Larry Lin on 08/05/18 at 10:30 AM EDT by a video enabled telemedicine application and verified that I am speaking with the correct person using two identifiers.  Location: Patient: at his home Provider: [place]   I discussed the limitations of evaluation and management by telemedicine and the availability of in person appointments. The patient expressed understanding and agreed to proceed.  History of Present Illness: This is a 63 yo male who requests video visit for CPE. Unfortunately, he was unable to enable his video on his phone so we did a follow up visit with audio only and discussed his current concerns. [place] is [place] living [place]. He is planning to come back to [place] next month. He has labs scheduled.   Colon cancer screening- he had a colonoscopy in 2008 with 10 year recall, he wishes to do cologuard.   GERD- has weaned pantoprazole to once every 3 days. Has not noticed significant increase in symptoms, feels that they are pretty well controlled, is interested in further wean of medication.   Chronic neck and back pain- has had some improvement in fatigue with change of medication to MS contin. Neck pain continues to be more problematic than back pain.   Difficulty completing tasks- both of his sons have ADHD and he wonders if he has as well. Has noticed more difficulty in finishing projects. 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 His pain and fatigue also contribute to difficulty completing tasks.   Decreased BP- has been using automatic cuff and BP has been running low. Today 95/67, had readings from 37-858 systolic/ 85-02 diastolic. Denies dizziness, lightheadedness, chest pain. Feels fatigued sometimes. Some SOB with exercise which he thinks is related to gym being closed for several months and he has just started back.  Weight loss-  continues to have poor appetite, weight down. He cooks for himself, having a protein shake daily.   Past Medical History:  Diagnosis Date  . Arthritis   . GERD (gastroesophageal reflux disease)   . Low testosterone    Past Surgical History:  Procedure Laterality Date  . BACK SURGERY  2006  . diverticulitis surgery     removed some of lower intestine  . NECK SURGERY  2015  . VASECTOMY     only right side done for nerve pain   Family History  Problem Relation Age of Onset  . Leukemia Mother   . Healthy Father   . Kidney disease Neg Hx   . Prostate cancer Neg Hx    Social History   Tobacco Use  . Smoking status: Former Smoker    Quit date: 01/22/1987    Years since quitting: 31.5  . Smokeless tobacco: Never Used  Substance Use Topics  . Alcohol use: Not Currently    Alcohol/week: 1.0 standard drinks    Types: 1 Cans of beer per week  . Drug use: No      Observations/Objective: The patient is alert and answers questions appropriately. He is able to talk in complete sentences with out shortness of breath, audible wheeze or cough. Mood appropriate.   BP 95/67 Comment: patient reported  Wt 149 lb (67.6 kg)   BMI 19.52 kg/m   Assessment and Plan: 1. Screening for colon cancer - Cologuard  2. Weight loss, unintentional - continues to find food not very palatable and has little  appetite, will check labs, encouraged him to eat adequate protein, calorie dense foods - CBC with Differential/Platelet; Future - Comprehensive metabolic panel; Future - Lipid panel; Future - TSH; Future  3. Elevated LDL cholesterol level - Lipid panel; Future - TSH; Future  4. Gastroesophageal reflux disease, esophagitis presence not specified - will have him stop pantoprazole since he is taking every 3rd day and have him start famotidine prn.   5. Difficulty concentrating - likely multifactorial, discussed that stimulant medication not advised due to potential adverse effects -  discussed strategies and he reports that he has information of this type at home and will review. Issue not currently interfering with ADLs or relationships.   - will follow up when labs done next month  Olean Reeeborah Synethia Endicott, FNP-BC  [facility], [facility]  08/05/2018 11:27 AM   Follow Up Instructions: Visit recap sent to patient via mychart   I discussed the assessment and treatment plan with the patient. The patient was provided an opportunity to ask questions and all were answered. The patient agreed with the plan and demonstrated an understanding of the instructions.   The patient was advised to call back or seek an in-person evaluation if the symptoms worsen or if the condition fails to improve as anticipated.  I provided 25 minutes of non-face-to-face time during this encounter.   Emi Belfasteborah B Zayvion Stailey, FNP

## 2018-08-24 ENCOUNTER — Other Ambulatory Visit: Payer: Self-pay | Admitting: Family Medicine

## 2018-08-24 ENCOUNTER — Encounter: Payer: Medicare HMO | Admitting: Family Medicine

## 2018-08-24 ENCOUNTER — Telehealth: Payer: Self-pay | Admitting: Family Medicine

## 2018-08-24 DIAGNOSIS — Z125 Encounter for screening for malignant neoplasm of prostate: Secondary | ICD-10-CM

## 2018-08-24 NOTE — Telephone Encounter (Signed)
Patient called today about his lab appt next Tuesday. He wanted to also get a PSA done. His Urologist requested this test, advised patient we normally only draw labs for other [place] offices. Patient wanted a message sent back to see if you could order this for him to have done at appt next week    C/B # [phone]

## 2018-08-24 NOTE — Telephone Encounter (Signed)
Please call patient and let him know that I have ordered the PSA.

## 2018-08-24 NOTE — Telephone Encounter (Signed)
Left detailed message making aware that PSA added. Nothing further needed.

## 2018-08-26 ENCOUNTER — Other Ambulatory Visit: Payer: Self-pay | Admitting: Family Medicine

## 2018-08-26 DIAGNOSIS — E349 Endocrine disorder, unspecified: Secondary | ICD-10-CM

## 2018-08-26 DIAGNOSIS — N529 Male erectile dysfunction, unspecified: Secondary | ICD-10-CM

## 2018-08-26 NOTE — Telephone Encounter (Signed)
Patient called back, advised him that PSA was ordered.   He would like to make sure that the testosterone has also been ordered

## 2018-08-26 NOTE — Telephone Encounter (Signed)
Please let him know that testosterone was ordered as well. I see he has urology appointment on the same day as blood draw, labs will take a day or two to come back.

## 2018-08-28 NOTE — Telephone Encounter (Signed)
Pt aware. Nothing further needed 

## 2018-08-28 NOTE — Telephone Encounter (Signed)
LMTCB

## 2018-08-31 ENCOUNTER — Other Ambulatory Visit: Payer: Medicare HMO

## 2018-08-31 DIAGNOSIS — M961 Postlaminectomy syndrome, not elsewhere classified: Secondary | ICD-10-CM | POA: Diagnosis not present

## 2018-08-31 DIAGNOSIS — M5011 Cervical disc disorder with radiculopathy,  high cervical region: Secondary | ICD-10-CM | POA: Diagnosis not present

## 2018-08-31 DIAGNOSIS — M5440 Lumbago with sciatica, unspecified side: Secondary | ICD-10-CM | POA: Diagnosis not present

## 2018-08-31 DIAGNOSIS — M5001 Cervical disc disorder with myelopathy,  high cervical region: Secondary | ICD-10-CM | POA: Diagnosis not present

## 2018-08-31 DIAGNOSIS — F1120 Opioid dependence, uncomplicated: Secondary | ICD-10-CM | POA: Diagnosis not present

## 2018-08-31 DIAGNOSIS — G894 Chronic pain syndrome: Secondary | ICD-10-CM | POA: Diagnosis not present

## 2018-08-31 DIAGNOSIS — M542 Cervicalgia: Secondary | ICD-10-CM | POA: Diagnosis not present

## 2018-08-31 NOTE — Progress Notes (Signed)
09/01/2018 11:10 AM   Bess KindsJeffrey L Travelstead Jul 07, 1955 [id number]  Referring provider: Emi BelfastGessner, Deborah B, FNP [address] [place],  [place] [id number]  Chief Complaint  Patient presents with  . Follow-up    HPI: Patient is a 63 year old male with testosterone deficiency, ED, BPH with LU TS and nocturia who presents today for a follow up.    Testosterone deficiency He is having spontaneous erections at night when he takes the Cialis.  He does not have sleep apnea.   He found the AndroGel cost prohibitive, so he has not been on the gel for several months.   His most recent testosterone was 437 in 08/2017.    Erectile dysfunction SHIM score is 12, which is mild to moderate ED.  His previous SHIM score was 21.  He has been having difficulty with erections for the last few years.  His risk factors for ED are age, testosterone deficiency and anxiety.  He denies any painful erections or curvatures with his erections.   He has tried Cialis in the past with good results.    SHIM    Row Name 09/01/18 1028         SHIM: Over the last 6 months:   How do you rate your confidence that you could get and keep an erection?  High     When you had erections with sexual stimulation, how often were your erections hard enough for penetration (entering your partner)?  Sometimes (about half the time)     During sexual intercourse, how often were you able to maintain your erection after you had penetrated (entered) your partner?  A Few Times (much less than half the time)     During sexual intercourse, how difficult was it to maintain your erection to completion of intercourse?  Extremely Difficult     When you attempted sexual intercourse, how often was it satisfactory for you?  A Few Times (much less than half the time)       SHIM Total Score   SHIM  12         BPH WITH LUTS  (prostate and/or bladder) IPSS score: 9/3   Previous score: 9/3   Previous PVR: 0 mL  Major complaint(s):  Nocturia,  urgency and a weak stream  x few years. Denies any dysuria or suprapubic pain.  He had one episode of blood, but he is not certain if it was in the semen or the urine.    Currently taking: Myrbetriq prn and Cialis 5 mg daily and finds that helpful  Denies any recent fevers, chills, nausea or vomiting.  He does not have a family history of PCa.  IPSS    Row Name 09/01/18 1000         International Prostate Symptom Score   How often have you had the sensation of not emptying your bladder?  Not at All     How often have you had to urinate less than every two hours?  About half the time     How often have you found you stopped and started again several times when you urinated?  Not at All     How often have you found it difficult to postpone urination?  More than half the time     How often have you had a weak urinary stream?  Not at All     How often have you had to strain to start urination?  Not at All  How many times did you typically get up at night to urinate?  2 Times     Total IPSS Score  9       Quality of Life due to urinary symptoms   If you were to spend the rest of your life with your urinary condition just the way it is now how would you feel about that?  Mixed        Score:  1-7 Mild 8-19 Moderate 20-35 Severe  PMH: Past Medical History:  Diagnosis Date  . Arthritis   . GERD (gastroesophageal reflux disease)   . Low testosterone     Surgical History: Past Surgical History:  Procedure Laterality Date  . BACK SURGERY  2006  . diverticulitis surgery     removed some of lower intestine  . NECK SURGERY  2015  . VASECTOMY     only right side done for nerve pain    Home Medications:  Allergies as of 09/01/2018   No Known Allergies     Medication List       Accurate as of September 01, 2018 11:10 AM. If you have any questions, ask your nurse or doctor.        ALPRAZolam 0.25 MG tablet Commonly known as: XANAX Take 0.25 mg by mouth at bedtime as  needed for anxiety.   ascorbic acid 100 MG tablet Commonly known as: VITAMIN C Take 1 tablet by mouth daily.   MENS 50+ MULTI VITAMIN/MIN PO Take 1 tablet by mouth daily.   morphine 30 MG 12 hr tablet Commonly known as: MS CONTIN Take 1 tablet by mouth 2 (two) times a day.   pantoprazole 20 MG tablet Commonly known as: PROTONIX Take 1 tablet (20 mg total) by mouth daily.   tadalafil 5 MG tablet Commonly known as: CIALIS Take 1 tablet (5 mg total) by mouth daily as needed for erectile dysfunction.   Testosterone 20.25 MG/1.25GM (1.62%) Gel Apply two pumps (40.5 mg) to intact skin daily Started by: Michiel CowboySHANNON Woods Gangemi, PA-C       Allergies: No Known Allergies  Family History: Family History  Problem Relation Age of Onset  . Leukemia Mother   . Healthy Father   . Kidney disease Neg Hx   . Prostate cancer Neg Hx     Social History:  reports that he quit smoking about 31 years ago. He has never used smokeless tobacco. He reports previous alcohol use of about 1.0 standard drinks of alcohol per week. He reports that he does not use drugs.  ROS: UROLOGY Frequent Urination?: No Hard to postpone urination?: No Burning/pain with urination?: No Get up at night to urinate?: Yes Leakage of urine?: No Urine stream starts and stops?: No Trouble starting stream?: No Do you have to strain to urinate?: No Blood in urine?: No Urinary tract infection?: No Sexually transmitted disease?: No Injury to kidneys or bladder?: No Painful intercourse?: No Weak stream?: Yes Erection problems?: No Penile pain?: Yes  Gastrointestinal Nausea?: No Vomiting?: No Indigestion/heartburn?: No Diarrhea?: No Constipation?: Yes  Constitutional Fever: No Night sweats?: No Weight loss?: Yes Fatigue?: Yes  Skin Skin rash/lesions?: No Itching?: No  Eyes Blurred vision?: No Double vision?: No  Ears/Nose/Throat Sore throat?: No Sinus problems?: No  Hematologic/Lymphatic Swollen  glands?: No Easy bruising?: No  Cardiovascular Leg swelling?: No Chest pain?: No  Respiratory Cough?: No Shortness of breath?: No  Endocrine Excessive thirst?: No  Musculoskeletal Back pain?: Yes Joint pain?: Yes  Neurological Headaches?: No Dizziness?: Yes  Psychologic Depression?: No Anxiety?: No  Physical Exam: BP 124/77   Pulse 72   Ht 6\' 2"  (1.88 m)   Wt 151 lb (68.5 kg)   BMI 19.39 kg/m   Constitutional:  Well nourished. Alert and oriented, No acute distress. HEENT:  AT, moist mucus membranes.  Trachea midline, no masses. Cardiovascular: No clubbing, cyanosis, or edema. Respiratory: Normal respiratory effort, no increased work of breathing. GI: Abdomen is soft, non tender, non distended, no abdominal masses. Liver and spleen not palpable.  No hernias appreciated.  Stool sample for occult testing is not indicated.   GU: No CVA tenderness.  No bladder fullness or masses.  Patient with circumcised phallus.   Urethral meatus is patent.  No penile discharge. No penile lesions or rashes. Scrotum without lesions, cysts, rashes and/or edema.  Testicles are located scrotally bilaterally. No masses are appreciated in the testicles. Left and right epididymis are normal. Rectal: Patient with  normal sphincter tone. Anus and perineum without scarring or rashes. No rectal masses are appreciated. Prostate is approximately 45 grams, no nodules are appreciated. Seminal vesicles could not be palpated Skin: No rashes, bruises or suspicious lesions. Lymph: No  inguinal adenopathy. Neurologic: Grossly intact, no focal deficits, moving all 4 extremities. Psychiatric: Normal mood and affect.  Laboratory Data: Lab Results  Component Value Date   WBC 6.1 12/01/2017   HGB 14.4 12/01/2017   HCT 41.9 12/01/2017   MCV 88.1 12/01/2017   PLT 228.0 12/01/2017    Lab Results  Component Value Date   CREATININE 0.95 12/01/2017    Lab Results  Component Value Date   TESTOSTERONE 437  09/04/2017    Lab Results  Component Value Date   HGBA1C 5.5 11/25/2014    Lab Results  Component Value Date   AST 14 12/01/2017   Lab Results  Component Value Date   ALT 11 12/01/2017   PSA     0.2 ng/mL on 04/25/2015  0.2 ng/mL on 02/13/2016  0.2 ng/mL on 03/26/2017  0.1 ng/mL on 09/05/2018    Urinalysis Component     Latest Ref Rng & Units 09/01/2018  Specific Gravity, UA     1.005 - 1.030 1.010  pH, UA     5.0 - 7.5 6.0  Color, UA     Yellow Yellow  Appearance Ur     Clear Clear  Leukocytes,UA     Negative Negative  Protein,UA     Negative/Trace Negative  Glucose, UA     Negative Negative  Ketones, UA     Negative Negative  RBC, UA     Negative Negative  Bilirubin, UA     Negative Negative  Urobilinogen, Ur     0.2 - 1.0 mg/dL 0.2  Nitrite, UA     Negative Negative  Microscopic Examination      See below:   Component     Latest Ref Rng & Units 09/01/2018        11:00 AM  WBC, UA     0 - 5 /hpf None seen  RBC     0 - 2 /hpf None seen  Epithelial Cells (non renal)     0 - 10 /hpf 0-10  Bacteria, UA     None seen/Few None seen   I have reviewed the labs.     Assessment & Plan:    1. Hematospermia/Gross hematuria I explained to Mr. Polak that if he had an episode of gross hematuria vs hematospermia it is import that  he undergo a hematuria work up to evaluate for urological cancers - after I explained what the hematuria work up consisted of (CTU and cystoscopy) he stated it was in his semen  His UA today negative for [facility] RTC in 4 weeks for recheck   2. Testosterone deficiency Prescription given for AndroGel 1.62%, 2 pumps daily  Testosterone level is pending from PCP's office - will likely be subtherapeutic Will have a repeated testosterone level in four weeks to check therapeutic effect of the AndroGel  3. Erectile dysfunction SHIM score is 12, it has worsened  Prescription given for Cialis   3. BPH with LUTS/Prostatitis Continue  conservative management, avoiding bladder irritants and timed voiding's Most bothersome symptom is frequency Completed PT Continue Cialis 5 mg daily to pharmacy RTC in 6 months for I PSS, PSA, PVR and exam  4. Frequency Patient takes his Myrbetriq on a prn basis    Return in about 4 weeks (around 09/29/2018) for testosterone level and UA .  These notes generated with voice recognition software. I apologize for typographical errors.  Michiel CowboySHANNON Terrence Pizana, PA-C  [facility] [address] [place], [place] [id number] [phone]

## 2018-09-01 ENCOUNTER — Other Ambulatory Visit: Payer: Medicare HMO

## 2018-09-01 ENCOUNTER — Encounter: Payer: Self-pay | Admitting: Urology

## 2018-09-01 ENCOUNTER — Other Ambulatory Visit: Payer: Self-pay

## 2018-09-01 ENCOUNTER — Other Ambulatory Visit (INDEPENDENT_AMBULATORY_CARE_PROVIDER_SITE_OTHER): Payer: Medicare HMO

## 2018-09-01 ENCOUNTER — Ambulatory Visit: Payer: Medicare HMO | Admitting: Urology

## 2018-09-01 VITALS — BP 124/77 | HR 72 | Ht 74.0 in | Wt 151.0 lb

## 2018-09-01 DIAGNOSIS — N529 Male erectile dysfunction, unspecified: Secondary | ICD-10-CM | POA: Diagnosis not present

## 2018-09-01 DIAGNOSIS — Z125 Encounter for screening for malignant neoplasm of prostate: Secondary | ICD-10-CM | POA: Diagnosis not present

## 2018-09-01 DIAGNOSIS — R634 Abnormal weight loss: Secondary | ICD-10-CM

## 2018-09-01 DIAGNOSIS — N401 Enlarged prostate with lower urinary tract symptoms: Secondary | ICD-10-CM | POA: Diagnosis not present

## 2018-09-01 DIAGNOSIS — N138 Other obstructive and reflux uropathy: Secondary | ICD-10-CM | POA: Diagnosis not present

## 2018-09-01 DIAGNOSIS — E349 Endocrine disorder, unspecified: Secondary | ICD-10-CM | POA: Diagnosis not present

## 2018-09-01 DIAGNOSIS — E7800 Pure hypercholesterolemia, unspecified: Secondary | ICD-10-CM

## 2018-09-01 DIAGNOSIS — R350 Frequency of micturition: Secondary | ICD-10-CM

## 2018-09-01 DIAGNOSIS — D649 Anemia, unspecified: Secondary | ICD-10-CM

## 2018-09-01 LAB — COMPREHENSIVE METABOLIC PANEL
ALT: 10 U/L (ref 0–53)
AST: 14 U/L (ref 0–37)
Albumin: 4.8 g/dL (ref 3.5–5.2)
Alkaline Phosphatase: 43 U/L (ref 39–117)
BUN: 14 mg/dL (ref 6–23)
CO2: 30 mEq/L (ref 19–32)
Calcium: 9.8 mg/dL (ref 8.4–10.5)
Chloride: 103 mEq/L (ref 96–112)
Creatinine, Ser: 0.76 mg/dL (ref 0.40–1.50)
GFR: 103.97 mL/min (ref >60.00)
Glucose, Bld: 87 mg/dL (ref 70–99)
Potassium: 4 mEq/L (ref 3.5–5.1)
Sodium: 141 mEq/L (ref 135–145)
Total Bilirubin: 0.8 mg/dL (ref 0.2–1.2)
Total Protein: 7.2 g/dL (ref 6.0–8.3)

## 2018-09-01 LAB — CBC WITH DIFFERENTIAL/PLATELET
Basophils Absolute: 0 10*3/uL (ref 0.0–0.1)
Basophils Relative: 0.6 % (ref 0.0–3.0)
Eosinophils Absolute: 0.1 10*3/uL (ref 0.0–0.7)
Eosinophils Relative: 1.2 % (ref 0.0–5.0)
HCT: 37.9 % — ABNORMAL LOW (ref 39.0–52.0)
Hemoglobin: 12.8 g/dL — ABNORMAL LOW (ref 13.0–17.0)
Lymphocytes Relative: 41.1 % (ref 12.0–46.0)
Lymphs Abs: 2.2 10*3/uL (ref 0.7–4.0)
MCHC: 33.8 g/dL (ref 30.0–36.0)
MCV: 89.3 fl (ref 78.0–100.0)
Monocytes Absolute: 0.5 10*3/uL (ref 0.1–1.0)
Monocytes Relative: 9.2 % (ref 3.0–12.0)
Neutro Abs: 2.6 10*3/uL (ref 1.4–7.7)
Neutrophils Relative %: 47.9 % (ref 43.0–77.0)
Platelets: 198 10*3/uL (ref 150.0–400.0)
RBC: 4.25 Mil/uL (ref 4.22–5.81)
RDW: 13.1 % (ref 11.5–15.5)
WBC: 5.4 10*3/uL (ref 4.0–10.5)

## 2018-09-01 LAB — MICROSCOPIC EXAMINATION
Bacteria, UA: NONE SEEN
RBC: NONE SEEN /hpf (ref 0–2)
WBC, UA: NONE SEEN /hpf (ref 0–5)

## 2018-09-01 LAB — URINALYSIS, COMPLETE
Bilirubin, UA: NEGATIVE
Glucose, UA: NEGATIVE
Ketones, UA: NEGATIVE
Leukocytes,UA: NEGATIVE
Nitrite, UA: NEGATIVE
Protein,UA: NEGATIVE
RBC, UA: NEGATIVE
Specific Gravity, UA: 1.01 (ref 1.005–1.030)
Urobilinogen, Ur: 0.2 mg/dL (ref 0.2–1.0)
pH, UA: 6 (ref 5.0–7.5)

## 2018-09-01 LAB — PSA, MEDICARE: PSA: 0.1 ng/ml (ref 0.10–4.00)

## 2018-09-01 LAB — LIPID PANEL
Cholesterol: 186 mg/dL (ref 0–200)
HDL: 50.2 mg/dL (ref >39.00)
LDL Cholesterol: 117 mg/dL — ABNORMAL HIGH (ref 0–99)
NonHDL: 135.83
Total CHOL/HDL Ratio: 4
Triglycerides: 96 mg/dL (ref 0.0–149.0)
VLDL: 19.2 mg/dL (ref 0.0–40.0)

## 2018-09-01 LAB — TESTOSTERONE: Testosterone: 277.19 ng/dL — ABNORMAL LOW (ref 300.00–890.00)

## 2018-09-01 LAB — TSH: TSH: 0.89 u[IU]/mL (ref 0.35–4.50)

## 2018-09-01 MED ORDER — TADALAFIL 5 MG PO TABS: 5.0000 mg | ORAL_TABLET | Freq: Every day | ORAL | 3 refills | Status: DC | PRN

## 2018-09-01 MED ORDER — TADALAFIL 10 MG PO TABS: 10.0000 mg | ORAL_TABLET | Freq: Every day | ORAL | 5 refills | Status: DC | PRN

## 2018-09-01 MED ORDER — TESTOSTERONE 20.25 MG/1.25GM (1.62%) TD GEL: TRANSDERMAL | 5 refills | Status: DC

## 2018-09-02 ENCOUNTER — Encounter: Payer: Medicare HMO | Admitting: Family Medicine

## 2018-09-04 ENCOUNTER — Telehealth: Payer: Self-pay | Admitting: Family Medicine

## 2018-09-04 NOTE — Telephone Encounter (Signed)
Patient is returning call from the office. ° °

## 2018-09-04 NOTE — Telephone Encounter (Signed)
Labs done 09/01/2018. Result note sent to CMA to call patient.

## 2018-09-04 NOTE — Telephone Encounter (Signed)
Patient called to get his lab results.  Please call patient. °

## 2018-09-04 NOTE — Telephone Encounter (Signed)
Please advise, labs done 08/22/2018

## 2018-09-04 NOTE — Addendum Note (Signed)
Addended by: Clarene Reamer B on: 09/04/2018 03:31 PM   Modules accepted: Orders

## 2018-09-08 NOTE — Telephone Encounter (Signed)
Lab results discussed with Larry Lin.  See result note from 09/01/2018 labs.

## 2018-09-08 NOTE — Telephone Encounter (Signed)
Patient returned Ashtyn's call about lab results.  Please call patient back at [phone].

## 2018-09-10 ENCOUNTER — Encounter: Payer: Self-pay | Admitting: Gastroenterology

## 2018-09-23 ENCOUNTER — Ambulatory Visit: Payer: Medicare HMO | Admitting: Gastroenterology

## 2018-09-24 ENCOUNTER — Ambulatory Visit: Payer: Medicare HMO | Admitting: Gastroenterology

## 2018-09-24 DIAGNOSIS — H5213 Myopia, bilateral: Secondary | ICD-10-CM | POA: Diagnosis not present

## 2018-10-05 ENCOUNTER — Other Ambulatory Visit: Payer: Medicare HMO

## 2018-10-05 DIAGNOSIS — G894 Chronic pain syndrome: Secondary | ICD-10-CM | POA: Diagnosis not present

## 2018-10-05 DIAGNOSIS — M5440 Lumbago with sciatica, unspecified side: Secondary | ICD-10-CM | POA: Diagnosis not present

## 2018-10-05 DIAGNOSIS — M5011 Cervical disc disorder with radiculopathy,  high cervical region: Secondary | ICD-10-CM | POA: Diagnosis not present

## 2018-10-05 DIAGNOSIS — M542 Cervicalgia: Secondary | ICD-10-CM | POA: Diagnosis not present

## 2018-10-05 DIAGNOSIS — M5001 Cervical disc disorder with myelopathy,  high cervical region: Secondary | ICD-10-CM | POA: Diagnosis not present

## 2018-10-05 DIAGNOSIS — M961 Postlaminectomy syndrome, not elsewhere classified: Secondary | ICD-10-CM | POA: Diagnosis not present

## 2018-10-05 DIAGNOSIS — F1120 Opioid dependence, uncomplicated: Secondary | ICD-10-CM | POA: Diagnosis not present

## 2018-11-02 ENCOUNTER — Ambulatory Visit: Payer: Medicare HMO | Admitting: Gastroenterology

## 2018-11-03 ENCOUNTER — Other Ambulatory Visit: Payer: Self-pay

## 2018-11-03 ENCOUNTER — Ambulatory Visit: Payer: Medicare HMO | Admitting: Gastroenterology

## 2018-11-03 ENCOUNTER — Encounter: Payer: Self-pay | Admitting: Gastroenterology

## 2018-11-03 ENCOUNTER — Other Ambulatory Visit: Payer: Medicare HMO

## 2018-11-03 VITALS — BP 104/66 | HR 91 | Temp 98.4°F | Resp 17 | Ht 74.0 in | Wt 149.0 lb

## 2018-11-03 DIAGNOSIS — E349 Endocrine disorder, unspecified: Secondary | ICD-10-CM

## 2018-11-03 DIAGNOSIS — D649 Anemia, unspecified: Secondary | ICD-10-CM | POA: Diagnosis not present

## 2018-11-03 DIAGNOSIS — R634 Abnormal weight loss: Secondary | ICD-10-CM

## 2018-11-03 DIAGNOSIS — R350 Frequency of micturition: Secondary | ICD-10-CM | POA: Diagnosis not present

## 2018-11-03 NOTE — Progress Notes (Signed)
Larry Darby, MD [address]  [place]  [place], [place] [address]  Main: [phone]  Fax: [phone]    Gastroenterology Consultation  Referring Provider:     [name], FNP Primary Care Physician:  [name], FNP Primary Gastroenterologist:  Dr. Cephas Lin Reason for Consultation:     Unintentional weight loss, normocytic anemia        HPI:   Larry Lin is a 63 y.o. male referred by Dr. [name], [name], FNP  for consultation & management of unintentional weight loss, normocytic anemia.  Patient reports that for the last 12 to 16 months, he has been losing weight by 1 pound a month associated with gradual loss of appetite.  He also reports upper abdominal tenderness, worse after eating.  For the last 2 months, he has been experiencing constipation.  He denies rectal bleeding, melena.  Labs by his PCP revealed mild normocytic anemia, hemoglobin 12.8.  CMP, TSH, HIV, hep C antibody unremarkable. He lives alone.  Patient was working as a Financial planner at [employer], quit job due to severe back pain and underwent back surgery.  He recently sold his house in [place] and currently staying in the boat in [place].  He continues his medical care in [place], by staying in a to be in be in town.  He is planning to visit his son in [place] today He denies history of heavy smoking, denies heavy alcohol use  NSAIDs: None  Antiplts/Anticoagulants/Anti thrombotics: None  GI Procedures: Colonoscopy about 20 years ago and he had history of sigmoid diverticulitis and underwent surgery  Past Medical History:  Diagnosis Date  . Arthritis   . GERD (gastroesophageal reflux disease)   . Low testosterone     Past Surgical History:  Procedure Laterality Date  . BACK SURGERY  2006  . diverticulitis surgery     removed some of lower intestine  . NECK SURGERY  2015  . VASECTOMY     only right side done for nerve pain    Current Outpatient Medications:   .  ALPRAZolam (XANAX) 0.25 MG tablet, Take 0.25 mg by mouth at bedtime as needed for anxiety., Disp: , Rfl:  .  ascorbic acid (VITAMIN C) 100 MG tablet, Take 1 tablet by mouth daily., Disp: , Rfl:  .  morphine (MS CONTIN) 30 MG 12 hr tablet, Take 1 tablet by mouth 2 (two) times a day., Disp: , Rfl:  .  Multiple Vitamins-Minerals (MENS 50+ MULTI VITAMIN/MIN PO), Take 1 tablet by mouth daily., Disp: , Rfl:  .  tadalafil (CIALIS) 10 MG tablet, Take 1 tablet (10 mg total) by mouth daily as needed for erectile dysfunction., Disp: 30 tablet, Rfl: 5 .  Testosterone 20.25 MG/1.25GM (1.62%) GEL, Apply two pumps (40.5 mg) to intact skin daily, Disp: 88 g, Rfl: 5 .  pantoprazole (PROTONIX) 20 MG tablet, Take 1 tablet (20 mg total) by mouth daily. (Patient not taking: Reported on 11/03/2018), Disp: 90 tablet, Rfl: 1   Family History  Problem Relation Age of Onset  . Leukemia Mother   . Healthy Father   . Kidney disease Neg Hx   . Prostate cancer Neg Hx      Social History   Tobacco Use  . Smoking status: Former Smoker    Quit date: 01/22/1987    Years since quitting: 31.8  . Smokeless tobacco: Never Used  Substance Use Topics  . Alcohol use: Not Currently    Alcohol/week: 1.0  standard drinks    Types: 1 Cans of beer per week  . Drug use: No    Allergies as of 11/03/2018  . (No Known Allergies)    Review of Systems:    All systems reviewed and negative except where noted in HPI.   Physical Exam:  BP 104/66 (BP Location: Left Arm, Patient Position: Sitting, Cuff Size: Normal)   Pulse 91   Temp 98.4 F (36.9 C)   Resp 17   Ht 6\' 2"  (1.88 m)   Wt 149 lb (67.6 kg)   BMI 19.13 kg/m  No LMP for male patient.  General:   Alert,  Well-developed, well-nourished, pleasant and cooperative in NAD Head:  Normocephalic and atraumatic. Eyes:  Sclera clear, no icterus.   Conjunctiva pink. Ears:  Normal auditory acuity. Nose:  No deformity, discharge, or lesions. Mouth:  No deformity or  lesions,oropharynx pink & moist. Neck:  Supple; no masses or thyromegaly. Lungs:  Respirations even and unlabored.  Clear throughout to auscultation.   No wheezes, crackles, or rhonchi. No acute distress. Heart:  Regular rate and rhythm; no murmurs, clicks, rubs, or gallops. Abdomen:  Normal bowel sounds. Soft, mild tenderness in epigastric area and non-distended without masses, hepatosplenomegaly or hernias noted.  No guarding or rebound tenderness.   Rectal: Not performed Msk:  Symmetrical without gross deformities. Good, equal movement & strength bilaterally. Pulses:  Normal pulses noted. Extremities:  No clubbing or edema.  No cyanosis. Neurologic:  Alert and oriented x3;  grossly normal neurologically. Skin:  Intact without significant lesions or rashes. No jaundice. Psych:  Alert and cooperative. Normal mood and affect.  Imaging Studies: None  Assessment and Plan:   Larry Lin is a 63 y.o. tall Caucasian male with history of testosterone deficiency, BPH seen in consultation for normocytic anemia, unintentional weight loss.  Patient also reports upper abdominal tenderness, early satiety, loss of appetite  Recommend urgent CT abdomen and pelvis to evaluate for pancreatic/intra-abdominal malignancy Recommend EGD and colonoscopy if CT is unremarkable Recommend iron studies, B12 and folate If above work-up is negative, will refer him to oncology for further evaluation   Follow up in 1 month   69, MD

## 2018-11-04 ENCOUNTER — Telehealth: Payer: Self-pay

## 2018-11-04 DIAGNOSIS — M961 Postlaminectomy syndrome, not elsewhere classified: Secondary | ICD-10-CM | POA: Diagnosis not present

## 2018-11-04 DIAGNOSIS — M5001 Cervical disc disorder with myelopathy,  high cervical region: Secondary | ICD-10-CM | POA: Diagnosis not present

## 2018-11-04 DIAGNOSIS — Z79899 Other long term (current) drug therapy: Secondary | ICD-10-CM | POA: Diagnosis not present

## 2018-11-04 DIAGNOSIS — M5011 Cervical disc disorder with radiculopathy,  high cervical region: Secondary | ICD-10-CM | POA: Diagnosis not present

## 2018-11-04 DIAGNOSIS — M542 Cervicalgia: Secondary | ICD-10-CM | POA: Diagnosis not present

## 2018-11-04 DIAGNOSIS — G894 Chronic pain syndrome: Secondary | ICD-10-CM | POA: Diagnosis not present

## 2018-11-04 DIAGNOSIS — F1120 Opioid dependence, uncomplicated: Secondary | ICD-10-CM | POA: Diagnosis not present

## 2018-11-04 DIAGNOSIS — M5440 Lumbago with sciatica, unspecified side: Secondary | ICD-10-CM | POA: Diagnosis not present

## 2018-11-04 LAB — CBC
Hematocrit: 41.6 % (ref 37.5–51.0)
Hemoglobin: 13.7 g/dL (ref 13.0–17.7)
MCH: 29.5 pg (ref 26.6–33.0)
MCHC: 32.9 g/dL (ref 31.5–35.7)
MCV: 90 fL (ref 79–97)
Platelets: 202 10*3/uL (ref 150–450)
RBC: 4.65 x10E6/uL (ref 4.14–5.80)
RDW: 13.1 % (ref 11.6–15.4)
WBC: 5.8 10*3/uL (ref 3.4–10.8)

## 2018-11-04 LAB — IRON AND TIBC
Iron Saturation: 37 % (ref 15–55)
Iron: 104 ug/dL (ref 38–169)
Total Iron Binding Capacity: 283 ug/dL (ref 250–450)
UIBC: 179 ug/dL (ref 111–343)

## 2018-11-04 LAB — FERRITIN: Ferritin: 483 ng/mL — ABNORMAL HIGH (ref 30–400)

## 2018-11-04 LAB — B12 AND FOLATE PANEL
Folate: >20 ng/mL (ref >3.0)
Vitamin B-12: 1452 pg/mL — ABNORMAL HIGH (ref 232–1245)

## 2018-11-04 LAB — TESTOSTERONE: Testosterone: 336 ng/dL (ref 264–916)

## 2018-11-04 NOTE — Telephone Encounter (Signed)
Left message for patient to call back to discuss results and recommendations. °

## 2018-11-04 NOTE — Telephone Encounter (Signed)
-----   Message from Nori Riis, PA-C sent at 11/04/2018  8:18 AM EDT ----- Please let Larry Lin know that his testosterone level is within normal limits, but he was also to leave an urine specimen to check for microscopic hematuria.  I do not see that this was completed, so he needs to come back and provide one.  It is important that he do so as microscopic blood in the urine is sometimes the only sign of a kidney or bladder cancer.

## 2018-11-05 ENCOUNTER — Telehealth: Payer: Self-pay | Admitting: Gastroenterology

## 2018-11-05 ENCOUNTER — Other Ambulatory Visit: Payer: Self-pay

## 2018-11-05 ENCOUNTER — Telehealth: Payer: Self-pay

## 2018-11-05 DIAGNOSIS — R634 Abnormal weight loss: Secondary | ICD-10-CM

## 2018-11-05 NOTE — Telephone Encounter (Signed)
Larry Lin from [facility] left vm regarding needing prior auth. For CT scan on 11/06/18 cb [phone]  Ext. [phone]

## 2018-11-05 NOTE — Telephone Encounter (Signed)
-----   Message from Zeppelin Beckstrand A McGowan, PA-C sent at 11/04/2018  8:18 AM EDT ----- Please let Larry Lin know that his testosterone level is within normal limits, but he was also to leave an urine specimen to check for microscopic hematuria.  I do not see that this was completed, so he needs to come back and provide one.  It is important that he do so as microscopic blood in the urine is sometimes the only sign of a kidney or bladder cancer. 

## 2018-11-05 NOTE — Telephone Encounter (Signed)
Attempted to reach patient again.  Left message for him to review mychart.

## 2018-11-06 ENCOUNTER — Ambulatory Visit: Payer: Medicare HMO

## 2018-11-06 ENCOUNTER — Ambulatory Visit: Admission: RE | Admit: 2018-11-06 | Payer: Medicare HMO | Source: Ambulatory Visit

## 2018-11-12 ENCOUNTER — Ambulatory Visit: Payer: Medicare HMO

## 2018-11-12 ENCOUNTER — Other Ambulatory Visit
Admission: RE | Admit: 2018-11-12 | Discharge: 2018-11-12 | Disposition: A | Discharge status: Home / Self Care | Payer: Medicare HMO | Source: Ambulatory Visit | Attending: Gastroenterology | Admitting: Gastroenterology

## 2018-11-12 ENCOUNTER — Other Ambulatory Visit: Payer: Self-pay

## 2018-11-12 ENCOUNTER — Ambulatory Visit
Admission: RE | Admit: 2018-11-12 | Discharge: 2018-11-12 | Disposition: A | Discharge status: Home / Self Care | Payer: Medicare HMO | Source: Ambulatory Visit | Attending: Gastroenterology | Admitting: Gastroenterology

## 2018-11-12 DIAGNOSIS — R634 Abnormal weight loss: Secondary | ICD-10-CM | POA: Diagnosis not present

## 2018-11-12 DIAGNOSIS — R109 Unspecified abdominal pain: Secondary | ICD-10-CM | POA: Diagnosis not present

## 2018-11-12 LAB — URINALYSIS, COMPLETE (UACMP) WITH MICROSCOPIC
Bilirubin Urine: NEGATIVE
Glucose, UA: NEGATIVE mg/dL
Hgb urine dipstick: NEGATIVE
Ketones, ur: NEGATIVE mg/dL
Leukocytes,Ua: NEGATIVE
Nitrite: NEGATIVE
Protein, ur: NEGATIVE mg/dL
Specific Gravity, Urine: 1.015 (ref 1.005–1.030)
pH: 5.5 (ref 5.0–8.0)

## 2018-11-12 LAB — CREATININE, SERUM
Creatinine, Ser: 0.77 mg/dL (ref 0.61–1.24)
GFR calc Af Amer: >60 mL/min (ref >60)
GFR calc non Af Amer: >60 mL/min (ref >60)

## 2018-11-12 MED ORDER — IOHEXOL 300 MG/ML  SOLN
100.0000 mL | Freq: Once | INTRAMUSCULAR | Status: AC | PRN
  Administered 2018-11-12: 100 mL via INTRAVENOUS

## 2018-11-17 ENCOUNTER — Ambulatory Visit: Payer: Medicare HMO

## 2018-11-17 ENCOUNTER — Other Ambulatory Visit: Admission: RE | Admit: 2018-11-17 | Payer: Medicare HMO | Source: Ambulatory Visit

## 2018-11-17 NOTE — Telephone Encounter (Signed)
Pt is calling he states he left a  vm for [place] last Friday to r/s his procedure please call pt he states he is still on the schedule

## 2018-11-17 NOTE — Telephone Encounter (Signed)
LVM with patient asking him to call me back to reschedule his EGD w/Colonoscopy scheduled with Dr. Marius Ditch on 11/20/18.  Thanks [employer]

## 2018-11-19 ENCOUNTER — Telehealth: Payer: Self-pay

## 2018-11-19 NOTE — Telephone Encounter (Signed)
Patient has contacted the office to reschedule his procedures from 11/20/18 to Monday 12/07/18 due to work schedule.  Patient has been advised of new COVID test date.Thursday November 12th. Pt will not be able to make COVID testing at designated time.  I've left message to let [facility] know that he will be there no later than 3pm Lequita in Endoscopy has been made aware of procedure date change.  Thanks [employer]

## 2018-11-23 ENCOUNTER — Telehealth: Payer: Self-pay

## 2018-11-23 NOTE — Telephone Encounter (Signed)
Returned patients call to reschedule his colonoscopy w/egd scheduled for Monday 12/07/18 with Dr. Marius Ditch to Friday 12/04/18 with Dr. Marius Ditch.  Patient has been informed of his new COVID Test date Tuesday 12/01/18.  [facility] in Endoscopy has been informed of patients procedure date change.  Thanks [employer]

## 2018-11-26 NOTE — Telephone Encounter (Signed)
Pt called office to let us know he left urine sample when he went to [facility] for his CT scan on 10/22.

## 2018-11-30 ENCOUNTER — Telehealth: Payer: Self-pay

## 2018-11-30 DIAGNOSIS — M5001 Cervical disc disorder with myelopathy,  high cervical region: Secondary | ICD-10-CM | POA: Diagnosis not present

## 2018-11-30 DIAGNOSIS — M961 Postlaminectomy syndrome, not elsewhere classified: Secondary | ICD-10-CM | POA: Diagnosis not present

## 2018-11-30 DIAGNOSIS — M5440 Lumbago with sciatica, unspecified side: Secondary | ICD-10-CM | POA: Diagnosis not present

## 2018-11-30 DIAGNOSIS — F1120 Opioid dependence, uncomplicated: Secondary | ICD-10-CM | POA: Diagnosis not present

## 2018-11-30 DIAGNOSIS — G894 Chronic pain syndrome: Secondary | ICD-10-CM | POA: Diagnosis not present

## 2018-11-30 DIAGNOSIS — M542 Cervicalgia: Secondary | ICD-10-CM | POA: Diagnosis not present

## 2018-11-30 DIAGNOSIS — M5011 Cervical disc disorder with radiculopathy,  high cervical region: Secondary | ICD-10-CM | POA: Diagnosis not present

## 2018-11-30 NOTE — Telephone Encounter (Signed)
Patients call has been returned.  LVM advising him to go for COVID Test tomorrow Tuesday 12/01/18 [facility] between 10:30am and 12:30pm for Colonoscopy on Friday 12/04/18.  Thanks [employer]

## 2018-12-01 ENCOUNTER — Telehealth: Payer: Self-pay

## 2018-12-01 ENCOUNTER — Encounter: Payer: Self-pay | Admitting: Urology

## 2018-12-01 ENCOUNTER — Other Ambulatory Visit: Payer: Self-pay

## 2018-12-01 ENCOUNTER — Other Ambulatory Visit
Admission: RE | Admit: 2018-12-01 | Discharge: 2018-12-01 | Disposition: A | Discharge status: Home / Self Care | Payer: Medicare HMO | Source: Ambulatory Visit | Attending: Gastroenterology | Admitting: Gastroenterology

## 2018-12-01 ENCOUNTER — Ambulatory Visit (INDEPENDENT_AMBULATORY_CARE_PROVIDER_SITE_OTHER): Payer: Medicare HMO | Admitting: Urology

## 2018-12-01 VITALS — BP 110/73 | HR 80 | Ht 74.0 in | Wt 150.9 lb

## 2018-12-01 DIAGNOSIS — R350 Frequency of micturition: Secondary | ICD-10-CM

## 2018-12-01 DIAGNOSIS — N411 Chronic prostatitis: Secondary | ICD-10-CM

## 2018-12-01 DIAGNOSIS — Z01812 Encounter for preprocedural laboratory examination: Secondary | ICD-10-CM | POA: Diagnosis not present

## 2018-12-01 DIAGNOSIS — Z20828 Contact with and (suspected) exposure to other viral communicable diseases: Secondary | ICD-10-CM | POA: Diagnosis not present

## 2018-12-01 DIAGNOSIS — N529 Male erectile dysfunction, unspecified: Secondary | ICD-10-CM | POA: Diagnosis not present

## 2018-12-01 DIAGNOSIS — R361 Hematospermia: Secondary | ICD-10-CM | POA: Diagnosis not present

## 2018-12-01 DIAGNOSIS — E349 Endocrine disorder, unspecified: Secondary | ICD-10-CM

## 2018-12-01 LAB — URINALYSIS, COMPLETE
Bilirubin, UA: NEGATIVE
Glucose, UA: NEGATIVE
Ketones, UA: NEGATIVE
Leukocytes,UA: NEGATIVE
Nitrite, UA: NEGATIVE
Protein,UA: NEGATIVE
RBC, UA: NEGATIVE
Specific Gravity, UA: 1.02 (ref 1.005–1.030)
Urobilinogen, Ur: 0.2 mg/dL (ref 0.2–1.0)
pH, UA: 6 (ref 5.0–7.5)

## 2018-12-01 LAB — MICROSCOPIC EXAMINATION
Bacteria, UA: NONE SEEN
Epithelial Cells (non renal): NONE SEEN /hpf (ref 0–10)
RBC, Urine: NONE SEEN /hpf (ref 0–2)

## 2018-12-01 MED ORDER — TADALAFIL 10 MG PO TABS: 10.0000 mg | ORAL_TABLET | Freq: Every day | ORAL | 0 refills | Status: DC | PRN

## 2018-12-01 NOTE — Progress Notes (Signed)
12/01/2018 3:42 PM   Larry Lin [birth year] [id number]  Referring provider: Emi Belfast, FNP [address] [place],  [place] [address]  Chief Complaint  Patient presents with  . Prostatitis    HPI: Patient is a 64 year old male with testosterone deficiency, ED, BPH with LU TS and nocturia who presents today for a follow up.    Testosterone deficiency He is having spontaneous erections at night when he takes the Cialis.  He does not have sleep apnea.   He found the AndroGel cost prohibitive, so he has not been on the gel for several months.   He has restarted the AndroGel at 2 pumps daily.  His most recent testosterone on November 03, 2018 was 336.   Erectile dysfunction He is requesting Cialis 10 mg, #90 to be sent to his pharmacy.    BPH WITH LUTS  (prostate and/or bladder) Previous score: 9/3   Previous PVR: 0 mL Major complaint(s):  Nocturia, urgency and a weak stream  x few years. Denies any dysuria or suprapubic pain.  He had one episode of blood, but he is not certain if it was in the semen or the urine.  Currently taking: Myrbetriq prn and Cialis 5 mg daily and finds that helpful.  Denies any recent fevers, chills, nausea or vomiting.  He does not have a family history of PCa.   PMH: Past Medical History:  Diagnosis Date  . Arthritis   . GERD (gastroesophageal reflux disease)   . Low testosterone     Surgical History: Past Surgical History:  Procedure Laterality Date  . BACK SURGERY  2006  . diverticulitis surgery     removed some of lower intestine  . NECK SURGERY  2015  . VASECTOMY     only right side done for nerve pain    Home Medications:  Allergies as of 12/01/2018   No Known Allergies     Medication List       Accurate as of December 01, 2018  3:42 PM. If you have any questions, ask your nurse or doctor.        ALPRAZolam 0.25 MG tablet Commonly known as: XANAX Take 0.25 mg by mouth at bedtime as needed for anxiety.    ascorbic acid 100 MG tablet Commonly known as: VITAMIN C Take 1 tablet by mouth daily.   cyclobenzaprine 10 MG tablet Commonly known as: FLEXERIL   MENS 50+ MULTI VITAMIN/MIN PO Take 1 tablet by mouth daily.   morphine 30 MG 12 hr tablet Commonly known as: MS CONTIN Take 1 tablet by mouth 2 (two) times a day.   pantoprazole 20 MG tablet Commonly known as: PROTONIX Take 1 tablet (20 mg total) by mouth daily.   tadalafil 10 MG tablet Commonly known as: CIALIS Take 1 tablet (10 mg total) by mouth daily as needed for erectile dysfunction.   Testosterone 20.25 MG/1.25GM (1.62%) Gel Apply two pumps (40.5 mg) to intact skin daily       Allergies: No Known Allergies  Family History: Family History  Problem Relation Age of Onset  . Leukemia Mother   . Healthy Father   . Kidney disease Neg Hx   . Prostate cancer Neg Hx     Social History:  reports that he quit smoking about 31 years ago. He has never used smokeless tobacco. He reports previous alcohol use of about 1.0 standard drinks of alcohol per week. He reports that he does not use drugs.  ROS: UROLOGY Frequent Urination?: Yes Hard to postpone urination?: Yes Burning/pain with urination?: No Get up at night to urinate?: Yes Leakage of urine?: No Urine stream starts and stops?: No Trouble starting stream?: No Do you have to strain to urinate?: No Blood in urine?: No Urinary tract infection?: No Sexually transmitted disease?: No Injury to kidneys or bladder?: No Painful intercourse?: No Weak stream?: No Erection problems?: No Penile pain?: No  Gastrointestinal Nausea?: No Vomiting?: No Indigestion/heartburn?: No Diarrhea?: No Constipation?: Yes  Constitutional Fever: No Night sweats?: No Weight loss?: No Fatigue?: Yes  Skin Skin rash/lesions?: No Itching?: No  Eyes Blurred vision?: No Double vision?: No  Ears/Nose/Throat Sore throat?: No Sinus problems?: No  Hematologic/Lymphatic Swollen  glands?: No Easy bruising?: No  Cardiovascular Leg swelling?: No Chest pain?: No  Respiratory Cough?: No Shortness of breath?: No  Endocrine Excessive thirst?: No  Musculoskeletal Back pain?: Yes Joint pain?: Yes  Neurological Headaches?: No Dizziness?: No  Psychologic Depression?: No Anxiety?: No  Physical Exam: BP 110/73   Pulse 80   Ht 6\' 2"  (1.88 m)   Wt 150 lb 14.4 oz (68.4 kg)   BMI 19.37 kg/m   Constitutional:  Well nourished. Alert and oriented, No acute distress. HEENT: [place] AT, moist mucus membranes.  Trachea midline, no masses. Cardiovascular: No clubbing, cyanosis, or edema. Respiratory: Normal respiratory effort, no increased work of breathing. Neurologic: Grossly intact, no focal deficits, moving all 4 extremities. Psychiatric: Normal mood and affect.  Laboratory Data: Lab Results  Component Value Date   WBC 5.8 11/03/2018   HGB 13.7 11/03/2018   HCT 41.6 11/03/2018   MCV 90 11/03/2018   PLT 202 11/03/2018    Lab Results  Component Value Date   CREATININE 0.77 11/12/2018    Lab Results  Component Value Date   TESTOSTERONE 336 11/03/2018    Lab Results  Component Value Date   HGBA1C 5.5 11/25/2014    Lab Results  Component Value Date   AST 14 09/01/2018   Lab Results  Component Value Date   ALT 10 09/01/2018   PSA     0.2 ng/mL on 04/25/2015  0.2 ng/mL on 02/13/2016  0.2 ng/mL on 03/26/2017  0.1 ng/mL on 09/05/2018    Urinalysis Component     Latest Ref Rng & Units 12/01/2018  Specific Gravity, UA     1.005 - 1.030 1.020  pH, UA     5.0 - 7.5 6.0  Color, UA     Yellow Yellow  Appearance Ur     Clear Clear  Leukocytes,UA     Negative Negative  Protein,UA     Negative/Trace Negative  Glucose, UA     Negative Negative  Ketones, UA     Negative Negative  RBC, UA     Negative Negative  Bilirubin, UA     Negative Negative  Urobilinogen, Ur     0.2 - 1.0 mg/dL 0.2  Nitrite, UA     Negative Negative   Microscopic Examination      See below:   Component     Latest Ref Rng & Units 12/01/2018          WBC, UA     0 - 5 /hpf 0-5  RBC     0 - 2 /hpf None seen  Epithelial Cells (non renal)     0 - 10 /hpf None seen  Bacteria, UA     None seen/Few None seen   I have reviewed the labs.  Assessment & Plan:    1. Hematospermia/Gross hematuria His UA today is negative for microscopic hematuria and he does not endorse any further hematospermia/gross hematuria  2. Testosterone deficiency Increase AndroGel 1.62%, to 4 pumps daily  He will return in 6 weeks for recheck on testosterone level  3. Erectile dysfunction Prescription given for Cialis 10 mg, #90   3. BPH with LUTS/Prostatitis Continue conservative management, avoiding bladder irritants and timed voiding's Most bothersome symptom is frequency Completed PT Continue Cialis 5 mg daily to pharmacy RTC in 6 months for I PSS, PSA, PVR and exam  4. Frequency Patient takes his Myrbetriq on a prn basis    Return in about 6 weeks (around 01/12/2019) for testosterone only .  These notes generated with voice recognition software. I apologize for typographical errors.  Zara Council, PA-C  [facility] [address] [place], [place] [address] [phone]

## 2018-12-01 NOTE — Telephone Encounter (Signed)
LVM with Pre-Admit testing to make them aware that patient will be arriving for his COVID test today between 2pm and 3pm due to his working out of town.  Thanks [employer]

## 2018-12-02 LAB — SARS CORONAVIRUS 2 (TAT 6-24 HRS): SARS Coronavirus 2: NEGATIVE

## 2018-12-04 ENCOUNTER — Ambulatory Visit: Payer: Medicare HMO | Admitting: Certified Registered Nurse Anesthetist

## 2018-12-04 ENCOUNTER — Encounter: Payer: Self-pay | Admitting: *Deleted

## 2018-12-04 ENCOUNTER — Encounter
Admission: RE | Disposition: A | Discharge status: Home / Self Care | Payer: Self-pay | Source: Home / Self Care | Attending: Gastroenterology

## 2018-12-04 ENCOUNTER — Ambulatory Visit
Admission: RE | Admit: 2018-12-04 | Discharge: 2018-12-04 | Disposition: A | Discharge status: Home / Self Care | Payer: Medicare HMO | Attending: Gastroenterology | Admitting: Gastroenterology

## 2018-12-04 DIAGNOSIS — Z87891 Personal history of nicotine dependence: Secondary | ICD-10-CM | POA: Insufficient documentation

## 2018-12-04 DIAGNOSIS — F419 Anxiety disorder, unspecified: Secondary | ICD-10-CM | POA: Diagnosis not present

## 2018-12-04 DIAGNOSIS — R6889 Other general symptoms and signs: Secondary | ICD-10-CM | POA: Diagnosis not present

## 2018-12-04 DIAGNOSIS — R1010 Upper abdominal pain, unspecified: Secondary | ICD-10-CM | POA: Insufficient documentation

## 2018-12-04 DIAGNOSIS — M1990 Unspecified osteoarthritis, unspecified site: Secondary | ICD-10-CM | POA: Diagnosis not present

## 2018-12-04 DIAGNOSIS — R634 Abnormal weight loss: Secondary | ICD-10-CM | POA: Insufficient documentation

## 2018-12-04 DIAGNOSIS — K5730 Diverticulosis of large intestine without perforation or abscess without bleeding: Secondary | ICD-10-CM | POA: Diagnosis not present

## 2018-12-04 DIAGNOSIS — K219 Gastro-esophageal reflux disease without esophagitis: Secondary | ICD-10-CM | POA: Insufficient documentation

## 2018-12-04 DIAGNOSIS — Z79899 Other long term (current) drug therapy: Secondary | ICD-10-CM | POA: Diagnosis not present

## 2018-12-04 DIAGNOSIS — R109 Unspecified abdominal pain: Secondary | ICD-10-CM | POA: Diagnosis not present

## 2018-12-04 DIAGNOSIS — K5790 Diverticulosis of intestine, part unspecified, without perforation or abscess without bleeding: Secondary | ICD-10-CM | POA: Diagnosis not present

## 2018-12-04 HISTORY — PX: ESOPHAGOGASTRODUODENOSCOPY (EGD) WITH PROPOFOL: SHX5813

## 2018-12-04 HISTORY — PX: COLONOSCOPY WITH PROPOFOL: SHX5780

## 2018-12-04 HISTORY — DX: Other hereditary ataxias: G11.8

## 2018-12-04 SURGERY — ESOPHAGOGASTRODUODENOSCOPY (EGD) WITH PROPOFOL
Anesthesia: General

## 2018-12-04 MED ORDER — PROPOFOL 10 MG/ML IV BOLUS
INTRAVENOUS | Status: DC | PRN
  Administered 2018-12-04: 20 mg via INTRAVENOUS
  Administered 2018-12-04: 50 mg via INTRAVENOUS
  Administered 2018-12-04: 30 mg via INTRAVENOUS

## 2018-12-04 MED ORDER — PROPOFOL 500 MG/50ML IV EMUL
INTRAVENOUS | Status: DC | PRN
  Administered 2018-12-04: 150 ug/kg/min via INTRAVENOUS

## 2018-12-04 MED ORDER — SODIUM CHLORIDE 0.9 % IV SOLN
INTRAVENOUS | Status: DC
  Administered 2018-12-04: 1000 mL via INTRAVENOUS

## 2018-12-04 MED ORDER — PHENYLEPHRINE HCL (PRESSORS) 10 MG/ML IV SOLN
INTRAVENOUS | Status: DC | PRN
  Administered 2018-12-04: 50 ug via INTRAVENOUS
  Administered 2018-12-04: 100 ug via INTRAVENOUS

## 2018-12-04 MED ORDER — LIDOCAINE HCL (CARDIAC) PF 100 MG/5ML IV SOSY
PREFILLED_SYRINGE | INTRAVENOUS | Status: DC | PRN
  Administered 2018-12-04: 50 mg via INTRATRACHEAL

## 2018-12-04 MED ORDER — PROPOFOL 500 MG/50ML IV EMUL
INTRAVENOUS | Status: AC
  Filled 2018-12-04: qty 50

## 2018-12-04 MED ORDER — LIDOCAINE HCL (PF) 2 % IJ SOLN
INTRAMUSCULAR | Status: AC
  Filled 2018-12-04: qty 10

## 2018-12-04 NOTE — Transfer of Care (Signed)
Immediate Anesthesia Transfer of Care Note  Patient: Larry Lin  Procedure(s) Performed: ESOPHAGOGASTRODUODENOSCOPY (EGD) WITH PROPOFOL (N/A ) COLONOSCOPY WITH PROPOFOL (N/A )  Patient Location: PACU  Anesthesia Type:General  Level of Consciousness: sedated  Airway & Oxygen Therapy: Patient Spontanous Breathing and Patient connected to nasal cannula oxygen  Post-op Assessment: Report given to RN and Post -op Vital signs reviewed and stable  Post vital signs: Reviewed and stable  Last Vitals:  Vitals Value Taken Time  BP 113/66 12/04/18 1002  Temp    Pulse 64 12/04/18 1004  Resp 19 12/04/18 1004  SpO2 99 % 12/04/18 1004  Vitals shown include unvalidated device data.  Last Pain:  Vitals:   12/04/18 0906  TempSrc: Temporal  PainSc: 0-No pain         Complications: No apparent anesthesia complications

## 2018-12-04 NOTE — H&P (Signed)
[name], MD [address]  [place]  [place], [place] [address]  Main: [phone]  Fax: [phone] Pager: [phone]  Primary Care Physician:  [name], FNP Primary Gastroenterologist:  Dr. [name]  Pre-Procedure History & Physical: HPI:  Larry Lin is a 63 y.o. male is here for an endoscopy and colonoscopy.   Past Medical History:  Diagnosis Date  . Arthritis   . 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 GERD (gastroesophageal reflux disease)   . Low testosterone     Past Surgical History:  Procedure Laterality Date  . BACK SURGERY  2006  . diverticulitis surgery     removed some of lower intestine  . NECK SURGERY  2015  . VASECTOMY     only right side done for nerve pain    Prior to Admission medications   Medication Sig Start Date End Date Taking? Authorizing Provider  morphine (MS CONTIN) 30 MG 12 hr tablet Take 1 tablet by mouth 2 (two) times a day. 07/16/18  Yes [provider]  ALPRAZolam Duanne Moron) 0.25 MG tablet Take 0.25 mg by mouth at bedtime as needed for anxiety.    [provider]  ascorbic acid (VITAMIN C) 100 MG tablet Take 1 tablet by mouth daily.    Roselee Nova, MD  cyclobenzaprine (FLEXERIL) 10 MG tablet  11/04/18   [provider]  Multiple Vitamins-Minerals (MENS 50+ MULTI VITAMIN/MIN PO) Take 1 tablet by mouth daily. 07/21/16   [provider]  pantoprazole (PROTONIX) 20 MG tablet Take 1 tablet (20 mg total) by mouth daily. 12/01/17   [name], FNP  tadalafil (CIALIS) 10 MG tablet Take 1 tablet (10 mg total) by mouth daily as needed for erectile dysfunction. 12/01/18   Nori Riis, PA-C  Testosterone 20.25 MG/1.25GM (1.62%) GEL Apply two pumps (40.5 mg) to intact skin daily 09/01/18   Zara Council A, PA-C    Allergies as of 11/03/2018  . (No Known Allergies)    Family History  Problem Relation Age of Onset  . Leukemia Mother   . Healthy Father    . Kidney disease Neg Hx   . Prostate cancer Neg Hx     Social History   Socioeconomic History  . Marital status: Divorced    Spouse name: Not on file  . Number of children: Not on file  . Years of education: Not on file  . Highest education level: Not on file  Occupational History  . Not on file  Social Needs  . Financial resource strain: Not on file  . Food insecurity    Worry: Not on file    Inability: Not on file  . Transportation needs    Medical: Not on file    Non-medical: Not on file  Tobacco Use  . Smoking status: Former Smoker    Quit date: 01/22/1987    Years since quitting: 31.8  . Smokeless tobacco: Never Used  Substance and Sexual Activity  . Alcohol use: Not Currently    Alcohol/week: 1.0 standard drinks    Types: 1 Cans of beer per week  . Drug use: No  . Sexual activity: Yes  Lifestyle  . Physical activity    Days per week: Not on file    Minutes per session: Not on file  . Stress: Not on file  Relationships  . Social Herbalist on phone: Not on file    Gets together: Not on file  Attends religious service: Not on file    Active member of club or organization: Not on file    Attends meetings of clubs or organizations: Not on file    Relationship status: Not on file  . Intimate partner violence    Fear of current or ex partner: Not on file    Emotionally abused: Not on file    Physically abused: Not on file    Forced sexual activity: Not on file  Other Topics Concern  . Not on file  Social History Narrative  . Not on file    Review of Systems: See HPI, otherwise negative ROS  Physical Exam: BP 105/65   Pulse 84   Temp 97.9 F (36.6 C) (Temporal)   Resp 18   Ht 6\' 2"  (1.88 m)   Wt 68 kg   BMI 19.26 kg/m  General:   Alert,  pleasant and cooperative in NAD Head:  Normocephalic and atraumatic. Neck:  Supple; no masses or thyromegaly. Lungs:  Clear throughout to auscultation.    Heart:  Regular rate and rhythm. Abdomen:   Soft, nontender and nondistended. Normal bowel sounds, without guarding, and without rebound.   Neurologic:  Alert and  oriented x4;  grossly normal neurologically.  Impression/Plan: Larry Lin is here for an endoscopy and colonoscopy to be performed for upper abdominal pain, unintentional weight loss  Risks, benefits, limitations, and alternatives regarding  endoscopy and colonoscopy have been reviewed with the patient.  Questions have been answered.  All parties agreeable.   [name], MD  12/04/2018, 9:26 AM

## 2018-12-04 NOTE — Anesthesia Preprocedure Evaluation (Signed)
Anesthesia Evaluation  Patient identified by MRN, date of birth, ID band Patient awake    Reviewed: Allergy & Precautions, H&P , NPO status , Patient's Chart, lab work & pertinent test results  History of Anesthesia Complications Negative for: history of anesthetic complications  Airway Mallampati: III  TM Distance: <3 FB Neck ROM: limited    Dental  (+) Chipped, Poor Dentition   Pulmonary neg pulmonary ROS, neg shortness of breath, former smoker,           Cardiovascular Exercise Tolerance: Good (-) angina(-) Past MI and (-) DOE + dysrhythmias      Neuro/Psych PSYCHIATRIC DISORDERS negative neurological ROS     GI/Hepatic Neg liver ROS, GERD  Medicated and Controlled,  Endo/Other  negative endocrine ROS  Renal/GU negative Renal ROS  negative genitourinary   Musculoskeletal  (+) Arthritis ,   Abdominal   Peds  Hematology negative hematology ROS (+)   Anesthesia Other Findings Past Medical History: No date: Arthritis No date: Corticostriatal-spinal degeneration ([facility]) No date: GERD (gastroesophageal reflux disease) No date: Low testosterone  Past Surgical History: 2006: BACK SURGERY No date: diverticulitis surgery     Comment:  removed some of lower intestine 2015: NECK SURGERY No date: VASECTOMY     Comment:  only right side done for nerve pain     Reproductive/Obstetrics negative OB ROS                             Anesthesia Physical Anesthesia Plan  ASA: III  Anesthesia Plan: General   Post-op Pain Management:    Induction: Intravenous  PONV Risk Score and Plan: Propofol infusion and TIVA  Airway Management Planned: Natural Airway and Nasal Cannula  Additional Equipment:   Intra-op Plan:   Post-operative Plan:   Informed Consent: I have reviewed the patients History and Physical, chart, labs and discussed the procedure including the risks, benefits and  alternatives for the proposed anesthesia with the patient or authorized representative who has indicated his/her understanding and acceptance.     Dental Advisory Given  Plan Discussed with: Anesthesiologist, CRNA and Surgeon  Anesthesia Plan Comments: (Patient consented for risks of anesthesia including but not limited to:  - adverse reactions to medications - risk of intubation if required - damage to teeth, lips or other oral mucosa - sore throat or hoarseness - Damage to heart, brain, lungs or loss of life  Patient voiced understanding.)        Anesthesia Quick Evaluation

## 2018-12-04 NOTE — Anesthesia Postprocedure Evaluation (Signed)
Anesthesia Post Note  Patient: Larry Lin  Procedure(s) Performed: ESOPHAGOGASTRODUODENOSCOPY (EGD) WITH PROPOFOL (N/A ) COLONOSCOPY WITH PROPOFOL (N/A )  Patient location during evaluation: Endoscopy Anesthesia Type: General Level of consciousness: awake and alert Pain management: pain level controlled Vital Signs Assessment: post-procedure vital signs reviewed and stable Respiratory status: spontaneous breathing, nonlabored ventilation, respiratory function stable and patient connected to nasal cannula oxygen Cardiovascular status: blood pressure returned to baseline and stable Postop Assessment: no apparent nausea or vomiting Anesthetic complications: no     Last Vitals:  Vitals:   12/04/18 1020 12/04/18 1030  BP: 110/73 110/73  Pulse: 64 60  Resp: 18 17  Temp:    SpO2: 100% 97%    Last Pain:  Vitals:   12/04/18 1000  TempSrc: Tympanic  PainSc:                  Precious Haws Luticia Tadros

## 2018-12-04 NOTE — Anesthesia Post-op Follow-up Note (Signed)
Anesthesia QCDR form completed.        

## 2018-12-04 NOTE — Op Note (Signed)
[facility] Patient Name: Larry Lin Procedure Date: 12/04/2018 9:31 AM MRN: [id number] Account #: [id number] Date of Birth: 1955-03-31 Admit Type: Outpatient Age: 63 Room: [facility] ENDO ROOM 3 Gender: Male Note Status: Finalized Procedure:             Upper GI endoscopy Indications:           Upper abdominal pain, Weight loss Providers:             Lin Landsman MD, MD Referring MD:          [name] (Referring MD) Medicines:             Monitored Anesthesia Care Complications:         No immediate complications. Estimated blood loss: None. Procedure:             Pre-Anesthesia Assessment:                        - Prior to the procedure, a History and Physical was                         performed, and patient medications and allergies were                         reviewed. The patient is competent. The risks and                         benefits of the procedure and the sedation options and                         risks were discussed with the patient. All questions                         were answered and informed consent was obtained.                         Patient identification and proposed procedure were                         verified by the physician, the nurse, the                         anesthesiologist, the anesthetist and the technician                         in the pre-procedure area in the procedure room in the                         endoscopy suite. Mental Status Examination: alert and                         oriented. Airway Examination: normal oropharyngeal                         airway and neck mobility. Respiratory Examination:                         clear to auscultation. CV Examination: normal.  Prophylactic Antibiotics: The patient does not require                         prophylactic antibiotics. Prior Anticoagulants: The                         patient has taken no previous  anticoagulant or                         antiplatelet agents. ASA Grade Assessment: III - A                         patient with severe systemic disease. After reviewing                         the risks and benefits, the patient was deemed in                         satisfactory condition to undergo the procedure. The                         anesthesia plan was to use monitored anesthesia care                         (MAC). Immediately prior to administration of                         medications, the patient was re-assessed for adequacy                         to receive sedatives. The heart rate, respiratory                         rate, oxygen saturations, blood pressure, adequacy of                         pulmonary ventilation, and response to care were                         monitored throughout the procedure. The physical                         status of the patient was re-assessed after the                         procedure.                        After obtaining informed consent, the endoscope was                         passed under direct vision. Throughout the procedure,                         the patient's blood pressure, pulse, and oxygen                         saturations were monitored continuously. The Endoscope  was introduced through the mouth, and advanced to the                         second part of duodenum. The upper GI endoscopy was                         accomplished without difficulty. The patient tolerated                         the procedure well. Findings:      The esophagus was normal.      The stomach was normal. Biopsies were taken with a cold forceps for       Helicobacter pylori testing.      The examined duodenum was normal. Biopsies for histology were taken with       a cold forceps for evaluation of celiac disease. Impression:            - Normal esophagus.                        - Normal stomach.                         - Normal examined duodenum.                        - No specimens collected. Recommendation:        - Await pathology results.                        - Proceed with colonoscopy as scheduled                        See colonoscopy report Procedure Code(s):     --- Professional ---                        [phone], Esophagogastroduodenoscopy, flexible,                         transoral; with biopsy, single or multiple Diagnosis Code(s):     --- Professional ---                        R10.10, Upper abdominal pain, unspecified                        R63.4, Abnormal weight loss CPT copyright 2019 American Medical Association. All rights reserved. The codes documented in this report are preliminary and upon coder review may  be revised to meet current compliance requirements. Dr. Ulyess Mort Lin Landsman MD, MD 12/04/2018 9:46:17 AM This report has been signed electronically. Number of Addenda: 0 Note Initiated On: 12/04/2018 9:31 AM Estimated Blood Loss:  Estimated blood loss: none.      [facility]

## 2018-12-04 NOTE — Op Note (Signed)
[facility] Patient Name: Larry Lin Procedure Date: 12/04/2018 9:32 AM MRN: [id number] Account #: [id number] Date of Birth: 11-01-1955 Admit Type: Outpatient Age: 63 Room: [facility] ENDO ROOM 3 Gender: Male Note Status: Finalized Procedure:             Colonoscopy Indications:           Upper abdominal pain, Weight loss Providers:             Lin Landsman MD, MD Referring MD:          [name] (Referring MD) Medicines:             Monitored Anesthesia Care Complications:         No immediate complications. Estimated blood loss: None. Procedure:             Pre-Anesthesia Assessment:                        - Prior to the procedure, a History and Physical was                         performed, and patient medications and allergies were                         reviewed. The patient is competent. The risks and                         benefits of the procedure and the sedation options and                         risks were discussed with the patient. All questions                         were answered and informed consent was obtained.                         Patient identification and proposed procedure were                         verified by the physician, the nurse, the                         anesthesiologist, the anesthetist and the technician                         in the pre-procedure area in the procedure room in the                         endoscopy suite. Mental Status Examination: alert and                         oriented. Airway Examination: normal oropharyngeal                         airway and neck mobility. Respiratory Examination:                         clear to auscultation. CV Examination: normal.  Prophylactic Antibiotics: The patient does not require                         prophylactic antibiotics. Prior Anticoagulants: The                         patient has taken no previous  anticoagulant or                         antiplatelet agents. ASA Grade Assessment: III - A                         patient with severe systemic disease. After reviewing                         the risks and benefits, the patient was deemed in                         satisfactory condition to undergo the procedure. The                         anesthesia plan was to use monitored anesthesia care                         (MAC). Immediately prior to administration of                         medications, the patient was re-assessed for adequacy                         to receive sedatives. The heart rate, respiratory                         rate, oxygen saturations, blood pressure, adequacy of                         pulmonary ventilation, and response to care were                         monitored throughout the procedure. The physical                         status of the patient was re-assessed after the                         procedure.                        After obtaining informed consent, the colonoscope was                         passed under direct vision. Throughout the procedure,                         the patient's blood pressure, pulse, and oxygen                         saturations were monitored continuously. The  Colonoscope was introduced through the anus and                         advanced to the the cecum, identified by appendiceal                         orifice and ileocecal valve. The colonoscopy was                         performed without difficulty. The patient tolerated                         the procedure well. The quality of the bowel                         preparation was evaluated using the BBPS [facility] Bowel                         Preparation Scale) with scores of: Right Colon = 3,                         Transverse Colon = 3 and Left Colon = 3 (entire mucosa                         seen well with no residual staining, small  fragments                         of stool or opaque liquid). The total BBPS score                         equals 9. Findings:      The perianal and digital rectal examinations were normal. Pertinent       negatives include normal sphincter tone and no palpable rectal lesions.      Multiple diverticula were found in the sigmoid colon, transverse colon       and ascending colon.      The retroflexed view of the distal rectum and anal verge was normal and       showed no anal or rectal abnormalities. Impression:            - Diverticulosis in the sigmoid colon, in the                         transverse colon and in the ascending colon.                        - The distal rectum and anal verge are normal on                         retroflexion view.                        - No specimens collected. Recommendation:        - Discharge patient to home (with escort).                        - Resume regular diet today.                        -  Continue present medications.                        - Return to my office as previously scheduled. Procedure Code(s):     --- Professional ---                        [phone], Colonoscopy, flexible; diagnostic, including                         collection of specimen(s) by brushing or washing, when                         performed (separate procedure) Diagnosis Code(s):     --- Professional ---                        R10.10, Upper abdominal pain, unspecified                        R63.4, Abnormal weight loss                        K57.30, Diverticulosis of large intestine without                         perforation or abscess without bleeding CPT copyright 2019 American Medical Association. All rights reserved. The codes documented in this report are preliminary and upon coder review may  be revised to meet current compliance requirements. Dr. Ulyess Mort Lin Landsman MD, MD 12/04/2018 9:58:52 AM This report has been signed  electronically. Number of Addenda: 0 Note Initiated On: 12/04/2018 9:32 AM Scope Withdrawal Time: 0 hours 5 minutes 41 seconds  Total Procedure Duration: 0 hours 8 minutes 58 seconds  Estimated Blood Loss:  Estimated blood loss: none.      [facility]

## 2018-12-07 ENCOUNTER — Encounter: Payer: Self-pay | Admitting: Gastroenterology

## 2018-12-07 LAB — SURGICAL PATHOLOGY

## 2018-12-09 ENCOUNTER — Other Ambulatory Visit: Payer: Self-pay

## 2018-12-09 ENCOUNTER — Telehealth: Payer: Self-pay | Admitting: Gastroenterology

## 2018-12-09 ENCOUNTER — Ambulatory Visit: Payer: Medicare HMO | Admitting: Urology

## 2018-12-09 DIAGNOSIS — E349 Endocrine disorder, unspecified: Secondary | ICD-10-CM

## 2018-12-09 NOTE — Telephone Encounter (Signed)
-----   Message from Rohini Reddy Vanga, MD sent at 12/07/2018 12:56 PM EST ----- °Regarding: Follow-up °Please make a follow-up to see me in about 2 months ° °RV ° °

## 2018-12-09 NOTE — Telephone Encounter (Signed)
Left vm to offer 2 month f/u apt with Dr. Marius Ditch

## 2018-12-15 ENCOUNTER — Telehealth: Payer: Self-pay | Admitting: Gastroenterology

## 2018-12-15 NOTE — Telephone Encounter (Signed)
Left vm  To offer apt with Dr. Marius Ditch for 2 month

## 2018-12-15 NOTE — Telephone Encounter (Signed)
-----   Message from Lin Landsman, MD sent at 12/07/2018 12:56 PM EST ----- Regarding: Follow-up Please make a follow-up to see me in about 2 months  RV

## 2018-12-16 MED ORDER — TESTOSTERONE 20.25 MG/1.25GM (1.62%) TD GEL: TRANSDERMAL | 2 refills | Status: DC

## 2018-12-22 ENCOUNTER — Other Ambulatory Visit: Payer: Self-pay | Admitting: Family Medicine

## 2018-12-22 ENCOUNTER — Encounter: Payer: Self-pay | Admitting: Gastroenterology

## 2018-12-22 DIAGNOSIS — E349 Endocrine disorder, unspecified: Secondary | ICD-10-CM

## 2018-12-25 ENCOUNTER — Telehealth: Payer: Self-pay | Admitting: Urology

## 2018-12-25 DIAGNOSIS — E349 Endocrine disorder, unspecified: Secondary | ICD-10-CM

## 2018-12-25 NOTE — Telephone Encounter (Signed)
Pt called and requests that his Testosterone refill be changed to 4 pumps instead of 2. He also states that he is out of town and needs it sent to [facility] in [place], [place].

## 2018-12-25 NOTE — Telephone Encounter (Signed)
That will be fine to change the script to 4 pumps daily.

## 2018-12-28 MED ORDER — TESTOSTERONE 20.25 MG/1.25GM (1.62%) TD GEL: TRANSDERMAL | 2 refills | Status: DC

## 2018-12-28 NOTE — Telephone Encounter (Signed)
Rx printed

## 2018-12-31 ENCOUNTER — Other Ambulatory Visit: Payer: Self-pay | Admitting: Urology

## 2018-12-31 DIAGNOSIS — N529 Male erectile dysfunction, unspecified: Secondary | ICD-10-CM

## 2019-01-04 DIAGNOSIS — M5440 Lumbago with sciatica, unspecified side: Secondary | ICD-10-CM | POA: Diagnosis not present

## 2019-01-04 DIAGNOSIS — M542 Cervicalgia: Secondary | ICD-10-CM | POA: Diagnosis not present

## 2019-01-04 DIAGNOSIS — M5001 Cervical disc disorder with myelopathy,  high cervical region: Secondary | ICD-10-CM | POA: Diagnosis not present

## 2019-01-04 DIAGNOSIS — M961 Postlaminectomy syndrome, not elsewhere classified: Secondary | ICD-10-CM | POA: Diagnosis not present

## 2019-01-04 DIAGNOSIS — F1120 Opioid dependence, uncomplicated: Secondary | ICD-10-CM | POA: Diagnosis not present

## 2019-01-04 DIAGNOSIS — M5011 Cervical disc disorder with radiculopathy,  high cervical region: Secondary | ICD-10-CM | POA: Diagnosis not present

## 2019-01-04 DIAGNOSIS — G894 Chronic pain syndrome: Secondary | ICD-10-CM | POA: Diagnosis not present

## 2019-01-07 ENCOUNTER — Other Ambulatory Visit: Payer: Self-pay | Admitting: *Deleted

## 2019-01-07 DIAGNOSIS — E349 Endocrine disorder, unspecified: Secondary | ICD-10-CM

## 2019-01-07 NOTE — Progress Notes (Signed)
tes

## 2019-01-08 ENCOUNTER — Telehealth: Payer: Self-pay | Admitting: Urology

## 2019-01-08 NOTE — Telephone Encounter (Signed)
Pt asking for the quantity of his testosterone to be changed so that he gets 2 bottles at once. Please advise pt.

## 2019-01-10 ENCOUNTER — Other Ambulatory Visit: Payer: Self-pay | Admitting: Urology

## 2019-01-10 DIAGNOSIS — E349 Endocrine disorder, unspecified: Secondary | ICD-10-CM

## 2019-01-10 MED ORDER — TESTOSTERONE 20.25 MG/1.25GM (1.62%) TD GEL: TRANSDERMAL | 2 refills | Status: DC

## 2019-01-10 NOTE — Telephone Encounter (Signed)
I tried to correct the testosterone for the two bottles at a time, but it printed.

## 2019-01-12 ENCOUNTER — Other Ambulatory Visit: Payer: Medicare HMO

## 2019-02-01 DIAGNOSIS — Z79899 Other long term (current) drug therapy: Secondary | ICD-10-CM | POA: Diagnosis not present

## 2019-02-01 DIAGNOSIS — M5440 Lumbago with sciatica, unspecified side: Secondary | ICD-10-CM | POA: Diagnosis not present

## 2019-02-01 DIAGNOSIS — M5001 Cervical disc disorder with myelopathy,  high cervical region: Secondary | ICD-10-CM | POA: Diagnosis not present

## 2019-02-01 DIAGNOSIS — M5011 Cervical disc disorder with radiculopathy,  high cervical region: Secondary | ICD-10-CM | POA: Diagnosis not present

## 2019-02-01 DIAGNOSIS — M542 Cervicalgia: Secondary | ICD-10-CM | POA: Diagnosis not present

## 2019-02-01 DIAGNOSIS — G894 Chronic pain syndrome: Secondary | ICD-10-CM | POA: Diagnosis not present

## 2019-02-01 DIAGNOSIS — M961 Postlaminectomy syndrome, not elsewhere classified: Secondary | ICD-10-CM | POA: Diagnosis not present

## 2019-02-01 DIAGNOSIS — F1120 Opioid dependence, uncomplicated: Secondary | ICD-10-CM | POA: Diagnosis not present

## 2019-02-16 ENCOUNTER — Other Ambulatory Visit: Payer: Medicare HMO

## 2019-02-18 ENCOUNTER — Telehealth: Payer: Self-pay | Admitting: Urology

## 2019-02-18 NOTE — Telephone Encounter (Signed)
Pt asking for corrected Rx for testosterone to be sent to [facility] in [place]. Please advise pt.   Thanks

## 2019-02-18 NOTE — Telephone Encounter (Signed)
LMOM informed patient I called [facility] and corrected the Testosterone RX.

## 2019-03-08 DIAGNOSIS — F1120 Opioid dependence, uncomplicated: Secondary | ICD-10-CM | POA: Diagnosis not present

## 2019-03-08 DIAGNOSIS — G894 Chronic pain syndrome: Secondary | ICD-10-CM | POA: Diagnosis not present

## 2019-03-08 DIAGNOSIS — M961 Postlaminectomy syndrome, not elsewhere classified: Secondary | ICD-10-CM | POA: Diagnosis not present

## 2019-03-08 DIAGNOSIS — M5011 Cervical disc disorder with radiculopathy,  high cervical region: Secondary | ICD-10-CM | POA: Diagnosis not present

## 2019-03-08 DIAGNOSIS — M542 Cervicalgia: Secondary | ICD-10-CM | POA: Diagnosis not present

## 2019-03-08 DIAGNOSIS — M5440 Lumbago with sciatica, unspecified side: Secondary | ICD-10-CM | POA: Diagnosis not present

## 2019-03-08 DIAGNOSIS — Z79899 Other long term (current) drug therapy: Secondary | ICD-10-CM | POA: Diagnosis not present

## 2019-03-08 DIAGNOSIS — M5001 Cervical disc disorder with myelopathy,  high cervical region: Secondary | ICD-10-CM | POA: Diagnosis not present

## 2019-04-05 DIAGNOSIS — F1120 Opioid dependence, uncomplicated: Secondary | ICD-10-CM | POA: Diagnosis not present

## 2019-04-05 DIAGNOSIS — M5440 Lumbago with sciatica, unspecified side: Secondary | ICD-10-CM | POA: Diagnosis not present

## 2019-04-05 DIAGNOSIS — M5011 Cervical disc disorder with radiculopathy,  high cervical region: Secondary | ICD-10-CM | POA: Diagnosis not present

## 2019-04-05 DIAGNOSIS — M961 Postlaminectomy syndrome, not elsewhere classified: Secondary | ICD-10-CM | POA: Diagnosis not present

## 2019-04-05 DIAGNOSIS — M542 Cervicalgia: Secondary | ICD-10-CM | POA: Diagnosis not present

## 2019-04-05 DIAGNOSIS — Z79899 Other long term (current) drug therapy: Secondary | ICD-10-CM | POA: Diagnosis not present

## 2019-04-05 DIAGNOSIS — M5001 Cervical disc disorder with myelopathy,  high cervical region: Secondary | ICD-10-CM | POA: Diagnosis not present

## 2019-04-05 DIAGNOSIS — G894 Chronic pain syndrome: Secondary | ICD-10-CM | POA: Diagnosis not present

## 2019-04-06 ENCOUNTER — Other Ambulatory Visit: Payer: Medicare HMO

## 2019-04-21 ENCOUNTER — Telehealth: Payer: Self-pay | Admitting: Urology

## 2019-04-21 DIAGNOSIS — N529 Male erectile dysfunction, unspecified: Secondary | ICD-10-CM

## 2019-04-21 MED ORDER — TADALAFIL 10 MG PO TABS: 10.0000 mg | ORAL_TABLET | Freq: Every day | ORAL | 0 refills | Status: DC | PRN

## 2019-04-21 NOTE — Telephone Encounter (Signed)
Pt asking for 90 day supply refill of his Tadalafil. Please advise. Thanks.

## 2019-04-27 ENCOUNTER — Other Ambulatory Visit: Payer: Medicare HMO

## 2019-04-30 ENCOUNTER — Other Ambulatory Visit: Payer: Medicare HMO

## 2019-04-30 ENCOUNTER — Other Ambulatory Visit: Payer: Self-pay

## 2019-04-30 DIAGNOSIS — M25551 Pain in right hip: Secondary | ICD-10-CM | POA: Diagnosis not present

## 2019-04-30 DIAGNOSIS — M7061 Trochanteric bursitis, right hip: Secondary | ICD-10-CM | POA: Diagnosis not present

## 2019-04-30 DIAGNOSIS — E349 Endocrine disorder, unspecified: Secondary | ICD-10-CM | POA: Diagnosis not present

## 2019-04-30 DIAGNOSIS — M25512 Pain in left shoulder: Secondary | ICD-10-CM | POA: Diagnosis not present

## 2019-04-30 DIAGNOSIS — M19012 Primary osteoarthritis, left shoulder: Secondary | ICD-10-CM | POA: Diagnosis not present

## 2019-05-01 LAB — TESTOSTERONE: Testosterone: 127 ng/dL — ABNORMAL LOW (ref 264–916)

## 2019-05-03 DIAGNOSIS — M542 Cervicalgia: Secondary | ICD-10-CM | POA: Diagnosis not present

## 2019-05-03 DIAGNOSIS — M5001 Cervical disc disorder with myelopathy,  high cervical region: Secondary | ICD-10-CM | POA: Diagnosis not present

## 2019-05-03 DIAGNOSIS — M961 Postlaminectomy syndrome, not elsewhere classified: Secondary | ICD-10-CM | POA: Diagnosis not present

## 2019-05-03 DIAGNOSIS — F1120 Opioid dependence, uncomplicated: Secondary | ICD-10-CM | POA: Diagnosis not present

## 2019-05-03 DIAGNOSIS — M5440 Lumbago with sciatica, unspecified side: Secondary | ICD-10-CM | POA: Diagnosis not present

## 2019-05-03 DIAGNOSIS — M5011 Cervical disc disorder with radiculopathy,  high cervical region: Secondary | ICD-10-CM | POA: Diagnosis not present

## 2019-05-03 DIAGNOSIS — G894 Chronic pain syndrome: Secondary | ICD-10-CM | POA: Diagnosis not present

## 2019-05-03 DIAGNOSIS — Z79899 Other long term (current) drug therapy: Secondary | ICD-10-CM | POA: Diagnosis not present

## 2019-05-20 ENCOUNTER — Other Ambulatory Visit: Payer: Self-pay

## 2019-05-20 DIAGNOSIS — E349 Endocrine disorder, unspecified: Secondary | ICD-10-CM

## 2019-05-26 ENCOUNTER — Ambulatory Visit (INDEPENDENT_AMBULATORY_CARE_PROVIDER_SITE_OTHER): Payer: Medicare HMO | Admitting: Urology

## 2019-05-26 ENCOUNTER — Encounter: Payer: Self-pay | Admitting: Urology

## 2019-05-26 ENCOUNTER — Other Ambulatory Visit: Payer: Self-pay

## 2019-05-26 VITALS — BP 118/78 | HR 70 | Ht 74.0 in | Wt 150.0 lb

## 2019-05-26 DIAGNOSIS — N529 Male erectile dysfunction, unspecified: Secondary | ICD-10-CM | POA: Diagnosis not present

## 2019-05-26 DIAGNOSIS — R350 Frequency of micturition: Secondary | ICD-10-CM | POA: Diagnosis not present

## 2019-05-26 DIAGNOSIS — E349 Endocrine disorder, unspecified: Secondary | ICD-10-CM

## 2019-05-26 DIAGNOSIS — N138 Other obstructive and reflux uropathy: Secondary | ICD-10-CM

## 2019-05-26 DIAGNOSIS — N401 Enlarged prostate with lower urinary tract symptoms: Secondary | ICD-10-CM

## 2019-05-26 MED ORDER — GEMTESA 75 MG PO TABS: 1.0000 | ORAL_TABLET | Freq: Every day | ORAL | 0 refills | Status: DC

## 2019-05-26 MED ORDER — TADALAFIL 5 MG PO TABS: 5.0000 mg | ORAL_TABLET | Freq: Every day | ORAL | 0 refills | Status: DC | PRN

## 2019-05-26 MED ORDER — TESTOSTERONE 20.25 MG/1.25GM (1.62%) TD GEL: TRANSDERMAL | 2 refills | Status: DC

## 2019-05-26 NOTE — Progress Notes (Signed)
05/26/2019 1:12 PM   Larry Lin 1955/07/14 [id number]  Referring provider: Emi Belfast, FNP [address] [place],  [place] [address]  Chief Complaint  Patient presents with  . Medication Refill    HPI: Patient is a 64 year old male with testosterone deficiency, ED, BPH with LU TS and nocturia who presents today for a follow up.    Testosterone deficiency He is having spontaneous erections at night when he takes the Cialis.  He does not have sleep apnea.   He is using AndroGel 1.62% 4 pumps daily.  He has only been applying it to his thighs.   Component     Latest Ref Rng & Units 04/30/2019  Testosterone     264 - 916 ng/dL 026 (L)   Erectile dysfunction SHIM score: 19    Previous SHIM score: 12  Main complaint: Achieving and maintaining erections x few years Risk factors: age, BPH and testosterone deficiency    No painful erections or curvatures with his erections.    Still having spontaneous erections Tried:  Cialis - effective    SHIM    Row Name 05/26/19 1304         SHIM: Over the last 6 months:   How do you rate your confidence that you could get and keep an erection?  Moderate     When you had erections with sexual stimulation, how often were your erections hard enough for penetration (entering your partner)?  Most Times (much more than half the time)     During sexual intercourse, how often were you able to maintain your erection after you had penetrated (entered) your partner?  Most Times (much more than half the time)     During sexual intercourse, how difficult was it to maintain your erection to completion of intercourse?  Slightly Difficult     When you attempted sexual intercourse, how often was it satisfactory for you?  Most Times (much more than half the time)       SHIM Total Score   SHIM  19        Score: 1-7 Severe ED 8-11 Moderate ED 12-16 Mild-Moderate ED 17-21 Mild ED 22-25 No ED  BPH WITH LUTS  (prostate and/or  bladder) I PSS score: 13/4    Previous score: 9/3   Previous PVR: 0 mL   Major complaint(s):  Frequency   Patient denies any modifying or aggravating factors.  Patient denies any gross hematuria, dysuria or suprapubic/flank pain.  Patient denies any fevers, chills, nausea or vomiting.   IPSS    Row Name 05/26/19 1300         International Prostate Symptom Score   How often have you had the sensation of not emptying your bladder?  About half the time     How often have you had to urinate less than every two hours?  About half the time     How often have you found you stopped and started again several times when you urinated?  Not at All     How often have you found it difficult to postpone urination?  More than half the time     How often have you had a weak urinary stream?  Less than 1 in 5 times     How often have you had to strain to start urination?  Less than 1 in 5 times     How many times did you typically get up at night  to urinate?  1 Time     Total IPSS Score  13       Quality of Life due to urinary symptoms   If you were to spend the rest of your life with your urinary condition just the way it is now how would you feel about that?  Mostly Disatisfied       PMH: Past Medical History:  Diagnosis Date  . Arthritis   . Corticostriatal-spinal degeneration (HCC)   . GERD (gastroesophageal reflux disease)   . Low testosterone     Surgical History: Past Surgical History:  Procedure Laterality Date  . BACK SURGERY  2006  . COLONOSCOPY WITH PROPOFOL N/A 12/04/2018   Procedure: COLONOSCOPY WITH PROPOFOL;  Surgeon: Toney Reil, MD;  Location: [facility] ENDOSCOPY;  Service: Gastroenterology;  Laterality: N/A;  . diverticulitis surgery     removed some of lower intestine  . ESOPHAGOGASTRODUODENOSCOPY (EGD) WITH PROPOFOL N/A 12/04/2018   Procedure: ESOPHAGOGASTRODUODENOSCOPY (EGD) WITH PROPOFOL;  Surgeon: Toney Reil, MD;  Location: [facility] ENDOSCOPY;  Service:  Gastroenterology;  Laterality: N/A;  . NECK SURGERY  2015  . VASECTOMY     only right side done for nerve pain    Home Medications:  Allergies as of 05/26/2019   No Known Allergies     Medication List       Accurate as of May 26, 2019  1:12 PM. If you have any questions, ask your nurse or doctor.        ALPRAZolam 0.25 MG tablet Commonly known as: XANAX Take 0.25 mg by mouth at bedtime as needed for anxiety.   ascorbic acid 100 MG tablet Commonly known as: VITAMIN C Take 1 tablet by mouth daily.   cyclobenzaprine 10 MG tablet Commonly known as: FLEXERIL   MENS 50+ MULTI VITAMIN/MIN PO Take 1 tablet by mouth daily.   morphine 30 MG 12 hr tablet Commonly known as: MS CONTIN Take 1 tablet by mouth 2 (two) times a day.   pantoprazole 20 MG tablet Commonly known as: PROTONIX Take 1 tablet (20 mg total) by mouth daily.   tadalafil 10 MG tablet Commonly known as: CIALIS Take 1 tablet (10 mg total) by mouth daily as needed for erectile dysfunction.   Testosterone 20.25 MG/1.25GM (1.62%) Gel Apply 4 pumps (81mg ) to intact skin daily       Allergies: No Known Allergies  Family History: Family History  Problem Relation Age of Onset  . Leukemia Mother   . Healthy Father   . Kidney disease Neg Hx   . Prostate cancer Neg Hx     Social History:  reports that he quit smoking about 32 years ago. He has never used smokeless tobacco. He reports previous alcohol use of about 1.0 standard drinks of alcohol per week. He reports that he does not use drugs.  ROS: For pertinent review of systems please refer to history of present illness  Physical Exam: BP 118/78   Pulse 70   Ht 6\' 2"  (1.88 m)   Wt 150 lb (68 kg)   BMI 19.26 kg/m   Constitutional:  Well nourished. Alert and oriented, No acute distress. HEENT: [place] AT, moist mucus membranes.  Trachea midline, no masses. Cardiovascular: No clubbing, cyanosis, or edema. Respiratory: Normal respiratory effort, no increased  work of breathing. Neurologic: Grossly intact, no focal deficits, moving all 4 extremities. Psychiatric: Normal mood and affect.   Laboratory Data: Lab Results  Component Value Date   WBC 5.8 11/03/2018  HGB 13.7 11/03/2018   HCT 41.6 11/03/2018   MCV 90 11/03/2018   PLT 202 11/03/2018    Lab Results  Component Value Date   CREATININE 0.77 11/12/2018    Lab Results  Component Value Date   TESTOSTERONE 127 (L) 04/30/2019    Lab Results  Component Value Date   HGBA1C 5.5 11/25/2014    Lab Results  Component Value Date   AST 14 09/01/2018   Lab Results  Component Value Date   ALT 10 09/01/2018   PSA     0.2 ng/mL on 04/25/2015  0.2 ng/mL on 02/13/2016  0.2 ng/mL on 03/26/2017  0.1 ng/mL on 09/05/2018    Urinalysis Component     Latest Ref Rng & Units 05/26/2019  Specific Gravity, UA     1.005 - 1.030 1.020  pH, UA     5.0 - 7.5 5.0  Color, UA     Yellow Yellow  Appearance Ur     Clear Clear  Leukocytes,UA     Negative Negative  Protein,UA     Negative/Trace Negative  Glucose, UA     Negative Negative  Ketones, UA     Negative Negative  RBC, UA     Negative Negative  Bilirubin, UA     Negative Negative  Urobilinogen, Ur     0.2 - 1.0 mg/dL 0.2  Nitrite, UA     Negative Negative  Microscopic Examination      See below:   Component     Latest Ref Rng & Units 05/26/2019          WBC, UA     0 - 5 /hpf 0-5  RBC     0 - 2 /hpf None seen  Epithelial Cells (non renal)     0 - 10 /hpf None seen  Bacteria, UA     None seen/Few None seen   I have reviewed the labs.     Assessment & Plan:    1. Testosterone deficiency Explained to patient that some men are just nonresponders to the topical gel or it may be that he is applying the gel the same location every time We discussed alternating application sites or switching to another type of testosterone therapy He would like a trial of alternating the application sites He will return in June  for a recheck on his testosterone level  2. Erectile dysfunction - SHIM score is 19, it is improving - He would like to try Cialis 5 mg daily, prescription sent to pharmacy - RTC in 6 months for repeat SHIM score and exam   3. BPH with LUTS Continue conservative management, avoiding bladder irritants and timed voiding's Most bothersome symptom is frequency Completed PT Encouraged patient to take Cialis 5 mg daily  RTC in 6 months for I PSS, PVR and exam  4. Frequency Only taking Myrbetriq once weekly, but he finds it effective when he takes it We will have a trial of Gemtesa 74 mg in hopes that his insurance will cover this medication RTC in June for I PSS and exam    No follow-ups on file.  These notes generated with voice recognition software. I apologize for typographical errors.  Zara Council, PA-C  [facility] [address] [place], [place] [address] [phone]

## 2019-05-27 ENCOUNTER — Telehealth: Payer: Self-pay | Admitting: Family Medicine

## 2019-05-27 LAB — MICROSCOPIC EXAMINATION
Bacteria, UA: NONE SEEN
Epithelial Cells (non renal): NONE SEEN /hpf (ref 0–10)
RBC, Urine: NONE SEEN /hpf (ref 0–2)

## 2019-05-27 LAB — URINALYSIS, COMPLETE
Bilirubin, UA: NEGATIVE
Glucose, UA: NEGATIVE
Ketones, UA: NEGATIVE
Leukocytes,UA: NEGATIVE
Nitrite, UA: NEGATIVE
Protein,UA: NEGATIVE
RBC, UA: NEGATIVE
Specific Gravity, UA: 1.02 (ref 1.005–1.030)
Urobilinogen, Ur: 0.2 mg/dL (ref 0.2–1.0)
pH, UA: 5 (ref 5.0–7.5)

## 2019-05-27 LAB — PSA: Prostate Specific Ag, Serum: 0.2 ng/mL (ref 0.0–4.0)

## 2019-05-27 LAB — HEMOGLOBIN: Hemoglobin: 13.9 g/dL (ref 13.0–17.7)

## 2019-05-27 LAB — TESTOSTERONE: Testosterone: 535 ng/dL (ref 264–916)

## 2019-05-27 LAB — HEMATOCRIT: Hematocrit: 40.8 % (ref 37.5–51.0)

## 2019-05-27 NOTE — Telephone Encounter (Signed)
LMOM informed of good lab work.

## 2019-05-27 NOTE — Telephone Encounter (Signed)
-----   Message from [name], PA-C sent at 05/27/2019  7:59 AM EDT ----- Please let Mr. Larry Lin know that his blood work is good.  We will see him in June.

## 2019-06-22 ENCOUNTER — Ambulatory Visit: Payer: Medicare HMO | Admitting: Gastroenterology

## 2019-06-28 DIAGNOSIS — M5440 Lumbago with sciatica, unspecified side: Secondary | ICD-10-CM | POA: Diagnosis not present

## 2019-06-28 DIAGNOSIS — Z79899 Other long term (current) drug therapy: Secondary | ICD-10-CM | POA: Diagnosis not present

## 2019-06-28 DIAGNOSIS — G894 Chronic pain syndrome: Secondary | ICD-10-CM | POA: Diagnosis not present

## 2019-06-28 DIAGNOSIS — F1120 Opioid dependence, uncomplicated: Secondary | ICD-10-CM | POA: Diagnosis not present

## 2019-06-28 DIAGNOSIS — M542 Cervicalgia: Secondary | ICD-10-CM | POA: Diagnosis not present

## 2019-06-28 DIAGNOSIS — M5011 Cervical disc disorder with radiculopathy,  high cervical region: Secondary | ICD-10-CM | POA: Diagnosis not present

## 2019-06-28 DIAGNOSIS — M5001 Cervical disc disorder with myelopathy,  high cervical region: Secondary | ICD-10-CM | POA: Diagnosis not present

## 2019-06-28 DIAGNOSIS — M961 Postlaminectomy syndrome, not elsewhere classified: Secondary | ICD-10-CM | POA: Diagnosis not present

## 2019-07-01 ENCOUNTER — Ambulatory Visit: Payer: Self-pay | Admitting: Urology

## 2019-07-13 ENCOUNTER — Other Ambulatory Visit: Payer: Self-pay

## 2019-07-13 NOTE — Progress Notes (Addendum)
;   07/14/2019 4:44 PM   Larry Lin 03-04-1955 [id number]  Referring provider: [name], [place],  [place] [address]  Chief Complaint  Patient presents with  . Benign Prostatic Hypertrophy    follow up  . Hypogonadism    HPI: Patient is a 64 year old male with testosterone deficiency, ED, BPH with LU TS and nocturia who presents today for a follow up.    Testosterone deficiency He is having spontaneous erections at night when he takes the Cialis.  He does not have sleep apnea.   He is using AndroGel 1.62% 4 pumps daily and applying it to different locations Component     Latest Ref Rng & Units 05/26/2019  Testosterone     264 - 916 ng/dL 535   Erectile dysfunction SHIM score: 16    Previous SHIM score: 19  Main complaint: Achieving and maintaining erections x few years Risk factors: age, BPH and testosterone deficiency    No painful erections or curvatures with his erections.    Still having spontaneous erections Tried:  Cialis - effective     SHIM    Row Name 07/14/19 1047         SHIM: Over the last 6 months:   How do you rate your confidence that you could get and keep an erection? Moderate     When you had erections with sexual stimulation, how often were your erections hard enough for penetration (entering your partner)? Sometimes (about half the time)     During sexual intercourse, how often were you able to maintain your erection after you had penetrated (entered) your partner? Sometimes (about half the time)     During sexual intercourse, how difficult was it to maintain your erection to completion of intercourse? Difficult     When you attempted sexual intercourse, how often was it satisfactory for you? Most Times (much more than half the time)       SHIM Total Score   SHIM 16            Score: 1-7 Severe ED 8-11 Moderate ED 12-16 Mild-Moderate ED 17-21 Mild ED 22-25 No ED  BPH WITH LUTS  (prostate and/or  bladder) I PSS score: 12/3   PVR: 25 mL    Previous score: 13/4   Previous PVR: 0 mL   Major complaint(s):  Urgency/nocturia x 3.  He is taking the Gemtesa samples every 3 days and he finds them effective in controlling his urgency.  Patient denies any modifying or aggravating factors.  Patient denies any gross hematuria, dysuria or suprapubic/flank pain.  Patient denies any fevers, chills, nausea or vomiting.    IPSS    Row Name 07/14/19 1000         International Prostate Symptom Score   How often have you had the sensation of not emptying your bladder? Less than 1 in 5     How often have you had to urinate less than every two hours? About half the time     How often have you found you stopped and started again several times when you urinated? Less than half the time     How often have you found it difficult to postpone urination? About half the time     How often have you had a weak urinary stream? Less than 1 in 5 times     How often have you had to strain to start urination? Less than 1  in 5 times     How many times did you typically get up at night to urinate? 1 Time     Total IPSS Score 12       Quality of Life due to urinary symptoms   If you were to spend the rest of your life with your urinary condition just the way it is now how would you feel about that? Mixed           PMH: Past Medical History:  Diagnosis Date  . Arthritis   . Corticostriatal-spinal degeneration (HCC)   . GERD (gastroesophageal reflux disease)   . Low testosterone     Surgical History: Past Surgical History:  Procedure Laterality Date  . BACK SURGERY  2006  . COLONOSCOPY WITH PROPOFOL N/A 12/04/2018   Procedure: COLONOSCOPY WITH PROPOFOL;  Surgeon: Toney Reil, MD;  Location: [facility] ENDOSCOPY;  Service: Gastroenterology;  Laterality: N/A;  . diverticulitis surgery     removed some of lower intestine  . ESOPHAGOGASTRODUODENOSCOPY (EGD) WITH PROPOFOL N/A 12/04/2018   Procedure:  ESOPHAGOGASTRODUODENOSCOPY (EGD) WITH PROPOFOL;  Surgeon: Toney Reil, MD;  Location: [facility] ENDOSCOPY;  Service: Gastroenterology;  Laterality: N/A;  . NECK SURGERY  2015  . VASECTOMY     only right side done for nerve pain    Home Medications:  Allergies as of 07/14/2019   No Known Allergies     Medication List       Accurate as of July 14, 2019  4:44 PM. If you have any questions, ask your nurse or doctor.        STOP taking these medications   pantoprazole 20 MG tablet Commonly known as: PROTONIX Stopped by: Michiel Cowboy, PA-C     TAKE these medications   ALPRAZolam 0.25 MG tablet Commonly known as: XANAX Take 0.25 mg by mouth at bedtime as needed for anxiety.   ascorbic acid 100 MG tablet Commonly known as: VITAMIN C Take 1 tablet by mouth daily.   cyclobenzaprine 10 MG tablet Commonly known as: FLEXERIL   Gemtesa 75 MG Tabs Generic drug: Vibegron Take 1 tablet by mouth daily.   MENS 50+ MULTI VITAMIN/MIN PO Take 1 tablet by mouth daily.   morphine 30 MG 12 hr tablet Commonly known as: MS CONTIN Take 1 tablet by mouth 2 (two) times a day.   tadalafil 5 MG tablet Commonly known as: CIALIS Take 1 tablet (5 mg total) by mouth daily as needed for erectile dysfunction.   Testosterone 20.25 MG/ACT (1.62%) Gel       Allergies: No Known Allergies  Family History: Family History  Problem Relation Age of Onset  . Leukemia Mother   . Healthy Father   . Kidney disease Neg Hx   . Prostate cancer Neg Hx     Social History:  reports that he quit smoking about 32 years ago. He has never used smokeless tobacco. He reports previous alcohol use of about 1.0 standard drink of alcohol per week. He reports that he does not use drugs.  ROS: For pertinent review of systems please refer to history of present illness  Physical Exam: BP (!) 99/59   Pulse 74   Ht 6\' 2"  (1.88 m)   Wt 141 lb (64 kg)   BMI 18.10 kg/m   Constitutional:  Well nourished.  Alert and oriented, No acute distress. HEENT: [place] AT, mask in place.  Trachea midline Cardiovascular: No clubbing, cyanosis, or edema. Respiratory: Normal respiratory effort, no increased work of breathing.  GI: Abdomen is soft, non tender, non distended, no abdominal masses. Liver and spleen not palpable.  No hernias appreciated.  Stool sample for occult testing is not indicated.   GU: No CVA tenderness.  No bladder fullness or masses.  Patient with circumcised phallus.   Urethral meatus is patent.  No penile discharge. No penile lesions or rashes. Scrotum without lesions, cysts, rashes and/or edema.  Testicles are located scrotally bilaterally. They are atrophic.  No masses are appreciated in the testicles. Left and right epididymis are normal. Rectal: Patient with  normal sphincter tone. Anus and perineum without scarring or rashes. No rectal masses are appreciated. Prostate is approximately 45 grams, no nodules are appreciated. Seminal vesicles could not be palpated Skin: No rashes, bruises or suspicious lesions. Lymph: No inguinal adenopathy. Neurologic: Grossly intact, no focal deficits, moving all 4 extremities. Psychiatric: Normal mood and affect.   Laboratory Data: Lab Results  Component Value Date   WBC 5.8 11/03/2018   HGB 13.9 05/26/2019   HCT 40.8 05/26/2019   MCV 90 11/03/2018   PLT 202 11/03/2018    Lab Results  Component Value Date   CREATININE 0.77 11/12/2018    Lab Results  Component Value Date   TESTOSTERONE 535 05/26/2019    Lab Results  Component Value Date   HGBA1C 5.5 11/25/2014    Lab Results  Component Value Date   AST 14 09/01/2018   Lab Results  Component Value Date   ALT 10 09/01/2018    Component     Latest Ref Rng & Units 04/25/2015 03/26/2017 03/26/2017 09/04/2017         11:49 AM  4:39 PM   Prostate Specific Ag, Serum     0.0 - 4.0 ng/mL 0.2 CANCELED 0.2 0.1   Component     Latest Ref Rng & Units 05/26/2019          Prostate Specific Ag,  Serum     0.0 - 4.0 ng/mL 0.2      Urinalysis Component     Latest Ref Rng & Units 05/26/2019  Specific Gravity, UA     1.005 - 1.030 1.020  pH, UA     5.0 - 7.5 5.0  Color, UA     Yellow Yellow  Appearance Ur     Clear Clear  Leukocytes,UA     Negative Negative  Protein,UA     Negative/Trace Negative  Glucose, UA     Negative Negative  Ketones, UA     Negative Negative  RBC, UA     Negative Negative  Bilirubin, UA     Negative Negative  Urobilinogen, Ur     0.2 - 1.0 mg/dL 0.2  Nitrite, UA     Negative Negative  Microscopic Examination      See below:   Component     Latest Ref Rng & Units 05/26/2019          WBC, UA     0 - 5 /hpf 0-5  RBC     0 - 2 /hpf None seen  Epithelial Cells (non renal)     0 - 10 /hpf None seen  Bacteria, UA     None seen/Few None seen   I have reviewed the labs.   Pertinent Imaging Results for [facility], Larry L "Shon Hale (MRN [id number]) as of 07/14/2019 16:50  Ref. Range 07/14/2019 16:50  Scan Result Unknown 25 mL    Assessment & Plan:    1. Testosterone deficiency Testosterone now at  therapeutic levels Continue AndroGel 1.62%, 4 pumps daily Return to the clinic in 6 months for serum testosterone level, hemoglobin, hematocrit and PSA  2. Erectile dysfunction SHIM score is 16, it is worsening He is finding the Cialis 5 mg daily effective in achieving erections RTC in 6 months for repeat SHIM score and exam   3. BPH with LU TS I PSS 12/3 Continue conservative management, avoiding bladder irritants and timed voiding's Most bothersome symptom is urgency/nocturia - at goal with Gemtesa 75 mg every three days Completed PT Continue Cialis 5 mg daily  RTC in 6 months for I PSS, PVR, PSA and exam  Return in about 6 months (around 01/13/2020) for IPSS, SHIM and exam, PSA, testosterone, H & H.  These notes generated with voice recognition software. I apologize for typographical errors.  Michiel Cowboy, PA-C  [facility] [address] [place], [place] [address] [phone]

## 2019-07-14 ENCOUNTER — Encounter: Payer: Self-pay | Admitting: Gastroenterology

## 2019-07-14 ENCOUNTER — Ambulatory Visit: Payer: Medicare HMO | Admitting: Gastroenterology

## 2019-07-14 ENCOUNTER — Other Ambulatory Visit: Payer: Self-pay

## 2019-07-14 ENCOUNTER — Encounter: Payer: Self-pay | Admitting: Urology

## 2019-07-14 ENCOUNTER — Ambulatory Visit: Payer: Medicare HMO | Admitting: Family Medicine

## 2019-07-14 ENCOUNTER — Ambulatory Visit: Payer: Medicare HMO | Admitting: Urology

## 2019-07-14 VITALS — BP 99/59 | HR 74 | Ht 74.0 in | Wt 141.0 lb

## 2019-07-14 VITALS — BP 117/79 | HR 79 | Temp 98.2°F | Ht 74.0 in | Wt 143.1 lb

## 2019-07-14 DIAGNOSIS — R350 Frequency of micturition: Secondary | ICD-10-CM

## 2019-07-14 DIAGNOSIS — M19012 Primary osteoarthritis, left shoulder: Secondary | ICD-10-CM | POA: Diagnosis not present

## 2019-07-14 DIAGNOSIS — M4722 Other spondylosis with radiculopathy, cervical region: Secondary | ICD-10-CM | POA: Diagnosis not present

## 2019-07-14 DIAGNOSIS — E349 Endocrine disorder, unspecified: Secondary | ICD-10-CM | POA: Diagnosis not present

## 2019-07-14 DIAGNOSIS — N138 Other obstructive and reflux uropathy: Secondary | ICD-10-CM

## 2019-07-14 DIAGNOSIS — R634 Abnormal weight loss: Secondary | ICD-10-CM | POA: Diagnosis not present

## 2019-07-14 DIAGNOSIS — R198 Other specified symptoms and signs involving the digestive system and abdomen: Secondary | ICD-10-CM | POA: Diagnosis not present

## 2019-07-14 DIAGNOSIS — N401 Enlarged prostate with lower urinary tract symptoms: Secondary | ICD-10-CM

## 2019-07-14 DIAGNOSIS — M542 Cervicalgia: Secondary | ICD-10-CM | POA: Diagnosis not present

## 2019-07-14 DIAGNOSIS — N529 Male erectile dysfunction, unspecified: Secondary | ICD-10-CM | POA: Diagnosis not present

## 2019-07-14 DIAGNOSIS — M25561 Pain in right knee: Secondary | ICD-10-CM | POA: Diagnosis not present

## 2019-07-14 DIAGNOSIS — S76311A Strain of muscle, fascia and tendon of the posterior muscle group at thigh level, right thigh, initial encounter: Secondary | ICD-10-CM | POA: Diagnosis not present

## 2019-07-14 DIAGNOSIS — M4322 Fusion of spine, cervical region: Secondary | ICD-10-CM | POA: Diagnosis not present

## 2019-07-14 LAB — BLADDER SCAN AMB NON-IMAGING: Scan Result: 25

## 2019-07-14 NOTE — Progress Notes (Signed)
Time  Arlyss Repress, MD [address]  [address]  [place], [place] [address]  Main: [phone]  Fax: [phone]    Gastroenterology Consultation  Referring Provider:     Emi Belfast, FNP Primary Care Physician:  Emi Belfast, FNP Primary Gastroenterologist:  Dr. Arlyss Repress Reason for Consultation:     Unintentional weight loss, normocytic anemia        HPI:   Larry Lin is a 64 y.o. male referred by Dr. Leone Payor, Binnie Rail, FNP  for consultation & management of unintentional weight loss, normocytic anemia.  Patient reports that for the last 12 to 16 months, he has been losing weight by 1 pound a month associated with gradual loss of appetite.  He also reports upper abdominal tenderness, worse after eating.  For the last 2 months, he has been experiencing constipation.  He denies rectal bleeding, melena.  Labs by his PCP revealed mild normocytic anemia, hemoglobin 12.8.  CMP, TSH, HIV, hep C antibody unremarkable. He lives alone.  Patient was working as a Sport and exercise psychologist at [employer], quit job due to severe back pain and underwent back surgery.  He recently sold his house in [place] and currently staying in the boat in [place].  He continues his medical care in [place], by staying in a to be in be in town.  He is planning to visit his son in [place] today He denies history of heavy smoking, denies heavy alcohol use  Follow-up visit 07/11/2019 Patient is here for follow-up of weight loss, unable to gain. He was originally seen by me for weight loss and anemia.  At that time, he underwent CT abdomen and pelvis which was unremarkable.  Subsequently, underwent EGD and colonoscopy which were also unremarkable.  He lost about 6 pounds since last visit 10/2018.  He is still living on a boat alone.  He tries to consume at least 2000 cal/day.  He does report irregular bowel habits and describes his stool on Bristol stool scale as 1 or 2.  He reports decreased appetite.   He is no longer anemic, no evidence of iron deficiency  NSAIDs: None  Antiplts/Anticoagulants/Anti thrombotics: None  GI Procedures: Colonoscopy about 20 years ago and he had history of sigmoid diverticulitis and underwent surgery  EGD and colonoscopy 12/04/2018 - Normal esophagus. - Normal stomach. - Normal examined duodenum. DIAGNOSIS:  A. DUODENUM; COLD BIOPSY:  - ENTERIC MUCOSA WITH PRESERVED VILLOUS ARCHITECTURE AND NO SIGNIFICANT  HISTOPATHOLOGIC CHANGE.  - NEGATIVE FOR FEATURES OF CELIAC, DYSPLASIA, AND MALIGNANCY.   B. STOMACH, RANDOM; COLD BIOPSY:  - GASTRIC ANTRAL AND OXYNTIC MUCOSA WITH MILD PPI EFFECT.  - NEGATIVE FOR H. PYLORI, DYSPLASIA, AND MALIGNANCY.  - Diverticulosis in the sigmoid colon, in the transverse colon and in the ascending colon. - The distal rectum and anal verge are normal on retroflexion view. - No specimens collected.  Past Medical History:  Diagnosis Date   Arthritis    Corticostriatal-spinal degeneration (HCC)    GERD (gastroesophageal reflux disease)    Low testosterone     Past Surgical History:  Procedure Laterality Date   BACK SURGERY  2006   COLONOSCOPY WITH PROPOFOL N/A 12/04/2018   Procedure: COLONOSCOPY WITH PROPOFOL;  Surgeon: Toney Reil, MD;  Location: [facility] ENDOSCOPY;  Service: Gastroenterology;  Laterality: N/A;   diverticulitis surgery     removed some of lower intestine   ESOPHAGOGASTRODUODENOSCOPY (EGD) WITH PROPOFOL N/A 12/04/2018   Procedure: ESOPHAGOGASTRODUODENOSCOPY (EGD) WITH PROPOFOL;  Surgeon:  Toney Reil, MD;  Location: [facility] ENDOSCOPY;  Service: Gastroenterology;  Laterality: N/A;   NECK SURGERY  2015   VASECTOMY     only right side done for nerve pain    Current Outpatient Medications:    ALPRAZolam (XANAX) 0.25 MG tablet, Take 0.25 mg by mouth at bedtime as needed for anxiety., Disp: , Rfl:    ascorbic acid (VITAMIN C) 100 MG tablet, Take 1 tablet by mouth daily., Disp: , Rfl:     cyclobenzaprine (FLEXERIL) 10 MG tablet, , Disp: , Rfl:    morphine (MS CONTIN) 30 MG 12 hr tablet, Take 1 tablet by mouth 2 (two) times a day., Disp: , Rfl:    Multiple Vitamins-Minerals (MENS 50+ MULTI VITAMIN/MIN PO), Take 1 tablet by mouth daily., Disp: , Rfl:    tadalafil (CIALIS) 5 MG tablet, Take 1 tablet (5 mg total) by mouth daily as needed for erectile dysfunction., Disp: 90 tablet, Rfl: 0   Testosterone 20.25 MG/ACT (1.62%) GEL, , Disp: , Rfl:    Vibegron (GEMTESA) 75 MG TABS, Take 1 tablet by mouth daily., Disp: 90 tablet, Rfl: 0   Family History  Problem Relation Age of Onset   Leukemia Mother    Healthy Father    Kidney disease Neg Hx    Prostate cancer Neg Hx      Social History   Tobacco Use   Smoking status: Former Smoker    Quit date: 01/22/1987    Years since quitting: 32.4   Smokeless tobacco: Never Used  Vaping Use   Vaping Use: Never used  Substance Use Topics   Alcohol use: Not Currently    Alcohol/week: 1.0 standard drink    Types: 1 Cans of beer per week   Drug use: No    Allergies as of 07/14/2019   (No Known Allergies)    Review of Systems:    All systems reviewed and negative except where noted in HPI.   Physical Exam:  BP 117/79 (BP Location: Left Arm, Patient Position: Sitting, Cuff Size: Normal)    Pulse 79    Temp 98.2 F (36.8 C) (Oral)    Ht 6\' 2"  (1.88 m)    Wt 143 lb 2 oz (64.9 kg)    BMI 18.38 kg/m  No LMP for male patient.  General:   Alert, thin build, moderately nourished, pleasant and cooperative in NAD Head:  Normocephalic and atraumatic. Eyes:  Sclera clear, no icterus.   Conjunctiva pink. Ears:  Normal auditory acuity. Nose:  No deformity, discharge, or lesions. Mouth:  No deformity or lesions,oropharynx pink & moist. Neck:  Supple; no masses or thyromegaly. Lungs:  Respirations even and unlabored.  Clear throughout to auscultation.   No wheezes, crackles, or rhonchi. No acute distress. Heart:  Regular rate  and rhythm; no murmurs, clicks, rubs, or gallops. Abdomen:  Normal bowel sounds. Soft, nontender and non-distended without masses, hepatosplenomegaly or hernias noted.  No guarding or rebound tenderness.   Rectal: Not performed Msk:  Symmetrical without gross deformities. Good, equal movement & strength bilaterally. Pulses:  Normal pulses noted. Extremities:  No clubbing or edema.  No cyanosis. Neurologic:  Alert and oriented x3;  grossly normal neurologically. Skin:  Intact without significant lesions or rashes. No jaundice. Psych:  Alert and cooperative. Normal mood and affect.  Imaging Studies: Reviewed  Assessment and Plan:   OLIVE MOTYKA is a 64 y.o. tall Caucasian male with history of testosterone deficiency, BPH seen for normocytic anemia, unintentional weight  loss.  Work-up including CT abdomen and pelvis with contrast, upper endoscopy with biopsies and colonoscopy are unremarkable.  No evidence of anemia or iron deficiency.  Currently, with irregular bowel habits  Irregular bowel habits Discussed about high-fiber diet, MiraLAX 1-2 times daily Adequate intake of water  Unexplained weight loss will refer him to oncology for further evaluation   Follow up in 4 to 6 months   [name], MD

## 2019-07-15 ENCOUNTER — Telehealth: Payer: Self-pay

## 2019-07-15 DIAGNOSIS — R634 Abnormal weight loss: Secondary | ICD-10-CM

## 2019-07-15 NOTE — Telephone Encounter (Signed)
Did referral to oncology

## 2019-07-15 NOTE — Telephone Encounter (Signed)
-----   Message from Toney Reil, MD sent at 07/14/2019  5:34 PM EDT ----- Regarding: Oncology referral Please refer him to oncology for unexplained weight loss  RV

## 2019-07-28 DIAGNOSIS — M5440 Lumbago with sciatica, unspecified side: Secondary | ICD-10-CM | POA: Diagnosis not present

## 2019-07-28 DIAGNOSIS — F1120 Opioid dependence, uncomplicated: Secondary | ICD-10-CM | POA: Diagnosis not present

## 2019-07-28 DIAGNOSIS — M961 Postlaminectomy syndrome, not elsewhere classified: Secondary | ICD-10-CM | POA: Diagnosis not present

## 2019-07-28 DIAGNOSIS — Z79899 Other long term (current) drug therapy: Secondary | ICD-10-CM | POA: Diagnosis not present

## 2019-07-28 DIAGNOSIS — M5001 Cervical disc disorder with myelopathy,  high cervical region: Secondary | ICD-10-CM | POA: Diagnosis not present

## 2019-07-28 DIAGNOSIS — M542 Cervicalgia: Secondary | ICD-10-CM | POA: Diagnosis not present

## 2019-07-28 DIAGNOSIS — G894 Chronic pain syndrome: Secondary | ICD-10-CM | POA: Diagnosis not present

## 2019-07-28 DIAGNOSIS — M5011 Cervical disc disorder with radiculopathy,  high cervical region: Secondary | ICD-10-CM | POA: Diagnosis not present

## 2019-09-06 DIAGNOSIS — M5011 Cervical disc disorder with radiculopathy,  high cervical region: Secondary | ICD-10-CM | POA: Diagnosis not present

## 2019-09-06 DIAGNOSIS — G894 Chronic pain syndrome: Secondary | ICD-10-CM | POA: Diagnosis not present

## 2019-09-06 DIAGNOSIS — M5440 Lumbago with sciatica, unspecified side: Secondary | ICD-10-CM | POA: Diagnosis not present

## 2019-09-06 DIAGNOSIS — F1120 Opioid dependence, uncomplicated: Secondary | ICD-10-CM | POA: Diagnosis not present

## 2019-09-06 DIAGNOSIS — M542 Cervicalgia: Secondary | ICD-10-CM | POA: Diagnosis not present

## 2019-09-06 DIAGNOSIS — M961 Postlaminectomy syndrome, not elsewhere classified: Secondary | ICD-10-CM | POA: Diagnosis not present

## 2019-09-06 DIAGNOSIS — Z79899 Other long term (current) drug therapy: Secondary | ICD-10-CM | POA: Diagnosis not present

## 2019-09-06 DIAGNOSIS — M5001 Cervical disc disorder with myelopathy,  high cervical region: Secondary | ICD-10-CM | POA: Diagnosis not present

## 2019-09-08 ENCOUNTER — Other Ambulatory Visit: Payer: Self-pay | Admitting: Urology

## 2019-09-08 DIAGNOSIS — N529 Male erectile dysfunction, unspecified: Secondary | ICD-10-CM

## 2019-09-11 ENCOUNTER — Other Ambulatory Visit: Payer: Self-pay | Admitting: Urology

## 2019-09-11 DIAGNOSIS — N529 Male erectile dysfunction, unspecified: Secondary | ICD-10-CM

## 2019-09-21 ENCOUNTER — Other Ambulatory Visit: Payer: Self-pay | Admitting: Urology

## 2019-09-23 ENCOUNTER — Other Ambulatory Visit: Payer: Self-pay

## 2019-09-23 NOTE — Telephone Encounter (Signed)
Patient called asking for refill on Testosterone Gel.

## 2019-09-28 ENCOUNTER — Other Ambulatory Visit: Payer: Self-pay | Admitting: *Deleted

## 2019-09-28 DIAGNOSIS — N138 Other obstructive and reflux uropathy: Secondary | ICD-10-CM

## 2019-09-28 NOTE — Telephone Encounter (Signed)
Patient asking about testosterone gel .

## 2019-10-21 DIAGNOSIS — G894 Chronic pain syndrome: Secondary | ICD-10-CM | POA: Diagnosis not present

## 2019-10-21 DIAGNOSIS — Z79899 Other long term (current) drug therapy: Secondary | ICD-10-CM | POA: Diagnosis not present

## 2019-11-01 DIAGNOSIS — M542 Cervicalgia: Secondary | ICD-10-CM | POA: Diagnosis not present

## 2019-11-01 DIAGNOSIS — M5440 Lumbago with sciatica, unspecified side: Secondary | ICD-10-CM | POA: Diagnosis not present

## 2019-11-01 DIAGNOSIS — G894 Chronic pain syndrome: Secondary | ICD-10-CM | POA: Diagnosis not present

## 2019-11-01 DIAGNOSIS — F1120 Opioid dependence, uncomplicated: Secondary | ICD-10-CM | POA: Diagnosis not present

## 2019-11-01 DIAGNOSIS — M5011 Cervical disc disorder with radiculopathy,  high cervical region: Secondary | ICD-10-CM | POA: Diagnosis not present

## 2019-11-01 DIAGNOSIS — M5001 Cervical disc disorder with myelopathy,  high cervical region: Secondary | ICD-10-CM | POA: Diagnosis not present

## 2019-11-01 DIAGNOSIS — M961 Postlaminectomy syndrome, not elsewhere classified: Secondary | ICD-10-CM | POA: Diagnosis not present

## 2019-11-10 ENCOUNTER — Ambulatory Visit: Payer: Medicare HMO | Admitting: Family Medicine

## 2019-11-10 ENCOUNTER — Ambulatory Visit (INDEPENDENT_AMBULATORY_CARE_PROVIDER_SITE_OTHER): Payer: Medicare HMO | Admitting: Family Medicine

## 2019-11-10 ENCOUNTER — Other Ambulatory Visit: Payer: Self-pay

## 2019-11-10 ENCOUNTER — Encounter: Payer: Self-pay | Admitting: Family Medicine

## 2019-11-10 VITALS — BP 100/64 | HR 72 | Temp 98.2°F | Wt 143.2 lb

## 2019-11-10 DIAGNOSIS — M549 Dorsalgia, unspecified: Secondary | ICD-10-CM | POA: Diagnosis not present

## 2019-11-10 DIAGNOSIS — E349 Endocrine disorder, unspecified: Secondary | ICD-10-CM

## 2019-11-10 DIAGNOSIS — M7061 Trochanteric bursitis, right hip: Secondary | ICD-10-CM | POA: Diagnosis not present

## 2019-11-10 DIAGNOSIS — R634 Abnormal weight loss: Secondary | ICD-10-CM

## 2019-11-10 DIAGNOSIS — E7800 Pure hypercholesterolemia, unspecified: Secondary | ICD-10-CM

## 2019-11-10 DIAGNOSIS — L719 Rosacea, unspecified: Secondary | ICD-10-CM

## 2019-11-10 DIAGNOSIS — M19012 Primary osteoarthritis, left shoulder: Secondary | ICD-10-CM | POA: Diagnosis not present

## 2019-11-10 DIAGNOSIS — G8929 Other chronic pain: Secondary | ICD-10-CM

## 2019-11-10 DIAGNOSIS — M542 Cervicalgia: Secondary | ICD-10-CM

## 2019-11-10 LAB — CBC WITH DIFFERENTIAL/PLATELET
Basophils Absolute: 0 10*3/uL (ref 0.0–0.1)
Basophils Relative: 0.3 % (ref 0.0–3.0)
Eosinophils Absolute: 0 10*3/uL (ref 0.0–0.7)
Eosinophils Relative: 0.2 % (ref 0.0–5.0)
HCT: 41.2 % (ref 39.0–52.0)
Hemoglobin: 14 g/dL (ref 13.0–17.0)
Lymphocytes Relative: 14.9 % (ref 12.0–46.0)
Lymphs Abs: 1.1 10*3/uL (ref 0.7–4.0)
MCHC: 34 g/dL (ref 30.0–36.0)
MCV: 87.8 fl (ref 78.0–100.0)
Monocytes Absolute: 0.4 10*3/uL (ref 0.1–1.0)
Monocytes Relative: 5.7 % (ref 3.0–12.0)
Neutro Abs: 5.9 10*3/uL (ref 1.4–7.7)
Neutrophils Relative %: 78.9 % — ABNORMAL HIGH (ref 43.0–77.0)
Platelets: 213 10*3/uL (ref 150.0–400.0)
RBC: 4.69 Mil/uL (ref 4.22–5.81)
RDW: 12.7 % (ref 11.5–15.5)
WBC: 7.5 10*3/uL (ref 4.0–10.5)

## 2019-11-10 LAB — LIPID PANEL
Cholesterol: 196 mg/dL (ref 0–200)
HDL: 55.3 mg/dL (ref >39.00)
LDL Cholesterol: 126 mg/dL — ABNORMAL HIGH (ref 0–99)
NonHDL: 140.57
Total CHOL/HDL Ratio: 4
Triglycerides: 71 mg/dL (ref 0.0–149.0)
VLDL: 14.2 mg/dL (ref 0.0–40.0)

## 2019-11-10 LAB — TSH: TSH: 0.76 u[IU]/mL (ref 0.35–4.50)

## 2019-11-10 MED ORDER — METRONIDAZOLE 1 % EX GEL: Freq: Every day | CUTANEOUS | 1 refills | Status: AC

## 2019-11-10 NOTE — Patient Instructions (Signed)
Good to see you today  You will get a call about a hematology appointment  Moisturizer- Vanicream, over the counter, try to use once a day for a couple of weeks, then go to twice a day

## 2019-11-10 NOTE — Progress Notes (Signed)
Subjective:    Patient ID: Larry Lin, male    DOB: [birth year], 64 y.o.   MRN: [id number]  HPI Chief Complaint  Patient presents with  . Follow-up    anemia   He continues to live on his boat in [place] part of the time.   Chronic pain- continues to follow up with Dr. Welton Flakes, monthly video visits.  Was able to do a little walking, rides his electric bike, a little stretching, unable to do much of the gym due to his shoulders.  Chronic weight loss- has been fully worked up by GI. Recommended hem/onc, referral was placed, they were not able to contact him.  He continues to be concerned about his weight loss and is open to heme-onc follow-up  Bilateral shoulder pain- needs both shoulders replaced. Has been getting injections periodically which holds him for about 3 months.  Low testosterone-followed by urology.  Had PSA earlier this year which was normal.  Was normal.  Doing well on topical replacement.  Feels like his weight loss started when insurance no longer covered his testosterone supplementation and he went from using 4 times a day to stopping.  He feels like some of his muscle mass is coming back.  Rosacea-long history, has been using his sisters Taclonex with improvement of symptoms.  Uses mild soap, unable to use moisturizers because they seem to break him out. Review of Systems Denies headaches, chest pain, shortness of breath, overall abdominal pain is improved, chronic constipation and takes laxatives as needed, no leg swelling    Objective:   Physical Exam Vitals reviewed.  Constitutional:      General: He is not in acute distress.    Appearance: He is not ill-appearing, toxic-appearing or diaphoretic.     Comments: Thin.   HENT:     Head: Normocephalic and atraumatic.  Eyes:     Conjunctiva/sclera: Conjunctivae normal.  Cardiovascular:     Rate and Rhythm: Normal rate and regular rhythm.     Heart sounds: Normal heart sounds.  Pulmonary:     Effort:  Pulmonary effort is normal.     Breath sounds: Normal breath sounds.  Musculoskeletal:     Cervical back: Normal range of motion and neck supple.     Right lower leg: No edema.     Left lower leg: No edema.  Skin:    General: Skin is warm and dry.     Comments: Marked erythema of cheeks.   Neurological:     Mental Status: He is alert and oriented to person, place, and time.  Psychiatric:        Mood and Affect: Mood normal.        Behavior: Behavior normal.        Thought Content: Thought content normal.        Judgment: Judgment normal.       BP 100/64   Pulse 72   Temp 98.2 F (36.8 C)   Wt 143 lb 4 oz (65 kg)   SpO2 95%   BMI 18.39 kg/m  Wt Readings from Last 3 Encounters:  11/10/19 143 lb 4 oz (65 kg)  07/14/19 143 lb 2 oz (64.9 kg)  07/14/19 141 lb (64 kg)       Assessment & Plan:  1. Rosacea -Taclonex does not seem to be covered by his insurance, will try metronidazole and he will let me know if he does not get improvement within 2 weeks -Advised on cleansing and moisturizing -  metroNIDAZOLE (METROGEL) 1 % gel; Apply topically daily.  Dispense: 45 g; Refill: 1  2. Abnormal weight loss -Seems to be stable, will go ahead and resubmit referral to heme-onc as was recommended by GI - CBC with Differential - TSH - Ambulatory referral to Oncology  3. Elevated LDL cholesterol level - Lipid Panel  4. Testosterone deficiency -He will continue to follow with urology  5. Chronic neck and back pain -Continue follow-up with Dr. Welton Flakes  This visit occurred during the SARS-CoV-2 public health emergency.  Safety protocols were in place, including screening questions prior to the visit, additional usage of staff PPE, and extensive cleaning of exam room while observing appropriate contact time as indicated for disinfecting solutions.      Olean Ree, FNP-BC  [facility], [facility]  11/10/2019 1:53 PM

## 2019-11-22 ENCOUNTER — Encounter: Payer: Medicare HMO | Admitting: Oncology

## 2019-11-22 ENCOUNTER — Other Ambulatory Visit: Payer: Medicare HMO

## 2019-12-01 ENCOUNTER — Inpatient Hospital Stay: Payer: Medicare HMO

## 2019-12-01 ENCOUNTER — Encounter: Payer: Medicare HMO | Admitting: Oncology

## 2019-12-09 ENCOUNTER — Encounter: Payer: Self-pay | Admitting: *Deleted

## 2019-12-09 ENCOUNTER — Telehealth: Payer: Self-pay | Admitting: *Deleted

## 2019-12-09 NOTE — Telephone Encounter (Signed)
Status: Approved, Coverage Starts on: 12/09/2019 12:00:00 AM, Coverage Ends on: 01/20/2021 12:00:00 AM  Sent my chart message , phone not working

## 2019-12-24 DIAGNOSIS — Z79891 Long term (current) use of opiate analgesic: Secondary | ICD-10-CM | POA: Diagnosis not present

## 2019-12-24 DIAGNOSIS — F1120 Opioid dependence, uncomplicated: Secondary | ICD-10-CM | POA: Diagnosis not present

## 2019-12-27 DIAGNOSIS — G894 Chronic pain syndrome: Secondary | ICD-10-CM | POA: Diagnosis not present

## 2019-12-27 DIAGNOSIS — M961 Postlaminectomy syndrome, not elsewhere classified: Secondary | ICD-10-CM | POA: Diagnosis not present

## 2019-12-27 DIAGNOSIS — F1120 Opioid dependence, uncomplicated: Secondary | ICD-10-CM | POA: Diagnosis not present

## 2019-12-27 DIAGNOSIS — M5440 Lumbago with sciatica, unspecified side: Secondary | ICD-10-CM | POA: Diagnosis not present

## 2019-12-27 DIAGNOSIS — M5011 Cervical disc disorder with radiculopathy,  high cervical region: Secondary | ICD-10-CM | POA: Diagnosis not present

## 2019-12-27 DIAGNOSIS — M542 Cervicalgia: Secondary | ICD-10-CM | POA: Diagnosis not present

## 2019-12-27 DIAGNOSIS — M5001 Cervical disc disorder with myelopathy,  high cervical region: Secondary | ICD-10-CM | POA: Diagnosis not present

## 2020-01-01 NOTE — Progress Notes (Signed)
[facility]  [phone] Fax:(336) [phone]  ID: Larry Lin OB: [birth year]  MR#: [id number]  DXI#:[id number]  Patient Care Team: Emi Belfast, FNP as PCP - General (Nurse Practitioner)  CHIEF COMPLAINT: Weight loss, headaches.  INTERVAL HISTORY: Patient is a 64 year old male who complains of approximately 30 pound unintentional weight loss over the past 12 to 18 months.  He reports a fair appetite and his weight has stabilized over the past several weeks.  He otherwise feels well.  He he has new onset headaches, but no other neurologic complaints.  He denies any recent fevers or illnesses.  He has no chest pain, shortness of breath, cough, or hemoptysis.  He denies any nausea, vomiting, constipation, or diarrhea.  He has no urinary complaints.  Patient otherwise feels well and offers no further specific complaints today.  REVIEW OF SYSTEMS:   Review of Systems  Constitutional: Positive for weight loss. Negative for fever and malaise/fatigue.  Respiratory: Negative.  Negative for cough, hemoptysis and shortness of breath.   Cardiovascular: Negative.  Negative for chest pain and leg swelling.  Gastrointestinal: Negative.  Negative for abdominal pain.  Genitourinary: Negative.  Negative for dysuria.  Musculoskeletal: Negative.  Negative for back pain.  Skin: Negative.  Negative for rash.  Neurological: Positive for headaches. Negative for dizziness, focal weakness and weakness.  Psychiatric/Behavioral: Negative.  The patient is not nervous/anxious.     As per HPI. Otherwise, a complete review of systems is negative.  PAST MEDICAL HISTORY: Past Medical History:  Diagnosis Date  . Anemia   . Arthritis   . Corticostriatal-spinal degeneration (HCC)   . GERD (gastroesophageal reflux disease)   . Low testosterone     PAST SURGICAL HISTORY: Past Surgical History:  Procedure Laterality Date  . BACK SURGERY  2006  . COLONOSCOPY WITH PROPOFOL  N/A 12/04/2018   Procedure: COLONOSCOPY WITH PROPOFOL;  Surgeon: Toney Reil, MD;  Location: [facility] ENDOSCOPY;  Service: Gastroenterology;  Laterality: N/A;  . diverticulitis surgery     removed some of lower intestine  . ESOPHAGOGASTRODUODENOSCOPY (EGD) WITH PROPOFOL N/A 12/04/2018   Procedure: ESOPHAGOGASTRODUODENOSCOPY (EGD) WITH PROPOFOL;  Surgeon: Toney Reil, MD;  Location: [facility] ENDOSCOPY;  Service: Gastroenterology;  Laterality: N/A;  . NECK SURGERY  2015  . VASECTOMY     only right side done for nerve pain    FAMILY HISTORY: Family History  Problem Relation Age of Onset  . Leukemia Mother   . Healthy Father   . Kidney disease Neg Hx   . Prostate cancer Neg Hx     ADVANCED DIRECTIVES (Y/N):  N  HEALTH MAINTENANCE: Social History   Tobacco Use  . Smoking status: Former Smoker    Quit date: 01/22/1987    Years since quitting: 32.9  . Smokeless tobacco: Never Used  Vaping Use  . Vaping Use: Never used  Substance Use Topics  . Alcohol use: Not Currently    Alcohol/week: 1.0 standard drink    Types: 1 Cans of beer per week  . Drug use: No     Colonoscopy:  PAP:  Bone density:  Lipid panel:  No Known Allergies  Current Outpatient Medications  Medication Sig Dispense Refill  . ALPRAZolam (XANAX) 0.25 MG tablet Take 0.25 mg by mouth at bedtime as needed for anxiety.    Marland Kitchen ascorbic acid (VITAMIN C) 100 MG tablet Take 1 tablet by mouth daily.    . Baclofen 5 MG TABS     . morphine (MS  CONTIN) 30 MG 12 hr tablet Take 1 tablet by mouth 2 (two) times a day.    . Multiple Vitamins-Minerals (MENS 50+ MULTI VITAMIN/MIN PO) Take 1 tablet by mouth daily.    . tadalafil (CIALIS) 5 MG tablet TAKE ONE TABLET BY MOUTH DAILY AS NEEDED FOR ERECTILE DYSFUNCTION 90 tablet 0  . Testosterone 20.25 MG/ACT (1.62%) GEL APPLY 4 PUMPS (81 G) TO INTACT SKIN DAILY 300 g 1  . Vibegron (GEMTESA) 75 MG TABS Take 1 tablet by mouth daily. 90 tablet 0  . VITAMIN D PO Take by mouth as  needed. Twice a week    . cyclobenzaprine (FLEXERIL) 10 MG tablet  (Patient not taking: No sig reported)    . MAGNESIUM PO Take by mouth. (Patient not taking: Reported on 01/05/2020)    . metroNIDAZOLE (METROGEL) 1 % gel Apply topically daily. (Patient not taking: Reported on 01/05/2020) 45 g 1   No current facility-administered medications for this visit.    OBJECTIVE: Vitals:   01/05/20 1114  BP: 117/76  Pulse: 78  Resp: 18  Temp: 99.2 F (37.3 C)  SpO2: 100%     Body mass index is 18.1 kg/m.    ECOG FS:0 - Asymptomatic  General: Well-developed, well-nourished, no acute distress. Eyes: Pink conjunctiva, anicteric sclera. HEENT: Normocephalic, moist mucous membranes. Lungs: No audible wheezing or coughing. Heart: Regular rate and rhythm. Abdomen: Soft, nontender, no obvious distention. Musculoskeletal: No edema, cyanosis, or clubbing. Neuro: Alert, answering all questions appropriately. Cranial nerves grossly intact. Skin: No rashes or petechiae noted. Psych: Normal affect. Lymphatics: No cervical, calvicular, axillary or inguinal LAD.   LAB RESULTS:  Lab Results  Component Value Date   NA 136 01/05/2020   K 4.3 01/05/2020   CL 100 01/05/2020   CO2 27 01/05/2020   GLUCOSE 99 01/05/2020   BUN 27 (H) 01/05/2020   CREATININE 0.72 01/05/2020   CALCIUM 9.5 01/05/2020   PROT 8.2 (H) 01/05/2020   ALBUMIN 4.7 01/05/2020   AST 17 01/05/2020   ALT 14 01/05/2020   ALKPHOS 39 01/05/2020   BILITOT 0.8 01/05/2020   GFRNONAA >60 01/05/2020   GFRAA >60 11/12/2018    Lab Results  Component Value Date   WBC 7.7 01/05/2020   NEUTROABS 5.5 01/05/2020   HGB 13.9 01/05/2020   HCT 41.6 01/05/2020   MCV 88.1 01/05/2020   PLT 208 01/05/2020     STUDIES: No results found.  ASSESSMENT: Weight loss, headaches.  PLAN:    1. Weight loss: Unclear etiology, the patient reports that is mildly improved.  CBC, metabolic panel, PSA, TSH and thyroid panel, B12, folate are all  within normal limits.  Colonoscopy and EGD in June 2021 did not reveal any distinct pathology.  Will get CT scan of the chest, abdomen, and pelvis for completeness.  Patient [place] in [place], therefore will return to clinic in January and have his imaging and follow-up visit on the same day. 2.  Headaches: Appears to be migraine, but will get CT of the head along with his body CT for completeness.  I spent a total of 45 minutes reviewing chart data, face-to-face evaluation with the patient, counseling and coordination of care as detailed above.  Patient expressed understanding and was in agreement with this plan. He also understands that He can call clinic at any time with any questions, concerns, or complaints.   Cancer Staging No matching staging information was found for the patient.  Jeralyn Ruths, MD  01/06/2020 6:59 AM

## 2020-01-05 ENCOUNTER — Inpatient Hospital Stay: Payer: Medicare HMO

## 2020-01-05 ENCOUNTER — Other Ambulatory Visit: Payer: Self-pay

## 2020-01-05 ENCOUNTER — Other Ambulatory Visit: Payer: Medicare HMO

## 2020-01-05 ENCOUNTER — Encounter: Payer: Self-pay | Admitting: Oncology

## 2020-01-05 ENCOUNTER — Inpatient Hospital Stay: Payer: Medicare HMO | Attending: Oncology | Admitting: Oncology

## 2020-01-05 ENCOUNTER — Other Ambulatory Visit: Payer: Self-pay | Admitting: *Deleted

## 2020-01-05 VITALS — BP 117/76 | HR 78 | Temp 99.2°F | Resp 18 | Ht 74.0 in | Wt 141.0 lb

## 2020-01-05 DIAGNOSIS — M129 Arthropathy, unspecified: Secondary | ICD-10-CM | POA: Insufficient documentation

## 2020-01-05 DIAGNOSIS — R634 Abnormal weight loss: Secondary | ICD-10-CM | POA: Insufficient documentation

## 2020-01-05 DIAGNOSIS — Z79899 Other long term (current) drug therapy: Secondary | ICD-10-CM | POA: Diagnosis not present

## 2020-01-05 DIAGNOSIS — N138 Other obstructive and reflux uropathy: Secondary | ICD-10-CM | POA: Diagnosis not present

## 2020-01-05 DIAGNOSIS — D649 Anemia, unspecified: Secondary | ICD-10-CM | POA: Diagnosis not present

## 2020-01-05 DIAGNOSIS — N401 Enlarged prostate with lower urinary tract symptoms: Secondary | ICD-10-CM

## 2020-01-05 DIAGNOSIS — R519 Headache, unspecified: Secondary | ICD-10-CM

## 2020-01-05 DIAGNOSIS — E349 Endocrine disorder, unspecified: Secondary | ICD-10-CM

## 2020-01-05 DIAGNOSIS — Z87891 Personal history of nicotine dependence: Secondary | ICD-10-CM | POA: Insufficient documentation

## 2020-01-05 DIAGNOSIS — K219 Gastro-esophageal reflux disease without esophagitis: Secondary | ICD-10-CM | POA: Diagnosis not present

## 2020-01-05 DIAGNOSIS — R51 Headache with orthostatic component, not elsewhere classified: Secondary | ICD-10-CM | POA: Insufficient documentation

## 2020-01-05 LAB — COMPREHENSIVE METABOLIC PANEL
ALT: 14 U/L (ref 0–44)
AST: 17 U/L (ref 15–41)
Albumin: 4.7 g/dL (ref 3.5–5.0)
Alkaline Phosphatase: 39 U/L (ref 38–126)
Anion gap: 9 (ref 5–15)
BUN: 27 mg/dL — ABNORMAL HIGH (ref 8–23)
CO2: 27 mmol/L (ref 22–32)
Calcium: 9.5 mg/dL (ref 8.9–10.3)
Chloride: 100 mmol/L (ref 98–111)
Creatinine, Ser: 0.72 mg/dL (ref 0.61–1.24)
GFR, Estimated: >60 mL/min (ref >60)
Glucose, Bld: 99 mg/dL (ref 70–99)
Potassium: 4.3 mmol/L (ref 3.5–5.1)
Sodium: 136 mmol/L (ref 135–145)
Total Bilirubin: 0.8 mg/dL (ref 0.3–1.2)
Total Protein: 8.2 g/dL — ABNORMAL HIGH (ref 6.5–8.1)

## 2020-01-05 LAB — CBC WITH DIFFERENTIAL/PLATELET
Abs Immature Granulocytes: 0.05 10*3/uL (ref 0.00–0.07)
Basophils Absolute: 0 10*3/uL (ref 0.0–0.1)
Basophils Relative: 0 %
Eosinophils Absolute: 0 10*3/uL (ref 0.0–0.5)
Eosinophils Relative: 0 %
HCT: 39.9 % (ref 39.0–52.0)
Hemoglobin: 13.6 g/dL (ref 13.0–17.0)
Immature Granulocytes: 1 %
Lymphocytes Relative: 20 %
Lymphs Abs: 1.5 10*3/uL (ref 0.7–4.0)
MCH: 30 pg (ref 26.0–34.0)
MCHC: 34.1 g/dL (ref 30.0–36.0)
MCV: 88.1 fL (ref 80.0–100.0)
Monocytes Absolute: 0.5 10*3/uL (ref 0.1–1.0)
Monocytes Relative: 7 %
Neutro Abs: 5.5 10*3/uL (ref 1.7–7.7)
Neutrophils Relative %: 72 %
Platelets: 208 10*3/uL (ref 150–400)
RBC: 4.53 MIL/uL (ref 4.22–5.81)
RDW: 13.3 % (ref 11.5–15.5)
WBC: 7.7 10*3/uL (ref 4.0–10.5)
nRBC: 0 % (ref 0.0–0.2)

## 2020-01-05 LAB — VITAMIN B12: Vitamin B-12: 424 pg/mL (ref 180–914)

## 2020-01-05 LAB — FOLATE: Folate: 15.4 ng/mL (ref >5.9)

## 2020-01-05 LAB — LACTATE DEHYDROGENASE: LDH: 147 U/L (ref 98–192)

## 2020-01-06 LAB — HEMOGLOBIN AND HEMATOCRIT, BLOOD
Hematocrit: 41.6 % (ref 37.5–51.0)
Hemoglobin: 13.9 g/dL (ref 13.0–17.7)

## 2020-01-06 LAB — THYROID PANEL WITH TSH
Free Thyroxine Index: 2.4 (ref 1.2–4.9)
T3 Uptake Ratio: 26 % (ref 24–39)
T4, Total: 9.3 ug/dL (ref 4.5–12.0)
TSH: 1.1 u[IU]/mL (ref 0.450–4.500)

## 2020-01-06 LAB — TESTOSTERONE: Testosterone: 595 ng/dL (ref 264–916)

## 2020-01-06 LAB — PSA: Prostate Specific Ag, Serum: 0.2 ng/mL (ref 0.0–4.0)

## 2020-01-07 ENCOUNTER — Other Ambulatory Visit: Payer: Self-pay

## 2020-01-12 ENCOUNTER — Ambulatory Visit: Payer: Self-pay | Admitting: Urology

## 2020-01-27 ENCOUNTER — Ambulatory Visit: Payer: Self-pay | Admitting: Urology

## 2020-01-27 ENCOUNTER — Ambulatory Visit: Payer: Medicare HMO

## 2020-01-27 ENCOUNTER — Ambulatory Visit: Payer: Medicare HMO | Admitting: Oncology

## 2020-02-16 ENCOUNTER — Other Ambulatory Visit: Payer: Self-pay | Admitting: Urology

## 2020-02-17 DIAGNOSIS — Z79891 Long term (current) use of opiate analgesic: Secondary | ICD-10-CM | POA: Diagnosis not present

## 2020-02-17 DIAGNOSIS — F1120 Opioid dependence, uncomplicated: Secondary | ICD-10-CM | POA: Diagnosis not present

## 2020-02-22 ENCOUNTER — Other Ambulatory Visit: Payer: Medicare HMO

## 2020-02-28 DIAGNOSIS — M5001 Cervical disc disorder with myelopathy,  high cervical region: Secondary | ICD-10-CM | POA: Diagnosis not present

## 2020-02-28 DIAGNOSIS — M5440 Lumbago with sciatica, unspecified side: Secondary | ICD-10-CM | POA: Diagnosis not present

## 2020-02-28 DIAGNOSIS — M542 Cervicalgia: Secondary | ICD-10-CM | POA: Diagnosis not present

## 2020-02-28 DIAGNOSIS — M5011 Cervical disc disorder with radiculopathy,  high cervical region: Secondary | ICD-10-CM | POA: Diagnosis not present

## 2020-02-28 DIAGNOSIS — M961 Postlaminectomy syndrome, not elsewhere classified: Secondary | ICD-10-CM | POA: Diagnosis not present

## 2020-02-28 DIAGNOSIS — G894 Chronic pain syndrome: Secondary | ICD-10-CM | POA: Diagnosis not present

## 2020-02-28 DIAGNOSIS — F1120 Opioid dependence, uncomplicated: Secondary | ICD-10-CM | POA: Diagnosis not present

## 2020-02-28 NOTE — Progress Notes (Signed)
[facility]  Telephone:(336) [phone] Fax:(336) [phone]  ID: [name] OB: 1955-08-05  MR#: [id number]  BPZ#:[id number]  Patient Care Team: Emi Belfast, FNP (Inactive) as PCP - General (Nurse Practitioner)  CHIEF COMPLAINT: Weight loss, headaches.  INTERVAL HISTORY: Patient returns to clinic today for further evaluation and discussion of his imaging results. His appetite has improved and he has gained 1 to 2 pounds recently. He currently feels well and is asymptomatic. He does not complain of headache today. He has no neurologic complaints. He denies any recent fevers or illnesses.  He has no chest pain, shortness of breath, cough, or hemoptysis.  He denies any nausea, vomiting, constipation, or diarrhea.  He has no urinary complaints. Patient offers no specific complaints today.  REVIEW OF SYSTEMS:   Review of Systems  Constitutional: Negative.  Negative for fever, malaise/fatigue and weight loss.  Respiratory: Negative.  Negative for cough, hemoptysis and shortness of breath.   Cardiovascular: Negative.  Negative for chest pain and leg swelling.  Gastrointestinal: Negative.  Negative for abdominal pain.  Genitourinary: Negative.  Negative for dysuria.  Musculoskeletal: Negative.  Negative for back pain.  Skin: Negative.  Negative for rash.  Neurological: Negative.  Negative for dizziness, focal weakness, weakness and headaches.  Psychiatric/Behavioral: Negative.  The patient is not nervous/anxious.     As per HPI. Otherwise, a complete review of systems is negative.  PAST MEDICAL HISTORY: Past Medical History:  Diagnosis Date  . Anemia   . Arthritis   . Corticostriatal-spinal degeneration (HCC)   . GERD (gastroesophageal reflux disease)   . Low testosterone     PAST SURGICAL HISTORY: Past Surgical History:  Procedure Laterality Date  . BACK SURGERY  2006  . COLONOSCOPY WITH PROPOFOL N/A 12/04/2018   Procedure: COLONOSCOPY WITH PROPOFOL;   Surgeon: Toney Reil, MD;  Location: [facility] ENDOSCOPY;  Service: Gastroenterology;  Laterality: N/A;  . diverticulitis surgery     removed some of lower intestine  . ESOPHAGOGASTRODUODENOSCOPY (EGD) WITH PROPOFOL N/A 12/04/2018   Procedure: ESOPHAGOGASTRODUODENOSCOPY (EGD) WITH PROPOFOL;  Surgeon: Toney Reil, MD;  Location: [facility] ENDOSCOPY;  Service: Gastroenterology;  Laterality: N/A;  . NECK SURGERY  2015  . VASECTOMY     only right side done for nerve pain    FAMILY HISTORY: Family History  Problem Relation Age of Onset  . Leukemia Mother   . Healthy Father   . Kidney disease Neg Hx   . Prostate cancer Neg Hx     ADVANCED DIRECTIVES (Y/N):  N  HEALTH MAINTENANCE: Social History   Tobacco Use  . Smoking status: Former Smoker    Quit date: 01/22/1987    Years since quitting: 33.1  . Smokeless tobacco: Never Used  Vaping Use  . Vaping Use: Never used  Substance Use Topics  . Alcohol use: Not Currently    Alcohol/week: 1.0 standard drink    Types: 1 Cans of beer per week  . Drug use: No     Colonoscopy:  PAP:  Bone density:  Lipid panel:  No Known Allergies  Current Outpatient Medications  Medication Sig Dispense Refill  . ALPRAZolam (XANAX) 0.25 MG tablet Take 0.25 mg by mouth at bedtime as needed for anxiety.    Marland Kitchen ascorbic acid (VITAMIN C) 100 MG tablet Take 1 tablet by mouth daily.    . Baclofen 5 MG TABS     . metroNIDAZOLE (METROGEL) 1 % gel Apply topically daily. 45 g 1  . morphine (MS CONTIN)  30 MG 12 hr tablet Take 1 tablet by mouth 2 (two) times a day.    . Multiple Vitamins-Minerals (MENS 50+ MULTI VITAMIN/MIN PO) Take 1 tablet by mouth daily.    . tadalafil (CIALIS) 10 MG tablet Take 1 tablet (10 mg total) by mouth daily as needed for erectile dysfunction. 90 tablet 3  . Testosterone 1.62 % GEL APPLY FOUR PUMPS BY TOPICAL ROUTE TO INTACT SKIN DAILY 300 g 0  . Vibegron (GEMTESA) 75 MG TABS Take 1 tablet by mouth daily. 90 tablet 0  .  VITAMIN D PO Take by mouth as needed. Twice a week     No current facility-administered medications for this visit.    OBJECTIVE: Vitals:   02/29/20 1310  BP: 107/77  Pulse: 76  Resp: 17  Temp: 99 F (37.2 C)  SpO2: 100%     Body mass index is 18.57 kg/m.    ECOG FS:0 - Asymptomatic  General: Well-developed, well-nourished, no acute distress. Eyes: Pink conjunctiva, anicteric sclera. HEENT: Normocephalic, moist mucous membranes. Lungs: No audible wheezing or coughing. Heart: Regular rate and rhythm. Abdomen: Soft, nontender, no obvious distention. Musculoskeletal: No edema, cyanosis, or clubbing. Neuro: Alert, answering all questions appropriately. Cranial nerves grossly intact. Skin: No rashes or petechiae noted. Psych: Normal affect.  LAB RESULTS:  Lab Results  Component Value Date   NA 136 01/05/2020   K 4.3 01/05/2020   CL 100 01/05/2020   CO2 27 01/05/2020   GLUCOSE 99 01/05/2020   BUN 27 (H) 01/05/2020   CREATININE 0.90 02/29/2020   CALCIUM 9.5 01/05/2020   PROT 8.2 (H) 01/05/2020   ALBUMIN 4.7 01/05/2020   AST 17 01/05/2020   ALT 14 01/05/2020   ALKPHOS 39 01/05/2020   BILITOT 0.8 01/05/2020   GFRNONAA >60 01/05/2020   GFRAA >60 11/12/2018    Lab Results  Component Value Date   WBC 7.7 01/05/2020   NEUTROABS 5.5 01/05/2020   HGB 13.9 01/05/2020   HCT 41.6 01/05/2020   MCV 88.1 01/05/2020   PLT 208 01/05/2020     STUDIES: CT CHEST ABDOMEN PELVIS W CONTRAST  Result Date: 02/29/2020 CLINICAL DATA:  30 pound unintentional weight loss over the last 18 months. Mild abdominal pain. Reduced appetite. EXAM: CT CHEST, ABDOMEN, AND PELVIS WITH CONTRAST TECHNIQUE: Multidetector CT imaging of the chest, abdomen and pelvis was performed following the standard protocol during bolus administration of intravenous contrast. CONTRAST:  67mL OMNIPAQUE IOHEXOL 300 MG/ML  SOLN COMPARISON:  Multiple exams, including 11/12/2018 FINDINGS: CT CHEST FINDINGS Cardiovascular:  Mild atherosclerotic calcification of the aortic arch. Mediastinum/Nodes: Unremarkable Lungs/Pleura: Minimal biapical pleuroparenchymal scarring. Mild dependent scarring in both lower lobes, unchanged from 11/12/2018. Musculoskeletal: Solid ACDF at C6-7. Striking degenerative glenohumeral arthropathy bilaterally. Thoracic spondylosis. CT ABDOMEN PELVIS FINDINGS Hepatobiliary: Unremarkable Pancreas: Unremarkable Spleen: Unremarkable Adrenals/Urinary Tract: Unremarkable Stomach/Bowel: . suspected periampullary duodenal diverticulum. Postoperative findings in the proximal sigmoid colon with borderline prominence of stool leading up to this point but nondistention of the sigmoid colon and rectum. Normal appendix. Vascular/Lymphatic: Mild aortoiliac atherosclerotic vascular disease. Reproductive: Borderline prostatomegaly with associated dystrophic calcifications. Other: No supplemental non-categorized findings. Musculoskeletal: Posterolateral rod and pedicle screw fixation with solid interbody fusion at L4-L5-S1 and fused grade 1 anterolisthesis of L4 on L5. There is mild grade 1 degenerative retrolisthesis at L3-4 with degenerative endplate findings notable at L3-4. There is potentially some mild right foraminal stenosis at L5-S1 due to spurring at the fused level. Mild degenerative hip arthropathy bilaterally. IMPRESSION: 1. A specific  cause for the patient's weight loss is not identified. 2. Postoperative findings in the proximal sigmoid colon with borderline prominence of stool leading up to this point but nondistention of the sigmoid colon and rectum. 3. Borderline prostatomegaly. 4. Striking degenerative glenohumeral arthropathy bilaterally. 5. Aortic atherosclerosis. Aortic Atherosclerosis (ICD10-I70.0). Electronically Signed   By: Gaylyn Rong M.D.   On: 02/29/2020 13:38    ASSESSMENT: Weight loss, headaches.  PLAN:    1. Weight loss: Improving. CBC, metabolic panel, PSA, TSH and thyroid panel, B12,  folate are all within normal limits.  Colonoscopy and EGD in June 2021 did not reveal any distinct pathology. CT scan results from February 2022 reviewed independently and report as above with no obvious pathology that would cause weight loss. No further intervention is needed. No follow-up has been scheduled. Please refer patient back if there are any questions or concerns.  2.  Headaches: Resolved. Patient does not wish to have head CT tomorrow morning and this has been canceled.  I spent a total of 20 minutes reviewing chart data, face-to-face evaluation with the patient, counseling and coordination of care as detailed above.  Patient expressed understanding and was in agreement with this plan. He also understands that He can call clinic at any time with any questions, concerns, or complaints.    Jeralyn Ruths, MD   03/01/2020 6:55 AM

## 2020-02-28 NOTE — Progress Notes (Signed)
;   02/29/2020 8:19 AM   Larry Lin Dec 25, 1955 [id number]  Referring provider: Emi Belfast, FNP No address on file  Chief Complaint  Patient presents with  . Benign Prostatic Hypertrophy  . Erectile Dysfunction   Urological history: 1. Testosterone deficiency - testosterone 595 ng/dL in 75/6433 - HCT, hgb and LFT's WNL - managed with AndroGel 1.62%, 4 pumps daily  2. ED - SHIM 17 - contributing factors of age, BPH and testosterone deficiency - managed with tadalafil 5 mg daily  3. BPH with LU TS - PSA 0.2 ng/mL in 12/2019 - I PSS 15/3  3. Urgency - PVR 43 mL - managed with Gemtesa 75 mg every three days  4. Nocturia - has not been diagnosed with sleep apnea  HPI: Larry Lin is a 65 y.o. male who presents today for a 6 month follow up.      He has been taking two 5 mg tadalafil's to have satisfactory erections.  Patient still having spontaneous erections.  He denies any pain or curvature with erections.    SHIM    Row Name 02/29/20 1110         SHIM: Over the last 6 months:   How do you rate your confidence that you could get and keep an erection? High     When you had erections with sexual stimulation, how often were your erections hard enough for penetration (entering your partner)? Most Times (much more than half the time)     During sexual intercourse, how often were you able to maintain your erection after you had penetrated (entered) your partner? Sometimes (about half the time)     During sexual intercourse, how difficult was it to maintain your erection to completion of intercourse? Difficult     When you attempted sexual intercourse, how often was it satisfactory for you? Sometimes (about half the time)           SHIM Total Score   SHIM 17            Score: 1-7 Severe ED 8-11 Moderate ED 12-16 Mild-Moderate ED 17-21 Mild ED 22-25 No ED  He has been experiencing urinary hesitancy, frequency, urgency and postvoid  dribbling.  He is taking Gemtesa 75 mg every other day and this controls his symptoms.  Patient denies any modifying or aggravating factors.  Patient denies any gross hematuria, dysuria or suprapubic/flank pain.  Patient denies any fevers, chills, nausea or vomiting.   PVR 43 mL   IPSS    Row Name 02/29/20 1100         International Prostate Symptom Score   How often have you had the sensation of not emptying your bladder? Not at All     How often have you had to urinate less than every two hours? About half the time     How often have you found you stopped and started again several times when you urinated? Less than 1 in 5 times     How often have you found it difficult to postpone urination? More than half the time     How often have you had a weak urinary stream? About half the time     How often have you had to strain to start urination? About half the time     How many times did you typically get up at night to urinate? 1 Time     Total IPSS Score 15  Quality of Life due to urinary symptoms   If you were to spend the rest of your life with your urinary condition just the way it is now how would you feel about that? Mixed           PMH: Past Medical History:  Diagnosis Date  . Anemia   . Arthritis   . Corticostriatal-spinal degeneration (HCC)   . GERD (gastroesophageal reflux disease)   . Low testosterone     Surgical History: Past Surgical History:  Procedure Laterality Date  . BACK SURGERY  2006  . COLONOSCOPY WITH PROPOFOL N/A 12/04/2018   Procedure: COLONOSCOPY WITH PROPOFOL;  Surgeon: Toney Reil, MD;  Location: [facility] ENDOSCOPY;  Service: Gastroenterology;  Laterality: N/A;  . diverticulitis surgery     removed some of lower intestine  . ESOPHAGOGASTRODUODENOSCOPY (EGD) WITH PROPOFOL N/A 12/04/2018   Procedure: ESOPHAGOGASTRODUODENOSCOPY (EGD) WITH PROPOFOL;  Surgeon: Toney Reil, MD;  Location: [facility] ENDOSCOPY;  Service:  Gastroenterology;  Laterality: N/A;  . NECK SURGERY  2015  . VASECTOMY     only right side done for nerve pain    Home Medications:  Allergies as of 02/29/2020   No Known Allergies     Medication List       Accurate as of February 29, 2020 11:59 PM. If you have any questions, ask your nurse or doctor.        STOP taking these medications   cyclobenzaprine 10 MG tablet Commonly known as: FLEXERIL Stopped by: Jeralyn Ruths, MD   MAGNESIUM PO Stopped by: Jeralyn Ruths, MD     TAKE these medications   ALPRAZolam 0.25 MG tablet Commonly known as: XANAX Take 0.25 mg by mouth at bedtime as needed for anxiety.   ascorbic acid 100 MG tablet Commonly known as: VITAMIN C Take 1 tablet by mouth daily.   Baclofen 5 MG Tabs   Gemtesa 75 MG Tabs Generic drug: Vibegron Take 1 tablet by mouth daily.   MENS 50+ MULTI VITAMIN/MIN PO Take 1 tablet by mouth daily.   metroNIDAZOLE 1 % gel Commonly known as: Metrogel Apply topically daily.   morphine 30 MG 12 hr tablet Commonly known as: MS CONTIN Take 1 tablet by mouth 2 (two) times a day.   tadalafil 10 MG tablet Commonly known as: CIALIS Take 1 tablet (10 mg total) by mouth daily as needed for erectile dysfunction. What changed:   medication strength  See the new instructions. Changed by: Michiel Cowboy, PA-C   Testosterone 1.62 % Gel APPLY FOUR PUMPS BY TOPICAL ROUTE TO INTACT SKIN DAILY   VITAMIN D PO Take by mouth as needed. Twice a week       Allergies: No Known Allergies  Family History: Family History  Problem Relation Age of Onset  . Leukemia Mother   . Healthy Father   . Kidney disease Neg Hx   . Prostate cancer Neg Hx     Social History:  reports that he quit smoking about 33 years ago. He has never used smokeless tobacco. He reports previous alcohol use of about 1.0 standard drink of alcohol per week. He reports that he does not use drugs.  ROS: For pertinent review of systems please  refer to history of present illness  Physical Exam: BP 129/74   Pulse 76   Ht 6\' 2"  (1.88 m)   Wt 143 lb 4.8 oz (65 kg)   BMI 18.40 kg/m   Constitutional:  Well nourished. Alert and oriented,  No acute distress. HEENT: [place] AT, mask in place.  Trachea midline Cardiovascular: No clubbing, cyanosis, or edema. Respiratory: Normal respiratory effort, no increased work of breathing. Neurologic: Grossly intact, no focal deficits, moving all 4 extremities. Psychiatric: Normal mood and affect. Exam deferred as he took Barium for his scans this morning  Laboratory Data: Lab Results  Component Value Date   WBC 7.7 01/05/2020   HGB 13.9 01/05/2020   HCT 41.6 01/05/2020   MCV 88.1 01/05/2020   PLT 208 01/05/2020    Lab Results  Component Value Date   CREATININE 0.90 02/29/2020    Lab Results  Component Value Date   TESTOSTERONE 595 01/05/2020    Lab Results  Component Value Date   HGBA1C 5.5 11/25/2014    Lab Results  Component Value Date   AST 17 01/05/2020   Lab Results  Component Value Date   ALT 14 01/05/2020   Component     Latest Ref Rng & Units 04/25/2015 03/26/2017 03/26/2017 09/04/2017         11:49 AM  4:39 PM   Prostate Specific Ag, Serum     0.0 - 4.0 ng/mL 0.2 CANCELED 0.2 0.1   Component     Latest Ref Rng & Units 05/26/2019 01/05/2020           Prostate Specific Ag, Serum     0.0 - 4.0 ng/mL 0.2 0.2  I have reviewed the labs.   Pertinent Imaging Results for [facility] [place] [facility]Shon Hale (MRN [id number]) as of 02/29/2020 11:12  Ref. Range 02/29/2020 11:10  Scan Result Unknown 43    Assessment & Plan:    1. Testosterone deficiency Testosterone at therapeutic levels Continue AndroGel 1.62%, 4 pumps daily  2. Erectile dysfunction SHIM score is 17, it is improved Prescription given for Cialis 10 mg daily   3. BPH with LU TS I PSS 15/3 Continue conservative management, avoiding bladder irritants and timed voiding's Most bothersome symptom is  urgency/nocturia - at goal with Gemtesa 75 mg every three days Discussed undergoing cystoscopy for further evaluation for BOO, but he deferred at this time  Return in about 3 months (around 05/28/2020) for IPSS, SHIM, PVR, PSA and exam, testosterone, HCT, hgb .  These notes generated with voice recognition software. I apologize for typographical errors.  Michiel Cowboy, PA-C  [facility] [address] [place], [place] [address] [phone]

## 2020-02-29 ENCOUNTER — Ambulatory Visit
Admission: RE | Admit: 2020-02-29 | Discharge: 2020-02-29 | Disposition: A | Discharge status: Home / Self Care | Payer: Medicare HMO | Source: Ambulatory Visit | Attending: Oncology | Admitting: Oncology

## 2020-02-29 ENCOUNTER — Encounter: Payer: Self-pay | Admitting: Urology

## 2020-02-29 ENCOUNTER — Ambulatory Visit (INDEPENDENT_AMBULATORY_CARE_PROVIDER_SITE_OTHER): Payer: Medicare HMO | Admitting: Urology

## 2020-02-29 ENCOUNTER — Inpatient Hospital Stay: Payer: Medicare HMO | Attending: Oncology | Admitting: Oncology

## 2020-02-29 ENCOUNTER — Other Ambulatory Visit: Payer: Self-pay

## 2020-02-29 VITALS — BP 107/77 | HR 76 | Temp 99.0°F | Resp 17 | Wt 144.6 lb

## 2020-02-29 VITALS — BP 129/74 | HR 76 | Ht 74.0 in | Wt 143.3 lb

## 2020-02-29 DIAGNOSIS — J984 Other disorders of lung: Secondary | ICD-10-CM | POA: Diagnosis not present

## 2020-02-29 DIAGNOSIS — N529 Male erectile dysfunction, unspecified: Secondary | ICD-10-CM

## 2020-02-29 DIAGNOSIS — I700 Atherosclerosis of aorta: Secondary | ICD-10-CM | POA: Diagnosis not present

## 2020-02-29 DIAGNOSIS — M19012 Primary osteoarthritis, left shoulder: Secondary | ICD-10-CM | POA: Diagnosis not present

## 2020-02-29 DIAGNOSIS — M4316 Spondylolisthesis, lumbar region: Secondary | ICD-10-CM | POA: Diagnosis not present

## 2020-02-29 DIAGNOSIS — R634 Abnormal weight loss: Secondary | ICD-10-CM | POA: Insufficient documentation

## 2020-02-29 DIAGNOSIS — N401 Enlarged prostate with lower urinary tract symptoms: Secondary | ICD-10-CM

## 2020-02-29 DIAGNOSIS — D649 Anemia, unspecified: Secondary | ICD-10-CM | POA: Diagnosis not present

## 2020-02-29 DIAGNOSIS — N138 Other obstructive and reflux uropathy: Secondary | ICD-10-CM | POA: Diagnosis not present

## 2020-02-29 DIAGNOSIS — M47814 Spondylosis without myelopathy or radiculopathy, thoracic region: Secondary | ICD-10-CM | POA: Insufficient documentation

## 2020-02-29 DIAGNOSIS — K219 Gastro-esophageal reflux disease without esophagitis: Secondary | ICD-10-CM | POA: Insufficient documentation

## 2020-02-29 DIAGNOSIS — N400 Enlarged prostate without lower urinary tract symptoms: Secondary | ICD-10-CM | POA: Diagnosis not present

## 2020-02-29 DIAGNOSIS — M19011 Primary osteoarthritis, right shoulder: Secondary | ICD-10-CM | POA: Diagnosis not present

## 2020-02-29 DIAGNOSIS — M129 Arthropathy, unspecified: Secondary | ICD-10-CM | POA: Insufficient documentation

## 2020-02-29 DIAGNOSIS — R519 Headache, unspecified: Secondary | ICD-10-CM | POA: Diagnosis not present

## 2020-02-29 DIAGNOSIS — Z87891 Personal history of nicotine dependence: Secondary | ICD-10-CM | POA: Diagnosis not present

## 2020-02-29 DIAGNOSIS — R109 Unspecified abdominal pain: Secondary | ICD-10-CM | POA: Insufficient documentation

## 2020-02-29 DIAGNOSIS — E349 Endocrine disorder, unspecified: Secondary | ICD-10-CM

## 2020-02-29 DIAGNOSIS — Z79899 Other long term (current) drug therapy: Secondary | ICD-10-CM | POA: Insufficient documentation

## 2020-02-29 DIAGNOSIS — M160 Bilateral primary osteoarthritis of hip: Secondary | ICD-10-CM | POA: Diagnosis not present

## 2020-02-29 LAB — POCT I-STAT CREATININE: Creatinine, Ser: 0.9 mg/dL (ref 0.61–1.24)

## 2020-02-29 LAB — BLADDER SCAN AMB NON-IMAGING: Scan Result: 43

## 2020-02-29 MED ORDER — GEMTESA 75 MG PO TABS
1.0000 | ORAL_TABLET | Freq: Every day | ORAL | 0 refills | Status: DC
Start: 2020-02-29 — End: 2020-08-25

## 2020-02-29 MED ORDER — TADALAFIL 10 MG PO TABS: 10.0000 mg | ORAL_TABLET | Freq: Every day | ORAL | 3 refills | Status: DC | PRN

## 2020-02-29 MED ORDER — TESTOSTERONE 1.62 % TD GEL: TRANSDERMAL | 0 refills | Status: DC

## 2020-02-29 MED ORDER — IOHEXOL 300 MG/ML  SOLN
100.0000 mL | Freq: Once | INTRAMUSCULAR | Status: AC | PRN
  Administered 2020-02-29: 85 mL via INTRAVENOUS

## 2020-02-29 NOTE — Progress Notes (Unsigned)
Reports appetite is 50% normal. Denies N/V. Has chronic joint pains in shoulder and back. Takes MS contin with relief. Here for CT results today.

## 2020-03-01 ENCOUNTER — Ambulatory Visit: Payer: Medicare HMO

## 2020-04-11 DIAGNOSIS — Z79891 Long term (current) use of opiate analgesic: Secondary | ICD-10-CM | POA: Diagnosis not present

## 2020-04-11 DIAGNOSIS — F1120 Opioid dependence, uncomplicated: Secondary | ICD-10-CM | POA: Diagnosis not present

## 2020-04-24 DIAGNOSIS — M5011 Cervical disc disorder with radiculopathy,  high cervical region: Secondary | ICD-10-CM | POA: Diagnosis not present

## 2020-04-24 DIAGNOSIS — M5001 Cervical disc disorder with myelopathy,  high cervical region: Secondary | ICD-10-CM | POA: Diagnosis not present

## 2020-04-24 DIAGNOSIS — G894 Chronic pain syndrome: Secondary | ICD-10-CM | POA: Diagnosis not present

## 2020-04-24 DIAGNOSIS — M5440 Lumbago with sciatica, unspecified side: Secondary | ICD-10-CM | POA: Diagnosis not present

## 2020-04-24 DIAGNOSIS — F1120 Opioid dependence, uncomplicated: Secondary | ICD-10-CM | POA: Diagnosis not present

## 2020-04-24 DIAGNOSIS — G8929 Other chronic pain: Secondary | ICD-10-CM | POA: Diagnosis not present

## 2020-04-24 DIAGNOSIS — M961 Postlaminectomy syndrome, not elsewhere classified: Secondary | ICD-10-CM | POA: Diagnosis not present

## 2020-04-24 DIAGNOSIS — M542 Cervicalgia: Secondary | ICD-10-CM | POA: Diagnosis not present

## 2020-05-09 DIAGNOSIS — M25511 Pain in right shoulder: Secondary | ICD-10-CM | POA: Diagnosis not present

## 2020-05-09 DIAGNOSIS — M19012 Primary osteoarthritis, left shoulder: Secondary | ICD-10-CM | POA: Diagnosis not present

## 2020-05-09 DIAGNOSIS — M25541 Pain in joints of right hand: Secondary | ICD-10-CM | POA: Diagnosis not present

## 2020-05-09 DIAGNOSIS — M65331 Trigger finger, right middle finger: Secondary | ICD-10-CM | POA: Diagnosis not present

## 2020-05-09 DIAGNOSIS — M19011 Primary osteoarthritis, right shoulder: Secondary | ICD-10-CM | POA: Diagnosis not present

## 2020-05-31 ENCOUNTER — Ambulatory Visit: Payer: Self-pay | Admitting: Urology

## 2020-06-27 ENCOUNTER — Other Ambulatory Visit: Payer: Self-pay | Admitting: Urology

## 2020-06-27 DIAGNOSIS — F1120 Opioid dependence, uncomplicated: Secondary | ICD-10-CM | POA: Diagnosis not present

## 2020-06-27 DIAGNOSIS — Z79891 Long term (current) use of opiate analgesic: Secondary | ICD-10-CM | POA: Diagnosis not present

## 2020-07-03 DIAGNOSIS — M542 Cervicalgia: Secondary | ICD-10-CM | POA: Diagnosis not present

## 2020-07-03 DIAGNOSIS — M961 Postlaminectomy syndrome, not elsewhere classified: Secondary | ICD-10-CM | POA: Diagnosis not present

## 2020-07-03 DIAGNOSIS — G894 Chronic pain syndrome: Secondary | ICD-10-CM | POA: Diagnosis not present

## 2020-07-03 DIAGNOSIS — M5011 Cervical disc disorder with radiculopathy,  high cervical region: Secondary | ICD-10-CM | POA: Diagnosis not present

## 2020-07-03 DIAGNOSIS — M5440 Lumbago with sciatica, unspecified side: Secondary | ICD-10-CM | POA: Diagnosis not present

## 2020-07-03 DIAGNOSIS — F1120 Opioid dependence, uncomplicated: Secondary | ICD-10-CM | POA: Diagnosis not present

## 2020-07-03 DIAGNOSIS — G8929 Other chronic pain: Secondary | ICD-10-CM | POA: Diagnosis not present

## 2020-07-03 DIAGNOSIS — M5001 Cervical disc disorder with myelopathy,  high cervical region: Secondary | ICD-10-CM | POA: Diagnosis not present

## 2020-07-06 ENCOUNTER — Other Ambulatory Visit: Payer: Self-pay | Admitting: Urology

## 2020-07-12 ENCOUNTER — Other Ambulatory Visit: Payer: Self-pay | Admitting: Urology

## 2020-07-17 NOTE — Telephone Encounter (Signed)
Tried calling pt and his mailbox is full will try again later.

## 2020-07-17 NOTE — Telephone Encounter (Signed)
Please confirm with Larry Lin what pharmacy he would like his testosterone medication called into.

## 2020-07-18 NOTE — Telephone Encounter (Signed)
Mailbox full and on the other number his VM not set up.

## 2020-07-19 NOTE — Telephone Encounter (Signed)
Pt still not answering phone, please advise

## 2020-07-21 NOTE — Telephone Encounter (Signed)
Pt left voicemail on triage line asking for refill of testosterone asking for it to be sent to [facility] in [place]

## 2020-08-07 ENCOUNTER — Telehealth: Payer: Self-pay

## 2020-08-07 NOTE — Telephone Encounter (Signed)
Incoming call from Pharmacist at [facility] in [place]. She states that the patient's RX is for packets, however the patient would prefer the pump.    It appears a more recent RX has been sent in for the pump however was sent to a different pharmacy. Called pt to clarify. No answer. VM is full, unable to leave message.

## 2020-08-16 ENCOUNTER — Ambulatory Visit: Payer: Self-pay | Admitting: Urology

## 2020-08-24 ENCOUNTER — Ambulatory Visit: Payer: Self-pay | Admitting: Urology

## 2020-08-24 NOTE — Progress Notes (Signed)
;   08/25/2020 11:34 AM   Larry Lin 28-Jan-1955 [id number]  Referring provider: Emi Belfast, FNP [address] [place],  [place] [address]   Urological history: 1. Testosterone deficiency -contributing factors of age and chronic narcotics - testosterone pending  - HCT, hgb pending  - managed with AndroGel 1.62%, 4 pumps daily  2. ED - SHIM 20 - contributing factors of age, BPH and testosterone deficiency - managed with tadalafil 5 mg daily  3. BPH with LU TS - PSA pending - I PSS 9/2  3. Urgency - PVR 36 mL - managed with Gemtesa 75 mg every three days  4. Nocturia - has not been diagnosed with sleep apnea  Chief Complaint  Patient presents with   Hypogonadism     HPI: Larry Lin is a 65 y.o. male who presents today for a 6 month follow up.      Patient still having spontaneous erections.  He denies any pain or curvature with erections.   Having good response with PDE5i's.    SHIM     Row Name 08/25/20 1101         SHIM: Over the last 6 months:   How do you rate your confidence that you could get and keep an erection? High     When you had erections with sexual stimulation, how often were your erections hard enough for penetration (entering your partner)? Most Times (much more than half the time)     During sexual intercourse, how often were you able to maintain your erection after you had penetrated (entered) your partner? Most Times (much more than half the time)     During sexual intercourse, how difficult was it to maintain your erection to completion of intercourse? Slightly Difficult     When you attempted sexual intercourse, how often was it satisfactory for you? Most Times (much more than half the time)           SHIM Total Score     SHIM 20              Score: 1-7 Severe ED 8-11 Moderate ED 12-16 Mild-Moderate ED 17-21 Mild ED 22-25 No ED   He has a weak urinary stream in the morning.  Patient denies any  modifying or aggravating factors.  Patient denies any gross hematuria, dysuria or suprapubic/flank pain.  Patient denies any fevers, chills, nausea or vomiting.     IPSS     Row Name 08/25/20 1100         International Prostate Symptom Score   How often have you had the sensation of not emptying your bladder? Not at All     How often have you had to urinate less than every two hours? Less than half the time     How often have you found you stopped and started again several times when you urinated? Less than half the time     How often have you found it difficult to postpone urination? About half the time     How often have you had a weak urinary stream? Not at All     How often have you had to strain to start urination? Less than 1 in 5 times     How many times did you typically get up at night to urinate? 1 Time     Total IPSS Score 9           Quality of Life due to urinary symptoms  If you were to spend the rest of your life with your urinary condition just the way it is now how would you feel about that? Mostly Satisfied             Score:  1-7 Mild 8-19 Moderate 20-35 Severe    PMH: Past Medical History:  Diagnosis Date   Anemia    Arthritis    Corticostriatal-spinal degeneration (HCC)    GERD (gastroesophageal reflux disease)    Low testosterone     Surgical History: Past Surgical History:  Procedure Laterality Date   BACK SURGERY  2006   COLONOSCOPY WITH PROPOFOL N/A 12/04/2018   Procedure: COLONOSCOPY WITH PROPOFOL;  Surgeon: Toney Reil, MD;  Location: [facility] ENDOSCOPY;  Service: Gastroenterology;  Laterality: N/A;   diverticulitis surgery     removed some of lower intestine   ESOPHAGOGASTRODUODENOSCOPY (EGD) WITH PROPOFOL N/A 12/04/2018   Procedure: ESOPHAGOGASTRODUODENOSCOPY (EGD) WITH PROPOFOL;  Surgeon: Toney Reil, MD;  Location: [facility] ENDOSCOPY;  Service: Gastroenterology;  Laterality: N/A;   NECK SURGERY  2015   VASECTOMY     only  right side done for nerve pain    Home Medications:  Allergies as of 08/25/2020   No Known Allergies      Medication List        Accurate as of August 25, 2020 11:34 AM. If you have any questions, ask your nurse or doctor.          ALPRAZolam 0.25 MG tablet Commonly known as: XANAX Take 0.25 mg by mouth at bedtime as needed for anxiety.   ascorbic acid 100 MG tablet Commonly known as: VITAMIN C Take 1 tablet by mouth daily.   Baclofen 5 MG Tabs   Gemtesa 75 MG Tabs Generic drug: Vibegron Take 1 tablet by mouth daily.   MENS 50+ MULTI VITAMIN/MIN PO Take 1 tablet by mouth daily.   metroNIDAZOLE 1 % gel Commonly known as: Metrogel Apply topically daily.   morphine 30 MG 12 hr tablet Commonly known as: MS CONTIN Take 1 tablet by mouth 2 (two) times a day.   tadalafil 5 MG tablet Commonly known as: CIALIS Take 1 tablet (5 mg total) by mouth daily as needed for erectile dysfunction. What changed:  medication strength how much to take Changed by: Johannes Everage, PA-C   Testosterone 20.25 MG/ACT (1.62%) Gel APPLY FOUR PUMPS TO INTACT SKIN DAILY AS DIRECTED What changed: Another medication with the same name was removed. Continue taking this medication, and follow the directions you see here. Changed by: Michiel Cowboy, PA-C   VITAMIN D PO Take by mouth as needed. Twice a week        Allergies: No Known Allergies  Family History: Family History  Problem Relation Age of Onset   Leukemia Mother    Healthy Father    Kidney disease Neg Hx    Prostate cancer Neg Hx     Social History:  reports that he quit smoking about 33 years ago. His smoking use included cigarettes. He has never used smokeless tobacco. He reports previous alcohol use of about 1.0 standard drink of alcohol per week. He reports that he does not use drugs.  ROS: For pertinent review of systems please refer to history of present illness  Physical Exam: BP 107/73   Pulse 83   Ht 6'  2" (1.88 m)   Wt 144 lb (65.3 kg)   BMI 18.49 kg/m   Constitutional:  Well nourished. Alert and oriented, No acute  distress. HEENT: [place] AT, mask in place.  Trachea midline Cardiovascular: No clubbing, cyanosis, or edema. Respiratory: Normal respiratory effort, no increased work of breathing. GU: No CVA tenderness.  No bladder fullness or masses.  Patient with circumcised phallus. Urethral meatus is patent.  No penile discharge. No penile lesions or rashes. Scrotum without lesions, cysts, rashes and/or edema.  Testicles are located scrotally bilaterally. No masses are appreciated in the testicles. Left and right epididymis are normal. Rectal: Patient with  normal sphincter tone. Anus and perineum without scarring or rashes. No rectal masses are appreciated. Prostate is approximately 45 grams, no nodules are appreciated. Seminal vesicles could not be palpated Neurologic: Grossly intact, no focal deficits, moving all 4 extremities. Psychiatric: Normal mood and affect.   Laboratory Data: Lab Results  Component Value Date   CREATININE 0.90 02/29/2020    Component     Latest Ref Rng & Units 04/25/2015 03/26/2017 03/26/2017 09/04/2017         11:49 AM  4:39 PM   Prostate Specific Ag, Serum     0.0 - 4.0 ng/mL 0.2 CANCELED 0.2 0.1   Component     Latest Ref Rng & Units 05/26/2019 01/05/2020           Prostate Specific Ag, Serum     0.0 - 4.0 ng/mL 0.2 0.2  I have reviewed the labs.   Pertinent Imaging Results for [facility], Treyshon L "Shon Hale (MRN [id number]) as of 08/25/2020 11:16  Ref. Range 08/25/2020 11:14  Scan Result Unknown 36     Assessment & Plan:    1. Testosterone deficiency -Testosterone level pending -continue AndroGel 1.62%, 4 pumps daily-refill sent to pharmacy  2. Erectile dysfunction -continue tadalafil 5 mg daily, patient given a script   3. BPH with LU TS -PSA pending -DRE benign  -continue Gemtesa 75 mg every three days-refill sent to pharmacy  Return in about 6 months  (around 02/25/2021) for PSA, testosterone, H & H, IPSS, SHIM, PSA and exam.  These notes generated with voice recognition software. I apologize for typographical errors.  Michiel Cowboy, PA-C  [facility] [address] [place], [place] [address] [phone]

## 2020-08-25 ENCOUNTER — Other Ambulatory Visit: Payer: Self-pay

## 2020-08-25 ENCOUNTER — Ambulatory Visit: Payer: Medicare HMO | Admitting: Urology

## 2020-08-25 ENCOUNTER — Encounter: Payer: Self-pay | Admitting: Urology

## 2020-08-25 VITALS — BP 107/73 | HR 83 | Ht 74.0 in | Wt 144.0 lb

## 2020-08-25 DIAGNOSIS — E349 Endocrine disorder, unspecified: Secondary | ICD-10-CM

## 2020-08-25 DIAGNOSIS — N138 Other obstructive and reflux uropathy: Secondary | ICD-10-CM | POA: Diagnosis not present

## 2020-08-25 DIAGNOSIS — N529 Male erectile dysfunction, unspecified: Secondary | ICD-10-CM

## 2020-08-25 DIAGNOSIS — N401 Enlarged prostate with lower urinary tract symptoms: Secondary | ICD-10-CM

## 2020-08-25 DIAGNOSIS — M19012 Primary osteoarthritis, left shoulder: Secondary | ICD-10-CM | POA: Diagnosis not present

## 2020-08-25 DIAGNOSIS — M19011 Primary osteoarthritis, right shoulder: Secondary | ICD-10-CM | POA: Diagnosis not present

## 2020-08-25 LAB — BLADDER SCAN AMB NON-IMAGING: Scan Result: 36

## 2020-08-25 MED ORDER — TESTOSTERONE 20.25 MG/ACT (1.62%) TD GEL: TRANSDERMAL | 0 refills | Status: DC

## 2020-08-25 MED ORDER — GEMTESA 75 MG PO TABS: 1.0000 | ORAL_TABLET | Freq: Every day | ORAL | 0 refills | Status: DC

## 2020-08-25 MED ORDER — TADALAFIL 5 MG PO TABS: 5.0000 mg | ORAL_TABLET | Freq: Every day | ORAL | 3 refills | Status: DC | PRN

## 2020-08-26 LAB — HEMOGLOBIN AND HEMATOCRIT, BLOOD
Hematocrit: 46.2 % (ref 37.5–51.0)
Hemoglobin: 15.1 g/dL (ref 13.0–17.7)

## 2020-08-26 LAB — TESTOSTERONE: Testosterone: 970 ng/dL — ABNORMAL HIGH (ref 264–916)

## 2020-08-26 LAB — PSA: Prostate Specific Ag, Serum: 0.2 ng/mL (ref 0.0–4.0)

## 2020-08-28 DIAGNOSIS — M545 Low back pain, unspecified: Secondary | ICD-10-CM | POA: Diagnosis not present

## 2020-08-28 DIAGNOSIS — M5011 Cervical disc disorder with radiculopathy,  high cervical region: Secondary | ICD-10-CM | POA: Diagnosis not present

## 2020-08-28 DIAGNOSIS — M5440 Lumbago with sciatica, unspecified side: Secondary | ICD-10-CM | POA: Diagnosis not present

## 2020-08-28 DIAGNOSIS — M5412 Radiculopathy, cervical region: Secondary | ICD-10-CM | POA: Diagnosis not present

## 2020-08-28 DIAGNOSIS — M5001 Cervical disc disorder with myelopathy,  high cervical region: Secondary | ICD-10-CM | POA: Diagnosis not present

## 2020-08-28 DIAGNOSIS — M542 Cervicalgia: Secondary | ICD-10-CM | POA: Diagnosis not present

## 2020-08-28 DIAGNOSIS — M961 Postlaminectomy syndrome, not elsewhere classified: Secondary | ICD-10-CM | POA: Diagnosis not present

## 2020-08-28 DIAGNOSIS — G894 Chronic pain syndrome: Secondary | ICD-10-CM | POA: Diagnosis not present

## 2020-08-28 DIAGNOSIS — F1120 Opioid dependence, uncomplicated: Secondary | ICD-10-CM | POA: Diagnosis not present

## 2020-09-04 ENCOUNTER — Telehealth: Payer: Self-pay

## 2020-09-04 DIAGNOSIS — E349 Endocrine disorder, unspecified: Secondary | ICD-10-CM

## 2020-09-04 NOTE — Telephone Encounter (Signed)
Patient called requesting lab results, per MyChart notification: Your testosterone level is high, so you will need to decrease your gel to 3 pumps daily instead of the four.  We will recheck your levels in February. Thanks, Michiel Cowboy, PA-C  Patient was given message and he verbalized understanding. His appointment for February was previously made. Lab orders placed

## 2020-10-09 DIAGNOSIS — M5011 Cervical disc disorder with radiculopathy,  high cervical region: Secondary | ICD-10-CM | POA: Diagnosis not present

## 2020-10-09 DIAGNOSIS — M5001 Cervical disc disorder with myelopathy,  high cervical region: Secondary | ICD-10-CM | POA: Diagnosis not present

## 2020-10-09 DIAGNOSIS — M542 Cervicalgia: Secondary | ICD-10-CM | POA: Diagnosis not present

## 2020-10-09 DIAGNOSIS — M5440 Lumbago with sciatica, unspecified side: Secondary | ICD-10-CM | POA: Diagnosis not present

## 2020-10-09 DIAGNOSIS — M545 Low back pain, unspecified: Secondary | ICD-10-CM | POA: Diagnosis not present

## 2020-10-09 DIAGNOSIS — M961 Postlaminectomy syndrome, not elsewhere classified: Secondary | ICD-10-CM | POA: Diagnosis not present

## 2020-10-09 DIAGNOSIS — F1120 Opioid dependence, uncomplicated: Secondary | ICD-10-CM | POA: Diagnosis not present

## 2020-10-09 DIAGNOSIS — G894 Chronic pain syndrome: Secondary | ICD-10-CM | POA: Diagnosis not present

## 2020-10-09 DIAGNOSIS — M5412 Radiculopathy, cervical region: Secondary | ICD-10-CM | POA: Diagnosis not present

## 2020-12-04 DIAGNOSIS — G894 Chronic pain syndrome: Secondary | ICD-10-CM | POA: Diagnosis not present

## 2020-12-04 DIAGNOSIS — F1120 Opioid dependence, uncomplicated: Secondary | ICD-10-CM | POA: Diagnosis not present

## 2020-12-04 DIAGNOSIS — M5412 Radiculopathy, cervical region: Secondary | ICD-10-CM | POA: Diagnosis not present

## 2020-12-04 DIAGNOSIS — M5416 Radiculopathy, lumbar region: Secondary | ICD-10-CM | POA: Diagnosis not present

## 2020-12-04 DIAGNOSIS — F419 Anxiety disorder, unspecified: Secondary | ICD-10-CM | POA: Diagnosis not present

## 2020-12-04 DIAGNOSIS — M5001 Cervical disc disorder with myelopathy,  high cervical region: Secondary | ICD-10-CM | POA: Diagnosis not present

## 2020-12-04 DIAGNOSIS — M5011 Cervical disc disorder with radiculopathy,  high cervical region: Secondary | ICD-10-CM | POA: Diagnosis not present

## 2020-12-04 DIAGNOSIS — G8929 Other chronic pain: Secondary | ICD-10-CM | POA: Diagnosis not present

## 2020-12-04 DIAGNOSIS — K219 Gastro-esophageal reflux disease without esophagitis: Secondary | ICD-10-CM | POA: Diagnosis not present

## 2020-12-13 ENCOUNTER — Other Ambulatory Visit: Payer: Self-pay | Admitting: Urology

## 2020-12-13 DIAGNOSIS — E349 Endocrine disorder, unspecified: Secondary | ICD-10-CM

## 2021-01-01 DIAGNOSIS — F1120 Opioid dependence, uncomplicated: Secondary | ICD-10-CM | POA: Diagnosis not present

## 2021-01-01 DIAGNOSIS — M5416 Radiculopathy, lumbar region: Secondary | ICD-10-CM | POA: Diagnosis not present

## 2021-01-01 DIAGNOSIS — F419 Anxiety disorder, unspecified: Secondary | ICD-10-CM | POA: Diagnosis not present

## 2021-01-01 DIAGNOSIS — K219 Gastro-esophageal reflux disease without esophagitis: Secondary | ICD-10-CM | POA: Diagnosis not present

## 2021-01-01 DIAGNOSIS — M5011 Cervical disc disorder with radiculopathy,  high cervical region: Secondary | ICD-10-CM | POA: Diagnosis not present

## 2021-01-01 DIAGNOSIS — M5001 Cervical disc disorder with myelopathy,  high cervical region: Secondary | ICD-10-CM | POA: Diagnosis not present

## 2021-01-01 DIAGNOSIS — M5412 Radiculopathy, cervical region: Secondary | ICD-10-CM | POA: Diagnosis not present

## 2021-01-01 DIAGNOSIS — G8929 Other chronic pain: Secondary | ICD-10-CM | POA: Diagnosis not present

## 2021-01-01 DIAGNOSIS — G894 Chronic pain syndrome: Secondary | ICD-10-CM | POA: Diagnosis not present

## 2021-01-03 DIAGNOSIS — M19012 Primary osteoarthritis, left shoulder: Secondary | ICD-10-CM | POA: Diagnosis not present

## 2021-01-03 DIAGNOSIS — M7061 Trochanteric bursitis, right hip: Secondary | ICD-10-CM | POA: Diagnosis not present

## 2021-01-08 DIAGNOSIS — G894 Chronic pain syndrome: Secondary | ICD-10-CM | POA: Diagnosis not present

## 2021-01-29 DIAGNOSIS — M542 Cervicalgia: Secondary | ICD-10-CM | POA: Diagnosis not present

## 2021-01-29 DIAGNOSIS — G894 Chronic pain syndrome: Secondary | ICD-10-CM | POA: Diagnosis not present

## 2021-01-29 DIAGNOSIS — F1120 Opioid dependence, uncomplicated: Secondary | ICD-10-CM | POA: Diagnosis not present

## 2021-01-29 DIAGNOSIS — M5001 Cervical disc disorder with myelopathy,  high cervical region: Secondary | ICD-10-CM | POA: Diagnosis not present

## 2021-01-29 DIAGNOSIS — M545 Low back pain, unspecified: Secondary | ICD-10-CM | POA: Diagnosis not present

## 2021-01-29 DIAGNOSIS — G8929 Other chronic pain: Secondary | ICD-10-CM | POA: Diagnosis not present

## 2021-01-29 DIAGNOSIS — M961 Postlaminectomy syndrome, not elsewhere classified: Secondary | ICD-10-CM | POA: Diagnosis not present

## 2021-01-29 DIAGNOSIS — M5011 Cervical disc disorder with radiculopathy,  high cervical region: Secondary | ICD-10-CM | POA: Diagnosis not present

## 2021-01-29 DIAGNOSIS — M5440 Lumbago with sciatica, unspecified side: Secondary | ICD-10-CM | POA: Diagnosis not present

## 2021-01-29 DIAGNOSIS — M5412 Radiculopathy, cervical region: Secondary | ICD-10-CM | POA: Diagnosis not present

## 2021-02-05 DIAGNOSIS — G894 Chronic pain syndrome: Secondary | ICD-10-CM | POA: Diagnosis not present

## 2021-02-27 ENCOUNTER — Ambulatory Visit: Payer: Self-pay | Admitting: Urology

## 2021-03-26 DIAGNOSIS — M5412 Radiculopathy, cervical region: Secondary | ICD-10-CM | POA: Diagnosis not present

## 2021-03-26 DIAGNOSIS — K219 Gastro-esophageal reflux disease without esophagitis: Secondary | ICD-10-CM | POA: Diagnosis not present

## 2021-03-26 DIAGNOSIS — M5011 Cervical disc disorder with radiculopathy,  high cervical region: Secondary | ICD-10-CM | POA: Diagnosis not present

## 2021-03-26 DIAGNOSIS — F1120 Opioid dependence, uncomplicated: Secondary | ICD-10-CM | POA: Diagnosis not present

## 2021-03-26 DIAGNOSIS — G8929 Other chronic pain: Secondary | ICD-10-CM | POA: Diagnosis not present

## 2021-03-26 DIAGNOSIS — G894 Chronic pain syndrome: Secondary | ICD-10-CM | POA: Diagnosis not present

## 2021-03-26 DIAGNOSIS — M542 Cervicalgia: Secondary | ICD-10-CM | POA: Diagnosis not present

## 2021-03-26 DIAGNOSIS — M5416 Radiculopathy, lumbar region: Secondary | ICD-10-CM | POA: Diagnosis not present

## 2021-03-26 DIAGNOSIS — F419 Anxiety disorder, unspecified: Secondary | ICD-10-CM | POA: Diagnosis not present

## 2021-03-26 DIAGNOSIS — M5001 Cervical disc disorder with myelopathy,  high cervical region: Secondary | ICD-10-CM | POA: Diagnosis not present

## 2021-04-04 ENCOUNTER — Ambulatory Visit: Payer: Medicare HMO | Admitting: Urology

## 2021-04-04 ENCOUNTER — Other Ambulatory Visit: Payer: Self-pay

## 2021-04-04 ENCOUNTER — Encounter: Payer: Self-pay | Admitting: Urology

## 2021-04-04 VITALS — BP 113/76 | HR 78 | Ht 74.0 in | Wt 145.0 lb

## 2021-04-04 DIAGNOSIS — M7061 Trochanteric bursitis, right hip: Secondary | ICD-10-CM | POA: Diagnosis not present

## 2021-04-04 DIAGNOSIS — N529 Male erectile dysfunction, unspecified: Secondary | ICD-10-CM

## 2021-04-04 DIAGNOSIS — N401 Enlarged prostate with lower urinary tract symptoms: Secondary | ICD-10-CM

## 2021-04-04 DIAGNOSIS — N138 Other obstructive and reflux uropathy: Secondary | ICD-10-CM | POA: Diagnosis not present

## 2021-04-04 DIAGNOSIS — E349 Endocrine disorder, unspecified: Secondary | ICD-10-CM | POA: Diagnosis not present

## 2021-04-04 DIAGNOSIS — M19012 Primary osteoarthritis, left shoulder: Secondary | ICD-10-CM | POA: Diagnosis not present

## 2021-04-04 MED ORDER — TADALAFIL 5 MG PO TABS: 5.0000 mg | ORAL_TABLET | Freq: Every day | ORAL | 3 refills | Status: DC | PRN

## 2021-04-04 MED ORDER — TESTOSTERONE 20.25 MG/ACT (1.62%) TD GEL: TRANSDERMAL | 1 refills | Status: DC

## 2021-04-04 MED ORDER — GEMTESA 75 MG PO TABS: 1.0000 | ORAL_TABLET | Freq: Every day | ORAL | 0 refills | Status: DC

## 2021-04-04 NOTE — Progress Notes (Signed)
; ? ? ?04/04/2021 ?10:42 AM  ? ?[name] ?1956-01-19 ?[id number] ? ?Referring provider: [name], FNP ?[address] ?[place],  [place] [address] ? ? ?Urological history: ?1. Testosterone deficiency ?-contributing factors of age and chronic narcotics ?- testosterone pending  ?- HCT, hgb pending  ?- managed with AndroGel 1.62%, 4 pumps daily ? ?2. ED ?- SHIM 21 ?- contributing factors of age, BPH and testosterone deficiency ?- managed with tadalafil 5 mg daily ? ?3. BPH with LU TS ?- PSA pending ?- I PSS 14/2 ? ?3. Urgency ?- PVR 36 mL ?- managed with Gemtesa 75 mg every three days ? ?4. Nocturia ?- has not been diagnosed with sleep apnea ? ?Chief Complaint  ?Patient presents with  ? Benign Prostatic Hypertrophy  ? Testosterone deficiency  ?  ? ?HPI: ?Larry Lin is a 66 y.o. male who presents today for a 6 month follow up.     ? ?Patient still having spontaneous erections.  He denies any pain or curvature with erections.  Still having good response with PDE5i's. ? ? SHIM   ? ? [place] Name 04/04/21 1122  ?  ?  ?  ? SHIM: Over the last 6 months:  ? How do you rate your confidence that you could get and keep an erection? High    ? When you had erections with sexual stimulation, how often were your erections hard enough for penetration (entering your partner)? Most Times (much more than half the time)    ? During sexual intercourse, how often were you able to maintain your erection after you had penetrated (entered) your partner? Most Times (much more than half the time)    ? During sexual intercourse, how difficult was it to maintain your erection to completion of intercourse? Slightly Difficult    ? When you attempted sexual intercourse, how often was it satisfactory for you? Almost Always or Always    ?  ? SHIM Total Score  ? SHIM 21    ? ?  ?  ? ?  ? ? ? ?Score: ?1-7 Severe ED ?8-11 Moderate ED ?12-16 Mild-Moderate ED ?17-21 Mild ED ?22-25 No ED ? ?He is having urinary frequency, but it not  bothersome.  He takes the [place] every three days.  Patient denies any modifying or aggravating factors.  Patient denies any gross hematuria, dysuria or suprapubic/flank pain.  Patient denies any fevers, chills, nausea or vomiting.   ? ? IPSS   ? ? [place] Name 04/04/21 1100  ?  ?  ?  ? International Prostate Symptom Score  ? How often have you had the sensation of not emptying your bladder? Not at All    ? How often have you had to urinate less than every two hours? About half the time    ? How often have you found you stopped and started again several times when you urinated? Less than half the time    ? How often have you found it difficult to postpone urination? About half the time    ? How often have you had a weak urinary stream? More than half the time    ? How often have you had to strain to start urination? Less than 1 in 5 times    ? How many times did you typically get up at night to urinate? 1 Time    ? Total IPSS Score 14    ?  ? Quality of Life due to urinary symptoms  ?  If you were to spend the rest of your life with your urinary condition just the way it is now how would you feel about that? Mostly Satisfied    ? ?  ?  ? ?  ? ? ?Score:  ?1-7 Mild ?8-19 Moderate ?20-35 Severe  ? ? ?PMH: ?Past Medical History:  ?Diagnosis Date  ? Anemia   ? Arthritis   ? Corticostriatal-spinal degeneration ([place])   ? GERD (gastroesophageal reflux disease)   ? Low testosterone   ? ? ?Surgical History: ?Past Surgical History:  ?Procedure Laterality Date  ? BACK SURGERY  2006  ? COLONOSCOPY WITH PROPOFOL N/A 12/04/2018  ? Procedure: COLONOSCOPY WITH PROPOFOL;  Surgeon: Lin Landsman, MD;  [facility] END[facility]COPY;  Service: Gastroenterology;  Laterality: N/A;  ? diverticulitis surgery    ? removed some of lower intestine  ? ESOPHAGOGASTRODUODENOSCOPY (EGD) WITH PROPOFOL N/A 12/04/2018  ? Procedure: ESOPHAGOGASTRODUODENOSCOPY (EGD) WITH PROPOFOL;  Surgeon: Lin Landsman, MD;  Location: [facility] ENDOSCOPY;  Service:  Gastroenterology;  Laterality: N/A;  ? NECK SURGERY  2015  ? VASECTOMY    ? only right side done for nerve pain  ? ? ?Home Medications:  ?Allergies as of 04/04/2021   ?No Known Allergies ?  ? ?  ?Medication List  ?  ? ?  ? Accurate as of April 04, 2021 11:59 PM. If you have any questions, ask your nurse or doctor.  ?  ?  ? ?  ? ?ALPRAZolam 0.25 MG tablet ?Commonly known as: Duanne Moron ?Take 0.25 mg by mouth at bedtime as needed for anxiety. ?  ?ascorbic acid 100 MG tablet ?Commonly known as: VITAMIN C ?Take 1 tablet by mouth daily. ?  ?Baclofen 5 MG Tabs ?  ?Gemtesa 75 MG Tabs ?Generic drug: Vibegron ?Take 1 tablet by mouth daily. ?  ?MENS 50+ MULTI VITAMIN/MIN PO ?Take 1 tablet by mouth daily. ?  ?metroNIDAZOLE 1 % gel ?Commonly known as: Metrogel ?Apply topically daily. ?  ?morphine 30 MG 12 hr tablet ?Commonly known as: MS CONTIN ?Take 1 tablet by mouth 2 (two) times a day. ?  ?tadalafil 5 MG tablet ?Commonly known as: CIALIS ?Take 1 tablet (5 mg total) by mouth daily as needed for erectile dysfunction. ?  ?Testosterone 20.25 MG/ACT (1.62%) Gel ?APPLY THREE PUMPS TO THE SKIN DAILY AS DIRECTED ?  ?VITAMIN D PO ?Take by mouth as needed. Twice a week ?  ? ?  ? ? ?Allergies: No Known Allergies ? ?Family History: ?Family History  ?Problem Relation Age of Onset  ? Leukemia Mother   ? Healthy Father   ? Kidney disease Neg Hx   ? Prostate cancer Neg Hx   ? ? ?Social History:  reports that he quit smoking about 34 years ago. His smoking use included cigarettes. He has never used smokeless tobacco. He reports that he does not currently use alcohol after a past usage of about 1.0 standard drink per week. He reports that he does not use drugs. ? ?ROS: ?For pertinent review of systems please refer to history of present illness ? ?Physical Exam: ?BP 113/76   Pulse 78   Ht 6\' 2"  (1.88 m)   Wt 145 lb (65.8 kg)   BMI 18.62 kg/m?   ?Constitutional:  Well nourished. Alert and oriented, No acute distress. ?HEENT: [place] AT, mask in place   Trachea midline ?Cardiovascular: No clubbing, cyanosis, or edema. ?Respiratory: Normal respiratory effort, no increased work of breathing. ?Neurologic: Grossly intact, no focal deficits, moving all  4 extremities. ?Psychiatric: Normal mood and affect.  ? ?Laboratory Data: ?N/A ? ? ?Pertinent Imaging ?N/A ? ? ? ?Assessment & Plan:   ? ?1. Testosterone deficiency ?-Testosterone level pending ?-continue AndroGel 1.62%, 4 pumps daily-refill sent to pharmacy ? ?2. Erectile dysfunction ?-continue tadalafil 5 mg daily, patient given a script  ? ?3. BPH with LU TS ?-PSA pending ?-DRE benign  ?-continue Gemtesa 75 mg every three days-refill sent to pharmacy ? ?Return in about 6 months (around 10/05/2021) for PSA, testosterone, H & H, IPSS, SHIM and exam. ? ?These notes generated with voice recognition software. I apologize for typographical errors. ? ?Hamp Moreland, PA-C ? ?[place] ?[place], [place] [address] ?[phone] ? ?

## 2021-04-05 LAB — HEMOGLOBIN AND HEMATOCRIT, BLOOD
Hematocrit: 39 % (ref 37.5–51.0)
Hemoglobin: 13.5 g/dL (ref 13.0–17.7)

## 2021-04-05 LAB — TESTOSTERONE: Testosterone: 445 ng/dL (ref 264–916)

## 2021-04-05 LAB — PSA: Prostate Specific Ag, Serum: 0.3 ng/mL (ref 0.0–4.0)

## 2021-04-11 ENCOUNTER — Ambulatory Visit: Payer: Medicare HMO | Admitting: Urology

## 2021-05-03 DIAGNOSIS — G894 Chronic pain syndrome: Secondary | ICD-10-CM | POA: Diagnosis not present

## 2021-05-03 DIAGNOSIS — F1110 Opioid abuse, uncomplicated: Secondary | ICD-10-CM | POA: Diagnosis not present

## 2021-05-28 DIAGNOSIS — G8929 Other chronic pain: Secondary | ICD-10-CM | POA: Diagnosis not present

## 2021-05-28 DIAGNOSIS — M5412 Radiculopathy, cervical region: Secondary | ICD-10-CM | POA: Diagnosis not present

## 2021-05-28 DIAGNOSIS — M542 Cervicalgia: Secondary | ICD-10-CM | POA: Diagnosis not present

## 2021-05-28 DIAGNOSIS — Z79899 Other long term (current) drug therapy: Secondary | ICD-10-CM | POA: Diagnosis not present

## 2021-05-28 DIAGNOSIS — F1120 Opioid dependence, uncomplicated: Secondary | ICD-10-CM | POA: Diagnosis not present

## 2021-05-28 DIAGNOSIS — M5440 Lumbago with sciatica, unspecified side: Secondary | ICD-10-CM | POA: Diagnosis not present

## 2021-05-28 DIAGNOSIS — M545 Low back pain, unspecified: Secondary | ICD-10-CM | POA: Diagnosis not present

## 2021-05-28 DIAGNOSIS — M961 Postlaminectomy syndrome, not elsewhere classified: Secondary | ICD-10-CM | POA: Diagnosis not present

## 2021-05-28 DIAGNOSIS — M5011 Cervical disc disorder with radiculopathy,  high cervical region: Secondary | ICD-10-CM | POA: Diagnosis not present

## 2021-05-28 DIAGNOSIS — G894 Chronic pain syndrome: Secondary | ICD-10-CM | POA: Diagnosis not present

## 2021-05-28 DIAGNOSIS — M5001 Cervical disc disorder with myelopathy,  high cervical region: Secondary | ICD-10-CM | POA: Diagnosis not present

## 2021-07-04 DIAGNOSIS — K219 Gastro-esophageal reflux disease without esophagitis: Secondary | ICD-10-CM | POA: Diagnosis not present

## 2021-07-04 DIAGNOSIS — M5416 Radiculopathy, lumbar region: Secondary | ICD-10-CM | POA: Diagnosis not present

## 2021-07-04 DIAGNOSIS — G8929 Other chronic pain: Secondary | ICD-10-CM | POA: Diagnosis not present

## 2021-07-04 DIAGNOSIS — F1120 Opioid dependence, uncomplicated: Secondary | ICD-10-CM | POA: Diagnosis not present

## 2021-07-04 DIAGNOSIS — M5011 Cervical disc disorder with radiculopathy,  high cervical region: Secondary | ICD-10-CM | POA: Diagnosis not present

## 2021-07-04 DIAGNOSIS — F419 Anxiety disorder, unspecified: Secondary | ICD-10-CM | POA: Diagnosis not present

## 2021-07-04 DIAGNOSIS — M5001 Cervical disc disorder with myelopathy,  high cervical region: Secondary | ICD-10-CM | POA: Diagnosis not present

## 2021-07-04 DIAGNOSIS — M5412 Radiculopathy, cervical region: Secondary | ICD-10-CM | POA: Diagnosis not present

## 2021-07-04 DIAGNOSIS — M1990 Unspecified osteoarthritis, unspecified site: Secondary | ICD-10-CM | POA: Diagnosis not present

## 2021-07-04 DIAGNOSIS — G894 Chronic pain syndrome: Secondary | ICD-10-CM | POA: Diagnosis not present

## 2021-08-20 DIAGNOSIS — Z79899 Other long term (current) drug therapy: Secondary | ICD-10-CM | POA: Diagnosis not present

## 2021-08-20 DIAGNOSIS — G894 Chronic pain syndrome: Secondary | ICD-10-CM | POA: Diagnosis not present

## 2021-09-03 ENCOUNTER — Encounter: Payer: Medicare HMO | Admitting: Family

## 2021-09-03 DIAGNOSIS — M5412 Radiculopathy, cervical region: Secondary | ICD-10-CM | POA: Diagnosis not present

## 2021-09-03 DIAGNOSIS — F1120 Opioid dependence, uncomplicated: Secondary | ICD-10-CM | POA: Diagnosis not present

## 2021-09-03 DIAGNOSIS — M5011 Cervical disc disorder with radiculopathy,  high cervical region: Secondary | ICD-10-CM | POA: Diagnosis not present

## 2021-09-03 DIAGNOSIS — M1990 Unspecified osteoarthritis, unspecified site: Secondary | ICD-10-CM | POA: Diagnosis not present

## 2021-09-03 DIAGNOSIS — M5416 Radiculopathy, lumbar region: Secondary | ICD-10-CM | POA: Diagnosis not present

## 2021-09-03 DIAGNOSIS — M5001 Cervical disc disorder with myelopathy,  high cervical region: Secondary | ICD-10-CM | POA: Diagnosis not present

## 2021-09-03 DIAGNOSIS — G894 Chronic pain syndrome: Secondary | ICD-10-CM | POA: Diagnosis not present

## 2021-09-03 DIAGNOSIS — G8929 Other chronic pain: Secondary | ICD-10-CM | POA: Diagnosis not present

## 2021-09-03 DIAGNOSIS — K219 Gastro-esophageal reflux disease without esophagitis: Secondary | ICD-10-CM | POA: Diagnosis not present

## 2021-09-03 DIAGNOSIS — F419 Anxiety disorder, unspecified: Secondary | ICD-10-CM | POA: Diagnosis not present

## 2021-09-16 ENCOUNTER — Other Ambulatory Visit: Payer: Self-pay | Admitting: Urology

## 2021-09-16 DIAGNOSIS — E349 Endocrine disorder, unspecified: Secondary | ICD-10-CM

## 2021-09-24 ENCOUNTER — Other Ambulatory Visit: Payer: Self-pay | Admitting: Urology

## 2021-09-24 DIAGNOSIS — N138 Other obstructive and reflux uropathy: Secondary | ICD-10-CM

## 2021-10-05 ENCOUNTER — Ambulatory Visit: Payer: Medicare HMO | Admitting: Urology

## 2021-10-12 DIAGNOSIS — M19012 Primary osteoarthritis, left shoulder: Secondary | ICD-10-CM | POA: Diagnosis not present

## 2021-10-12 DIAGNOSIS — M7061 Trochanteric bursitis, right hip: Secondary | ICD-10-CM | POA: Diagnosis not present

## 2021-10-25 ENCOUNTER — Encounter: Payer: Medicare HMO | Admitting: Family

## 2021-10-29 DIAGNOSIS — F419 Anxiety disorder, unspecified: Secondary | ICD-10-CM | POA: Diagnosis not present

## 2021-10-29 DIAGNOSIS — M5416 Radiculopathy, lumbar region: Secondary | ICD-10-CM | POA: Diagnosis not present

## 2021-10-29 DIAGNOSIS — G894 Chronic pain syndrome: Secondary | ICD-10-CM | POA: Diagnosis not present

## 2021-10-29 DIAGNOSIS — G8929 Other chronic pain: Secondary | ICD-10-CM | POA: Diagnosis not present

## 2021-10-29 DIAGNOSIS — M5412 Radiculopathy, cervical region: Secondary | ICD-10-CM | POA: Diagnosis not present

## 2021-10-29 DIAGNOSIS — K219 Gastro-esophageal reflux disease without esophagitis: Secondary | ICD-10-CM | POA: Diagnosis not present

## 2021-10-29 DIAGNOSIS — M1990 Unspecified osteoarthritis, unspecified site: Secondary | ICD-10-CM | POA: Diagnosis not present

## 2021-10-29 DIAGNOSIS — M5001 Cervical disc disorder with myelopathy,  high cervical region: Secondary | ICD-10-CM | POA: Diagnosis not present

## 2021-10-29 DIAGNOSIS — F1120 Opioid dependence, uncomplicated: Secondary | ICD-10-CM | POA: Diagnosis not present

## 2021-10-29 DIAGNOSIS — M5011 Cervical disc disorder with radiculopathy,  high cervical region: Secondary | ICD-10-CM | POA: Diagnosis not present

## 2021-11-12 ENCOUNTER — Encounter: Payer: Medicare HMO | Admitting: Internal Medicine

## 2021-11-13 ENCOUNTER — Ambulatory Visit: Payer: Medicare HMO | Admitting: Urology

## 2021-12-31 ENCOUNTER — Encounter: Payer: Medicare HMO | Admitting: Internal Medicine

## 2022-01-02 ENCOUNTER — Ambulatory Visit: Payer: Medicare HMO | Admitting: Urology

## 2022-01-02 DIAGNOSIS — M5412 Radiculopathy, cervical region: Secondary | ICD-10-CM | POA: Diagnosis not present

## 2022-01-02 DIAGNOSIS — M5416 Radiculopathy, lumbar region: Secondary | ICD-10-CM | POA: Diagnosis not present

## 2022-01-02 DIAGNOSIS — M542 Cervicalgia: Secondary | ICD-10-CM | POA: Diagnosis not present

## 2022-01-02 DIAGNOSIS — G8929 Other chronic pain: Secondary | ICD-10-CM | POA: Diagnosis not present

## 2022-01-02 DIAGNOSIS — M1990 Unspecified osteoarthritis, unspecified site: Secondary | ICD-10-CM | POA: Diagnosis not present

## 2022-01-02 DIAGNOSIS — G894 Chronic pain syndrome: Secondary | ICD-10-CM | POA: Diagnosis not present

## 2022-01-02 DIAGNOSIS — K219 Gastro-esophageal reflux disease without esophagitis: Secondary | ICD-10-CM | POA: Diagnosis not present

## 2022-01-02 DIAGNOSIS — M5440 Lumbago with sciatica, unspecified side: Secondary | ICD-10-CM | POA: Diagnosis not present

## 2022-01-02 DIAGNOSIS — F419 Anxiety disorder, unspecified: Secondary | ICD-10-CM | POA: Diagnosis not present

## 2022-01-03 ENCOUNTER — Ambulatory Visit: Payer: Medicare HMO | Admitting: Urology

## 2022-01-08 ENCOUNTER — Other Ambulatory Visit: Payer: Self-pay | Admitting: Urology

## 2022-01-08 DIAGNOSIS — E349 Endocrine disorder, unspecified: Secondary | ICD-10-CM

## 2022-01-15 ENCOUNTER — Encounter: Payer: Medicare HMO | Admitting: Internal Medicine

## 2022-01-16 DIAGNOSIS — Z79899 Other long term (current) drug therapy: Secondary | ICD-10-CM | POA: Diagnosis not present

## 2022-02-05 ENCOUNTER — Ambulatory Visit: Payer: Medicare HMO | Admitting: Urology

## 2022-02-14 DIAGNOSIS — F1120 Opioid dependence, uncomplicated: Secondary | ICD-10-CM | POA: Diagnosis not present

## 2022-02-14 DIAGNOSIS — Z79891 Long term (current) use of opiate analgesic: Secondary | ICD-10-CM | POA: Diagnosis not present

## 2022-02-27 DIAGNOSIS — G894 Chronic pain syndrome: Secondary | ICD-10-CM | POA: Diagnosis not present

## 2022-02-27 DIAGNOSIS — M1990 Unspecified osteoarthritis, unspecified site: Secondary | ICD-10-CM | POA: Diagnosis not present

## 2022-02-27 DIAGNOSIS — F1120 Opioid dependence, uncomplicated: Secondary | ICD-10-CM | POA: Diagnosis not present

## 2022-02-27 DIAGNOSIS — M791 Myalgia, unspecified site: Secondary | ICD-10-CM | POA: Diagnosis not present

## 2022-02-27 DIAGNOSIS — M961 Postlaminectomy syndrome, not elsewhere classified: Secondary | ICD-10-CM | POA: Diagnosis not present

## 2022-02-27 DIAGNOSIS — F419 Anxiety disorder, unspecified: Secondary | ICD-10-CM | POA: Diagnosis not present

## 2022-02-27 DIAGNOSIS — K219 Gastro-esophageal reflux disease without esophagitis: Secondary | ICD-10-CM | POA: Diagnosis not present

## 2022-02-27 DIAGNOSIS — M5001 Cervical disc disorder with myelopathy,  high cervical region: Secondary | ICD-10-CM | POA: Diagnosis not present

## 2022-02-27 DIAGNOSIS — M5011 Cervical disc disorder with radiculopathy,  high cervical region: Secondary | ICD-10-CM | POA: Diagnosis not present

## 2022-03-07 ENCOUNTER — Encounter: Payer: Medicare HMO | Admitting: Internal Medicine

## 2022-03-07 ENCOUNTER — Ambulatory Visit: Payer: Medicare HMO | Admitting: Urology

## 2022-03-12 NOTE — Progress Notes (Unsigned)
;   04/04/2021 10:42 AM   Larry Lin [birth year] [id number]  Referring provider: [name], FNP [address] [place],  [place] [address]   Urological history: 1. Testosterone deficiency - contributing factors of age and chronic narcotics - testosterone pending  - HCT, hgb pending  - AndroGel 1.62%, 4 pumps daily  2. ED - contributing factors of age, BPH and testosterone deficiency - SHIM *** - tadalafil 5 mg daily  3. BPH with LU TS - PSA pending - I PSS ***  3. Urgency - Contributing factors of age, BPH, lumbar spine issues, anxiety and history of smoking - PVR *** mL - Gemtesa 75 mg every three days  4. Nocturia -Risk factors for nocturia: ?obstructive sleep apnea, hypertension, heart disease, anxiety and BPH.    - has not been diagnosed with sleep apnea  Chief Complaint  Patient presents with   Benign Prostatic Hypertrophy   Testosterone deficiency     HPI: Larry Lin is a 67 y.o. male who presents today for a 6 month follow up.      SHIM ***  Patient still having spontaneous erections.  ***   He denies any pain or curvature with erections.    SHIM     Row Name 04/04/21 1122         SHIM: Over the last 6 months:   How do you rate your confidence that you could get and keep an erection? High     When you had erections with sexual stimulation, how often were your erections hard enough for penetration (entering your partner)? Most Times (much more than half the time)     During sexual intercourse, how often were you able to maintain your erection after you had penetrated (entered) your partner? Most Times (much more than half the time)     During sexual intercourse, how difficult was it to maintain your erection to completion of intercourse? Slightly Difficult     When you attempted sexual intercourse, how often was it satisfactory for you? Almost Always or Always       SHIM Total Score   SHIM 21               Score: 1-7  Severe ED 8-11 Moderate ED 12-16 Mild-Moderate ED 17-21 Mild ED 22-25 No ED  I PSS ***    IPSS     Row Name 04/04/21 1100         International Prostate Symptom Score   How often have you had the sensation of not emptying your bladder? Not at All     How often have you had to urinate less than every two hours? About half the time     How often have you found you stopped and started again several times when you urinated? Less than half the time     How often have you found it difficult to postpone urination? About half the time     How often have you had a weak urinary stream? More than half the time     How often have you had to strain to start urination? Less than 1 in 5 times     How many times did you typically get up at night to urinate? 1 Time     Total IPSS Score 14       Quality of Life due to urinary symptoms   If you were to spend the rest of your life with your urinary condition just  the way it is now how would you feel about that? Mostly Satisfied              Score:  1-7 Mild 8-19 Moderate 20-35 Severe    PMH: Past Medical History:  Diagnosis Date   Anemia    Arthritis    Corticostriatal-spinal degeneration (HCC)    GERD (gastroesophageal reflux disease)    Low testosterone     Surgical History: Past Surgical History:  Procedure Laterality Date   BACK SURGERY  2006   COLONOSCOPY WITH PROPOFOL N/A 12/04/2018   Procedure: COLONOSCOPY WITH PROPOFOL;  Surgeon: Lin Landsman, MD;  Location: [facility] ENDOSCOPY;  Service: Gastroenterology;  Laterality: N/A;   diverticulitis surgery     removed some of lower intestine   ESOPHAGOGASTRODUODENOSCOPY (EGD) WITH PROPOFOL N/A 12/04/2018   Procedure: ESOPHAGOGASTRODUODENOSCOPY (EGD) WITH PROPOFOL;  Surgeon: Lin Landsman, MD;  Location: [place];  Service: Gastroenterology;  Laterality: N/A;   NECK SURGERY  2015   VASECTOMY     only right side done for nerve pain    Home Medications:   Allergies as of 04/04/2021   No Known Allergies      Medication List        Accurate as of April 04, 2021 11:59 PM. If you have any questions, ask your nurse or doctor.          ALPRAZolam 0.25 MG tablet Commonly known as: XANAX Take 0.25 mg by mouth at bedtime as needed for anxiety.   ascorbic acid 100 MG tablet Commonly known as: VITAMIN C Take 1 tablet by mouth daily.   Baclofen 5 MG Tabs   Gemtesa 75 MG Tabs Generic drug: Vibegron Take 1 tablet by mouth daily.   MENS 50+ MULTI VITAMIN/MIN PO Take 1 tablet by mouth daily.   metroNIDAZOLE 1 % gel Commonly known as: Metrogel Apply topically daily.   morphine 30 MG 12 hr tablet Commonly known as: MS CONTIN Take 1 tablet by mouth 2 (two) times a day.   tadalafil 5 MG tablet Commonly known as: CIALIS Take 1 tablet (5 mg total) by mouth daily as needed for erectile dysfunction.   Testosterone 20.25 MG/ACT (1.62%) Gel APPLY THREE PUMPS TO THE SKIN DAILY AS DIRECTED   VITAMIN D PO Take by mouth as needed. Twice a week        Allergies: No Known Allergies  Family History: Family History  Problem Relation Age of Onset   Leukemia Mother    Healthy Father    Kidney disease Neg Hx    Prostate cancer Neg Hx     Social History:  reports that he quit smoking about 34 years ago. His smoking use included cigarettes. He has never used smokeless tobacco. He reports that he does not currently use alcohol after a past usage of about 1.0 standard drink per week. He reports that he does not use drugs.  ROS: For pertinent review of systems please refer to history of present illness  Physical Exam: BP 113/76   Pulse 78   Ht 6' 2"$  (1.88 m)   Wt 145 lb (65.8 kg)   BMI 18.62 kg/m   Constitutional:  Well nourished. Alert and oriented, No acute distress. HEENT: [place] AT, moist mucus membranes.  Trachea midline Cardiovascular: No clubbing, cyanosis, or edema. Respiratory: Normal respiratory effort, no increased work  of breathing. GU: No CVA tenderness.  No bladder fullness or masses.  Patient with circumcised/uncircumcised phallus. ***Foreskin easily retracted***  Urethral meatus is patent.  No penile discharge. No penile lesions or rashes. Scrotum without lesions, cysts, rashes and/or edema.  Testicles are located scrotally bilaterally. No masses are appreciated in the testicles. Left and right epididymis are normal. Rectal: Patient with  normal sphincter tone. Anus and perineum without scarring or rashes. No rectal masses are appreciated. Prostate is approximately *** grams, *** nodules are appreciated. Seminal vesicles are normal. Neurologic: Grossly intact, no focal deficits, moving all 4 extremities. Psychiatric: Normal mood and affect.   Laboratory Data: N/A   Pertinent Imaging N/A    Assessment & Plan:    1. Testosterone deficiency -Testosterone level pending -continue AndroGel 1.62%, 4 pumps daily-refill sent to pharmacy  2. Erectile dysfunction -continue tadalafil 5 mg daily, patient given a script   3. BPH with LU TS -PSA pending -DRE benign  -continue Gemtesa 75 mg every three days-refill sent to pharmacy  Return in about 6 months (around 10/05/2021) for PSA, testosterone, H & H, IPSS, SHIM and exam.  These notes generated with voice recognition software. I apologize for typographical errors.  [place], [place] [address] [place], [place] [address] [phone]

## 2022-03-13 ENCOUNTER — Ambulatory Visit: Payer: Medicare HMO | Admitting: Urology

## 2022-03-13 ENCOUNTER — Encounter: Payer: Self-pay | Admitting: Urology

## 2022-03-13 VITALS — BP 96/67 | HR 72 | Ht 74.0 in | Wt 133.0 lb

## 2022-03-13 DIAGNOSIS — E349 Endocrine disorder, unspecified: Secondary | ICD-10-CM

## 2022-03-13 DIAGNOSIS — N529 Male erectile dysfunction, unspecified: Secondary | ICD-10-CM

## 2022-03-13 DIAGNOSIS — N138 Other obstructive and reflux uropathy: Secondary | ICD-10-CM | POA: Diagnosis not present

## 2022-03-13 DIAGNOSIS — M19012 Primary osteoarthritis, left shoulder: Secondary | ICD-10-CM | POA: Diagnosis not present

## 2022-03-13 DIAGNOSIS — E291 Testicular hypofunction: Secondary | ICD-10-CM

## 2022-03-13 DIAGNOSIS — N401 Enlarged prostate with lower urinary tract symptoms: Secondary | ICD-10-CM

## 2022-03-13 DIAGNOSIS — G959 Disease of spinal cord, unspecified: Secondary | ICD-10-CM | POA: Diagnosis not present

## 2022-03-13 DIAGNOSIS — M7061 Trochanteric bursitis, right hip: Secondary | ICD-10-CM | POA: Diagnosis not present

## 2022-03-13 MED ORDER — TESTOSTERONE 20.25 MG/ACT (1.62%) TD GEL: TRANSDERMAL | 5 refills | Status: DC

## 2022-03-13 MED ORDER — TADALAFIL 5 MG PO TABS: 5.0000 mg | ORAL_TABLET | Freq: Every day | ORAL | 3 refills | Status: DC | PRN

## 2022-03-14 ENCOUNTER — Encounter: Payer: Self-pay | Admitting: *Deleted

## 2022-03-14 LAB — HEMOGLOBIN AND HEMATOCRIT, BLOOD
Hematocrit: 38.4 % (ref 37.5–51.0)
Hemoglobin: 12.8 g/dL — ABNORMAL LOW (ref 13.0–17.7)

## 2022-03-14 LAB — TESTOSTERONE: Testosterone: 468 ng/dL (ref 264–916)

## 2022-03-14 LAB — PSA: Prostate Specific Ag, Serum: 0.2 ng/mL (ref 0.0–4.0)

## 2022-03-20 ENCOUNTER — Telehealth: Payer: Self-pay | Admitting: Urology

## 2022-03-20 NOTE — Telephone Encounter (Signed)
Pt LMOM that he needs a PA for his testosterone.  What he has is expired.

## 2022-03-22 NOTE — Telephone Encounter (Signed)
Pt called in again wanting to know the status of the testosterone.  Please advise  [phone]  Thank you

## 2022-03-27 NOTE — Telephone Encounter (Signed)
PA done, was approved.

## 2022-04-05 NOTE — Telephone Encounter (Signed)
Faxed came in today and testosterone gel was approved until 01/21/2023

## 2022-04-11 ENCOUNTER — Encounter: Payer: Medicare HMO | Admitting: Nurse Practitioner

## 2022-05-01 DIAGNOSIS — I959 Hypotension, unspecified: Secondary | ICD-10-CM | POA: Diagnosis not present

## 2022-05-02 ENCOUNTER — Encounter: Payer: Medicare HMO | Admitting: Nurse Practitioner

## 2022-05-07 ENCOUNTER — Ambulatory Visit: Payer: Medicare HMO | Admitting: Cardiovascular Disease

## 2022-05-10 DIAGNOSIS — Z79891 Long term (current) use of opiate analgesic: Secondary | ICD-10-CM | POA: Diagnosis not present

## 2022-05-16 ENCOUNTER — Other Ambulatory Visit: Payer: Self-pay | Admitting: Urology

## 2022-05-16 DIAGNOSIS — N401 Enlarged prostate with lower urinary tract symptoms: Secondary | ICD-10-CM

## 2022-05-16 MED ORDER — GEMTESA 75 MG PO TABS: 1.0000 | ORAL_TABLET | Freq: Every day | ORAL | 0 refills | Status: DC

## 2022-05-21 DIAGNOSIS — I358 Other nonrheumatic aortic valve disorders: Secondary | ICD-10-CM | POA: Diagnosis not present

## 2022-05-21 DIAGNOSIS — R5383 Other fatigue: Secondary | ICD-10-CM | POA: Diagnosis not present

## 2022-05-21 DIAGNOSIS — R413 Other amnesia: Secondary | ICD-10-CM | POA: Diagnosis not present

## 2022-05-21 DIAGNOSIS — R300 Dysuria: Secondary | ICD-10-CM | POA: Diagnosis not present

## 2022-05-28 ENCOUNTER — Encounter: Payer: Medicare HMO | Admitting: Nurse Practitioner

## 2022-06-07 ENCOUNTER — Ambulatory Visit (INDEPENDENT_AMBULATORY_CARE_PROVIDER_SITE_OTHER): Payer: Medicare HMO | Admitting: Nurse Practitioner

## 2022-06-07 ENCOUNTER — Ambulatory Visit: Payer: Medicare HMO | Attending: Cardiovascular Disease | Admitting: Cardiology

## 2022-06-07 ENCOUNTER — Encounter: Payer: Self-pay | Admitting: Nurse Practitioner

## 2022-06-07 ENCOUNTER — Encounter: Payer: Self-pay | Admitting: Cardiology

## 2022-06-07 VITALS — BP 112/72 | HR 85 | Ht 74.0 in | Wt 129.4 lb

## 2022-06-07 VITALS — BP 98/60 | HR 69 | Temp 98.6°F | Ht 74.0 in | Wt 128.8 lb

## 2022-06-07 DIAGNOSIS — R634 Abnormal weight loss: Secondary | ICD-10-CM

## 2022-06-07 DIAGNOSIS — R0602 Shortness of breath: Secondary | ICD-10-CM

## 2022-06-07 DIAGNOSIS — M542 Cervicalgia: Secondary | ICD-10-CM

## 2022-06-07 DIAGNOSIS — G8929 Other chronic pain: Secondary | ICD-10-CM

## 2022-06-07 DIAGNOSIS — F411 Generalized anxiety disorder: Secondary | ICD-10-CM | POA: Diagnosis not present

## 2022-06-07 DIAGNOSIS — M549 Dorsalgia, unspecified: Secondary | ICD-10-CM

## 2022-06-07 DIAGNOSIS — E7800 Pure hypercholesterolemia, unspecified: Secondary | ICD-10-CM

## 2022-06-07 DIAGNOSIS — R9431 Abnormal electrocardiogram [ECG] [EKG]: Secondary | ICD-10-CM | POA: Diagnosis not present

## 2022-06-07 DIAGNOSIS — I959 Hypotension, unspecified: Secondary | ICD-10-CM | POA: Diagnosis not present

## 2022-06-07 DIAGNOSIS — R6889 Other general symptoms and signs: Secondary | ICD-10-CM | POA: Diagnosis not present

## 2022-06-07 DIAGNOSIS — L659 Nonscarring hair loss, unspecified: Secondary | ICD-10-CM | POA: Diagnosis not present

## 2022-06-07 DIAGNOSIS — K219 Gastro-esophageal reflux disease without esophagitis: Secondary | ICD-10-CM

## 2022-06-07 LAB — CBC WITH DIFFERENTIAL/PLATELET
Basophils Absolute: 0 10*3/uL (ref 0.0–0.1)
Basophils Relative: 0.4 % (ref 0.0–3.0)
Eosinophils Absolute: 0.1 10*3/uL (ref 0.0–0.7)
Eosinophils Relative: 0.6 % (ref 0.0–5.0)
HCT: 39.5 % (ref 39.0–52.0)
Hemoglobin: 13.2 g/dL (ref 13.0–17.0)
Lymphocytes Relative: 20.1 % (ref 12.0–46.0)
Lymphs Abs: 1.9 10*3/uL (ref 0.7–4.0)
MCHC: 33.5 g/dL (ref 30.0–36.0)
MCV: 91.3 fl (ref 78.0–100.0)
Monocytes Absolute: 0.6 10*3/uL (ref 0.1–1.0)
Monocytes Relative: 6.5 % (ref 3.0–12.0)
Neutro Abs: 6.7 10*3/uL (ref 1.4–7.7)
Neutrophils Relative %: 72.4 % (ref 43.0–77.0)
Platelets: 265 10*3/uL (ref 150.0–400.0)
RBC: 4.33 Mil/uL (ref 4.22–5.81)
RDW: 14.5 % (ref 11.5–15.5)
WBC: 9.3 10*3/uL (ref 4.0–10.5)

## 2022-06-07 LAB — COMPREHENSIVE METABOLIC PANEL
ALT: 15 U/L (ref 0–53)
AST: 19 U/L (ref 0–37)
Albumin: 4.5 g/dL (ref 3.5–5.2)
Alkaline Phosphatase: 49 U/L (ref 39–117)
BUN: 17 mg/dL (ref 6–23)
CO2: 32 mEq/L (ref 19–32)
Calcium: 9.7 mg/dL (ref 8.4–10.5)
Chloride: 99 mEq/L (ref 96–112)
Creatinine, Ser: 0.73 mg/dL (ref 0.40–1.50)
GFR: 95.39 mL/min (ref >60.00)
Glucose, Bld: 91 mg/dL (ref 70–99)
Potassium: 4.2 mEq/L (ref 3.5–5.1)
Sodium: 137 mEq/L (ref 135–145)
Total Bilirubin: 0.8 mg/dL (ref 0.2–1.2)
Total Protein: 7.9 g/dL (ref 6.0–8.3)

## 2022-06-07 LAB — LIPID PANEL
Cholesterol: 185 mg/dL (ref 0–200)
HDL: 62.2 mg/dL (ref >39.00)
LDL Cholesterol: 109 mg/dL — ABNORMAL HIGH (ref 0–99)
NonHDL: 123.1
Total CHOL/HDL Ratio: 3
Triglycerides: 70 mg/dL (ref 0.0–149.0)
VLDL: 14 mg/dL (ref 0.0–40.0)

## 2022-06-07 MED ORDER — SODIUM CHLORIDE 1 G PO TABS: 1.0000 g | ORAL_TABLET | Freq: Three times a day (TID) | ORAL | 3 refills | Status: AC

## 2022-06-07 NOTE — Patient Instructions (Signed)
Medication Instructions:  Your physician has recommended you make the following change in your medication:   START - sodium chloride 1 g tablet - Take 1 tablet (1 g total) by mouth 3 (three) times daily with meal  *If you need a refill on your cardiac medications before your next appointment, please call your pharmacy*   Lab Work: -None ordered If you have labs (blood work) drawn today and your tests are completely normal, you will receive your results only by: MyChart Message (if you have MyChart) OR A paper copy in the mail If you have any lab test that is abnormal or we need to change your treatment, we will call you to review the results.   Testing/Procedures: Your physician has requested that you have an echocardiogram. Echocardiography is a painless test that uses sound waves to create images of your heart. It provides your doctor with information about the size and shape of your heart and how well your heart's chambers and valves are working. This procedure takes approximately one hour. There are no restrictions for this procedure. Please do NOT wear cologne, perfume, aftershave, or lotions (deodorant is allowed). Please arrive 15 minutes prior to your appointment time.    Follow-Up: At [facility], you and your health needs are our priority.  As part of our continuing mission to provide you with exceptional heart care, we have created designated Provider Care Teams.  These Care Teams include your primary Cardiologist (physician) and Advanced Practice Providers (APPs -  Physician Assistants and Nurse Practitioners) who all work together to provide you with the care you need, when you need it.  Your next appointment:   6 month(s)  Provider:   You may see Debbe Odea, MD or one of the following Advanced Practice Providers on your designated Care Team:   Nicolasa Ducking, NP Eula Listen, PA-C Cadence Fransico Michael, PA-C Charlsie Quest, NP    Other Instructions -None

## 2022-06-07 NOTE — Progress Notes (Unsigned)
Established Patient Office Visit  Subjective:  Patient ID: Larry Lin, male    DOB: June 29, 1955  Age: 67 y.o. MRN: [id number]  CC:  Chief Complaint  Patient presents with   Transitions Of Care    Discuss low blood pressure readings     HPI  Larry Lin presents for transfer of care. He lives on a boat at [employer]. He complaint of low blood pressure from 2-3 months. He has an appointment with cardiology for low BP readings.   He feel extreme fatigued and more sleepy  during the day time.  He have lost 5 lb since February 2024.  He is followed by Dr. 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       HPI   Past Medical History:  Diagnosis Date   Anemia    Arthritis    Corticostriatal-spinal degeneration (HCC)    GERD (gastroesophageal reflux disease)    Low testosterone     Past Surgical History:  Procedure Laterality Date   BACK SURGERY  2006   COLONOSCOPY WITH PROPOFOL N/A 12/04/2018   Procedure: COLONOSCOPY WITH PROPOFOL;  Surgeon: Toney Reil, MD;  Location: [facility] ENDOSCOPY;  Service: Gastroenterology;  Laterality: N/A;   diverticulitis surgery     removed some of lower intestine   ESOPHAGOGASTRODUODENOSCOPY (EGD) WITH PROPOFOL N/A 12/04/2018   Procedure: ESOPHAGOGASTRODUODENOSCOPY (EGD) WITH PROPOFOL;  Surgeon: Toney Reil, MD;  Location: [facility] ENDOSCOPY;  Service: Gastroenterology;  Laterality: N/A;   NECK SURGERY  2015   VASECTOMY     only right side done for nerve pain    Family History  Problem Relation Age of Onset   Leukemia Mother    Healthy Father    Kidney disease Neg Hx    Prostate cancer Neg Hx     Social History   Socioeconomic History   Marital status: Divorced    Spouse name: Not on file   Number of children: Not on file   Years of education: Not on file   Highest education level: Not on file  Occupational History    Not on file  Tobacco Use   Smoking status: Former    Types: Cigarettes    Quit date: 01/22/1987    Years since quitting: 35.4   Smokeless tobacco: Never  Vaping Use   Vaping Use: Never used  Substance and Sexual Activity   Alcohol use: Not Currently    Alcohol/week: 1.0 standard drink of alcohol    Types: 1 Cans of beer per week   Drug use: No   Sexual activity: Yes    Birth control/protection: Condom  Other Topics Concern   Not on file  Social History Narrative   Not on file   Social Determinants of Health   Financial Resource Strain: Not on file  Food Insecurity: Not on file  Transportation Needs: Not on file  Physical Activity: Not on file  Stress: Not on file  Social Connections: Not on file  Intimate Partner Violence: Not on file     Outpatient Medications Prior to Visit  Medication Sig Dispense Refill   ALPRAZolam (XANAX) 0.25 MG tablet Take 0.25 mg by mouth at bedtime as needed for anxiety.     ascorbic acid (VITAMIN C) 100 MG tablet Take 1 tablet by mouth daily.     Baclofen 5 MG TABS  (Patient not taking: Reported on 06/07/2022)  metroNIDAZOLE (METROGEL) 1 % gel Apply topically daily. (Patient not taking: Reported on 06/07/2022) 45 g 1   morphine (MS CONTIN) 30 MG 12 hr tablet Take 1 tablet by mouth 2 (two) times a day.     Multiple Vitamins-Minerals (MENS 50+ MULTI VITAMIN/MIN PO) Take 1 tablet by mouth daily.     tadalafil (CIALIS) 5 MG tablet Take 1 tablet (5 mg total) by mouth daily as needed for erectile dysfunction. 90 tablet 3   Testosterone 20.25 MG/ACT (1.62%) GEL APPLY 4 PUMPS TO THE SKIN ONCE DAILY AS DIRECTED 300 g 5   Vibegron (GEMTESA) 75 MG TABS Take 1 tablet (75 mg total) by mouth daily. 90 tablet 0   VITAMIN D PO Take 1 tablet by mouth 2 (two) times a week.     No facility-administered medications prior to visit.    No Known Allergies  ROS Review of Systems  Constitutional:  Positive for activity change (less active) and fatigue.  HENT:  Negative.    Respiratory:  Negative for chest tightness and shortness of breath.   Cardiovascular: Negative.   Gastrointestinal: Negative.   Genitourinary: Negative.   Musculoskeletal:  Positive for arthralgias.  Neurological: Negative.   Psychiatric/Behavioral: Negative.        Objective:    Physical Exam Constitutional:      Appearance: Normal appearance.  HENT:     Head: Normocephalic.     Right Ear: Tympanic membrane normal.     Left Ear: Tympanic membrane normal.     Nose: Nose normal.     Mouth/Throat:     Mouth: Mucous membranes are moist.     Pharynx: Oropharynx is clear.  Cardiovascular:     Rate and Rhythm: Normal rate and regular rhythm.     Pulses: Normal pulses.     Heart sounds: Normal heart sounds.  Pulmonary:     Effort: Pulmonary effort is normal.     Breath sounds: Normal breath sounds. No stridor. No wheezing.  Abdominal:     General: Abdomen is flat. Bowel sounds are normal.     Palpations: Abdomen is soft.     Tenderness: There is no abdominal tenderness. There is no rebound.  Musculoskeletal:        General: Normal range of motion.  Skin:    General: Skin is warm.  Neurological:     General: No focal deficit present.     Mental Status: He is alert and oriented to person, place, and time. Mental status is at baseline.  Psychiatric:        Mood and Affect: Mood normal.        Behavior: Behavior normal.        Thought Content: Thought content normal.        Judgment: Judgment normal.     BP 98/60   Pulse 69   Temp 98.6 F (37 C)   Ht 6\' 2"  (1.88 m)   Wt 128 lb 12.8 oz (58.4 kg)   SpO2 98%   BMI 16.54 kg/m  Wt Readings from Last 3 Encounters:  06/07/22 129 lb 6.4 oz (58.7 kg)  06/07/22 128 lb 12.8 oz (58.4 kg)  03/13/22 133 lb (60.3 kg)     Health Maintenance  Topic Date Due   DTaP/Tdap/Td (1 - Tdap) Never done   Zoster Vaccines- Shingrix (1 of 2) Never done   COVID-19 Vaccine (2 - Janssen risk series) 06/16/2019   Medicare  Annual Wellness (AWV)  07/03/2019   Pneumonia Vaccine 65+  Years old (1 of 1 - PCV) Never done   INFLUENZA VACCINE  08/22/2022   Colonoscopy  12/03/2028   Hepatitis C Screening  Completed   HIV Screening  Completed   HPV VACCINES  Aged Out    There are no preventive care reminders to display for this patient.  Lab Results  Component Value Date   TSH 0.92 06/07/2022   Lab Results  Component Value Date   WBC 9.3 06/07/2022   HGB 13.2 06/07/2022   HCT 39.5 06/07/2022   MCV 91.3 06/07/2022   PLT 265.0 06/07/2022   Lab Results  Component Value Date   NA 137 06/07/2022   K 4.2 06/07/2022   CO2 32 06/07/2022   GLUCOSE 91 06/07/2022   BUN 17 06/07/2022   CREATININE 0.73 06/07/2022   BILITOT 0.8 06/07/2022   ALKPHOS 49 06/07/2022   AST 19 06/07/2022   ALT 15 06/07/2022   PROT 7.9 06/07/2022   ALBUMIN 4.5 06/07/2022   CALCIUM 9.7 06/07/2022   ANIONGAP 9 01/05/2020   GFR 95.39 06/07/2022   Lab Results  Component Value Date   CHOL 185 06/07/2022   Lab Results  Component Value Date   HDL 62.20 06/07/2022   Lab Results  Component Value Date   LDLCALC 109 (H) 06/07/2022   Lab Results  Component Value Date   TRIG 70.0 06/07/2022   Lab Results  Component Value Date   CHOLHDL 3 06/07/2022   Lab Results  Component Value Date   HGBA1C 5.5 11/25/2014      Assessment & Plan:  Generalized anxiety disorder Assessment & Plan: Patient wants refill on xanax. Labs ordered.      Orders: -     Lipid panel -     Comprehensive metabolic panel -     CBC with Differential/Platelet -     Thyroid Panel With TSH  Abnormal weight loss Assessment & Plan: Labs ordered.   Orders: -     Comprehensive metabolic panel -     CBC with Differential/Platelet -     Thyroid Panel With TSH  Chronic neck and back pain Assessment & Plan: Followed by Dr. Welton Flakes   Elevated LDL cholesterol level Assessment & Plan: Will check lipid panel.   Patchy loss of hair Assessment &  Plan: CMP ordered. Will prescribe finasteride if lab WNL.     Follow-up: Return in about 1 year (around 06/07/2023), or if symptoms worsen or fail to improve, for chronic management.   Kara Dies, NP

## 2022-06-07 NOTE — Patient Instructions (Signed)
Labs ordered.

## 2022-06-07 NOTE — Progress Notes (Unsigned)
Cardiology Office Note:    Date:  06/07/2022   ID:  Larry Lin, DOB October 22, 1955, MRN [id number]  PCP:  Kara Dies, NP   [facility] Providers Cardiologist:  None     Referring MD: No ref. provider found   Chief Complaint  Patient presents with   New Patient (Initial Visit)    Patient concerned with low blood pressure for 2-3 months.  Occasional left side chest pain and dyspnea but relates the chest pain could be related to shoulder pain.    History of Present Illness:    RUDOLFO MCGLOCKLIN is a 67 y.o. male with a hx of low testosterone, degenerative disc disease s/p lumbar spinal surgery who presents due to low blood pressures and shortness of breath.  States noticing low blood pressures over the past month or so.  Systolics at home range in the 80s.  Has occasional dizziness when working in his garden.  Denies syncope.  Stop taking Cialis due to being worried about low pressures.  Has nonspecific shortness of breath.  Past Medical History:  Diagnosis Date   Anemia    Arthritis    Corticostriatal-spinal degeneration (HCC)    GERD (gastroesophageal reflux disease)    Low testosterone     Past Surgical History:  Procedure Laterality Date   BACK SURGERY  2006   COLONOSCOPY WITH PROPOFOL N/A 12/04/2018   Procedure: COLONOSCOPY WITH PROPOFOL;  Surgeon: Toney Reil, MD;  Location: [facility] ENDOSCOPY;  Service: Gastroenterology;  Laterality: N/A;   diverticulitis surgery     removed some of lower intestine   ESOPHAGOGASTRODUODENOSCOPY (EGD) WITH PROPOFOL N/A 12/04/2018   Procedure: ESOPHAGOGASTRODUODENOSCOPY (EGD) WITH PROPOFOL;  Surgeon: Toney Reil, MD;  Location: [facility] ENDOSCOPY;  Service: Gastroenterology;  Laterality: N/A;   NECK SURGERY  2015   VASECTOMY     only right side done for nerve pain    Current Medications: Current Meds  Medication Sig   ALPRAZolam (XANAX) 0.25 MG tablet Take 0.25 mg by mouth at bedtime as needed for  anxiety.   ascorbic acid (VITAMIN C) 100 MG tablet Take 1 tablet by mouth daily.   lidocaine (LIDODERM) 5 % Place 1 patch onto the skin daily.   morphine (MS CONTIN) 30 MG 12 hr tablet Take 1 tablet by mouth 2 (two) times a day.   Multiple Vitamins-Minerals (MENS 50+ MULTI VITAMIN/MIN PO) Take 1 tablet by mouth daily.   tadalafil (CIALIS) 5 MG tablet Take 1 tablet (5 mg total) by mouth daily as needed for erectile dysfunction.   Testosterone 20.25 MG/ACT (1.62%) GEL APPLY 4 PUMPS TO THE SKIN ONCE DAILY AS DIRECTED   Vibegron (GEMTESA) 75 MG TABS Take 1 tablet (75 mg total) by mouth daily.   VITAMIN D PO Take 1 tablet by mouth 2 (two) times a week.     Allergies:   Patient has no known allergies.   Social History   Socioeconomic History   Marital status: Divorced    Spouse name: Not on file   Number of children: Not on file   Years of education: Not on file   Highest education level: Not on file  Occupational History   Not on file  Tobacco Use   Smoking status: Former    Types: Cigarettes    Quit date: 01/22/1987    Years since quitting: 35.3   Smokeless tobacco: Never  Vaping Use   Vaping Use: Never used  Substance and Sexual Activity   Alcohol use: Not Currently  Alcohol/week: 1.0 standard drink of alcohol    Types: 1 Cans of beer per week   Drug use: No   Sexual activity: Yes    Birth control/protection: Condom  Other Topics Concern   Not on file  Social History Narrative   Not on file   Social Determinants of Health   Financial Resource Strain: Not on file  Food Insecurity: Not on file  Transportation Needs: Not on file  Physical Activity: Not on file  Stress: Not on file  Social Connections: Not on file     Family History: The patient's family history includes Healthy in his father; Leukemia in his mother. There is no history of Kidney disease or Prostate cancer.  ROS:   Please see the history of present illness.     All other systems reviewed and are  negative.  EKGs/Labs/Other Studies Reviewed:    The following studies were reviewed today:   EKG:  EKG is  ordered today.  The ekg ordered today demonstrates normal sinus rhythm, possible old anterior infarct.  Recent Labs: 06/07/2022: ALT 15; BUN 17; Creatinine, Ser 0.73; Hemoglobin 13.2; Platelets 265.0; Potassium 4.2; Sodium 137  Recent Lipid Panel    Component Value Date/Time   CHOL 185 06/07/2022 1118   TRIG 70.0 06/07/2022 1118   HDL 62.20 06/07/2022 1118   CHOLHDL 3 06/07/2022 1118   VLDL 14.0 06/07/2022 1118   LDLCALC 109 (H) 06/07/2022 1118   LDLCALC 154 (H) 06/11/2017 1041     Risk Assessment/Calculations:             Physical Exam:    VS:  BP 112/72 (BP Location: Right Arm, Patient Position: Sitting, Cuff Size: Normal)   Pulse 85   Ht 6\' 2"  (1.88 m)   Wt 129 lb 6.4 oz (58.7 kg)   SpO2 97%   BMI 16.61 kg/m     Wt Readings from Last 3 Encounters:  06/07/22 129 lb 6.4 oz (58.7 kg)  06/07/22 128 lb 12.8 oz (58.4 kg)  03/13/22 133 lb (60.3 kg)     GEN: Appears cachectic, no acute distress HEENT: Normal NECK: No JVD; No carotid bruits CARDIAC: RRR, no murmurs, rubs, gallops RESPIRATORY:  Clear to auscultation without rales, wheezing or rhonchi  ABDOMEN: Soft, non-tender, non-distended MUSCULOSKELETAL:  No edema; No deformity  SKIN: Warm and dry NEUROLOGIC:  Alert and oriented x 3 PSYCHIATRIC:  Normal affect   ASSESSMENT:    1. Shortness of breath   2. Hypotension, unspecified hypotension type   3. Abnormal EKG    PLAN:    In order of problems listed above:  Shortness of breath, nonspecific, not consistent with angina obtain echo to rule out any structural abnormalities. Hypertension, blood pressures at room 89, BP appears controlled today.  Adequate hydration advised, okay to try OTC salt tablets 1 tab 3 times daily. Possible old anterior infarct, echocardiogram as above.  Follow-up after echocardiogram.      Medication Adjustments/Labs  and Tests Ordered: Current medicines are reviewed at length with the patient today.  Concerns regarding medicines are outlined above.  No orders of the defined types were placed in this encounter.  No orders of the defined types were placed in this encounter.   There are no Patient Instructions on file for this visit.   Signed, Debbe Odea, MD  06/07/2022 3:35 PM    [facility]

## 2022-06-08 LAB — THYROID PANEL WITH TSH
Free Thyroxine Index: 2.5 (ref 1.4–3.8)
T3 Uptake: 26 % (ref 22–35)
T4, Total: 9.5 ug/dL (ref 4.9–10.5)
TSH: 0.92 mIU/L (ref 0.40–4.50)

## 2022-06-12 ENCOUNTER — Encounter: Payer: Self-pay | Admitting: Nurse Practitioner

## 2022-06-12 ENCOUNTER — Other Ambulatory Visit: Payer: Self-pay | Admitting: Nurse Practitioner

## 2022-06-13 ENCOUNTER — Encounter: Payer: Self-pay | Admitting: Nurse Practitioner

## 2022-06-13 DIAGNOSIS — L659 Nonscarring hair loss, unspecified: Secondary | ICD-10-CM | POA: Insufficient documentation

## 2022-06-13 MED ORDER — ALPRAZOLAM 0.25 MG PO TABS: 0.2500 mg | ORAL_TABLET | Freq: Every evening | ORAL | 3 refills | Status: DC | PRN

## 2022-06-13 MED ORDER — FINASTERIDE 1 MG PO TABS: 1.0000 mg | ORAL_TABLET | Freq: Every day | ORAL | 1 refills | Status: DC

## 2022-06-13 NOTE — Assessment & Plan Note (Signed)
Labs ordered.

## 2022-06-13 NOTE — Assessment & Plan Note (Signed)
CMP ordered. Will prescribe finasteride if lab WNL.

## 2022-06-13 NOTE — Assessment & Plan Note (Signed)
Followed by Dr. Khan 

## 2022-06-13 NOTE — Assessment & Plan Note (Signed)
Will check lipid panel 

## 2022-06-13 NOTE — Assessment & Plan Note (Addendum)
Patient wants refill on xanax. Labs ordered.

## 2022-06-13 NOTE — Telephone Encounter (Signed)
LOV: 06/07/2022

## 2022-07-12 ENCOUNTER — Telehealth: Payer: Self-pay | Admitting: Urology

## 2022-07-12 ENCOUNTER — Telehealth: Payer: Medicare HMO | Admitting: Nurse Practitioner

## 2022-07-12 DIAGNOSIS — R300 Dysuria: Secondary | ICD-10-CM

## 2022-07-12 NOTE — Telephone Encounter (Signed)
I forwarded patient's mychart message to you regarding appt he is requesting. He is wanting to do labs at the appt with [facility]. Is that ok, or should he have labs done first?

## 2022-07-12 NOTE — Telephone Encounter (Signed)
Patient sent a my chart message to schedule an appointment with Carollee Herter for August. He already has a lab appt scheduled for 09/11/22 that was scheduled when he was seen in February. Does he need an office appointment also? Please advise.

## 2022-07-12 NOTE — Progress Notes (Signed)
E-Visit for Urinary Problems ? ?Based on what you shared with me, I feel your condition warrants further evaluation and I recommend that you be seen for a face to face office visit.  Male bladder infections are not very common.  We worry about prostate or kidney conditions.  The standard of care is to examine the abdomen and kidneys, and to do a urine and blood test to make sure that something more serious is not going on.  We recommend that you see a provider today.  If your doctor's office is closed [place] has the following Urgent Cares: ? ?  ?NOTE: You will not be charged for this e-visit. ? ?If you are having a true medical emergency please call 911.   ? ?  ? For an urgent face to face visit, [place] has six urgent care centers for your convenience:  ?  ? [facility] [place] ?Get Driving Directions ?[phone] ?[address] ?[place], [place] [address] ?  ? [facility] ([place]) ?Get Driving Directions ?[phone] ?[address] ?[place], [place] [address] ? ?[facility] ([facility]) ?Get Driving Directions ?[phone] ?[address] ?[place],  [place]  [address] ? ?[facility] ?Get Driving Directions ?[phone] ?[address] ?[place], [place] [address] ?  ?[facility] [place] ?Get Driving Directions  ?[phone] ?[address] ?[address] ?[place], [place] [address] ?  ?[facility] [place] ?Get Driving Directions ?[phone] ?[address] ?[place], [place] [address] ? ?Your MyChart E-visit questionnaire answers were reviewed by a board certified advanced clinical practitioner to complete your personal care plan based on your specific symptoms.  Thank you for using e-Visits. ?

## 2022-07-15 NOTE — Telephone Encounter (Signed)
A mychart message sent to patient. He may have an office visit and add the labs as well. He is to call our office to schedule appointment.

## 2022-07-30 ENCOUNTER — Telehealth: Payer: Self-pay | Admitting: Nurse Practitioner

## 2022-07-30 NOTE — Telephone Encounter (Signed)
Copied from CRM [id number]. Topic: Medicare AWV >> Jul 30, 2022  2:35 PM Payton Doughty wrote: Reason for CRM: N/A 07/30/2022 FOR AWV - MAILBOX FULL  Verlee Rossetti; Care Guide Ambulatory Clinical Support [place] [facility] Direct Dial: [phone]

## 2022-07-31 ENCOUNTER — Other Ambulatory Visit: Payer: Medicare HMO

## 2022-08-12 ENCOUNTER — Ambulatory Visit: Payer: Medicare HMO | Admitting: Urology

## 2022-08-22 ENCOUNTER — Telehealth: Payer: Self-pay | Admitting: Urology

## 2022-08-22 NOTE — Telephone Encounter (Signed)
Patient called to reschedule his appt with Carollee Herter to [facility] office on 08/26/22. He was originally scheduled in [place], and was to have labs same day. He will need order for labs to be done in [place] instead.

## 2022-08-22 NOTE — Addendum Note (Signed)
Addended by: Sueanne Margarita on: 08/22/2022 03:40 PM   Modules accepted: Orders

## 2022-08-23 ENCOUNTER — Other Ambulatory Visit: Payer: Self-pay

## 2022-08-23 DIAGNOSIS — R399 Unspecified symptoms and signs involving the genitourinary system: Secondary | ICD-10-CM

## 2022-08-23 NOTE — Progress Notes (Unsigned)
08/26/2022 7:14 PM   [name] 20-Mar-1955 [id number]  Referring provider: Kara Dies, NP [address] [place],  [place] [address]  Urological history: 1. Testosterone deficiency - contributing factors of age and chronic narcotics - testosterone pending  - HCT, hgb pending  - AndroGel 1.62%, 4 pumps daily   2. ED - contributing factors of age, BPH and testosterone deficiency - tadalafil 5 mg daily   3. BPH with LU TS - PSA pending -prostate volume ~ 40 cc    3. Urgency - Contributing factors of age, BPH, lumbar spine issues, anxiety and history of smoking - Gemtesa 75 mg every three days   4. Nocturia -Risk factors for nocturia: ?obstructive sleep apnea, hypertension, heart disease, anxiety and BPH.    - has not been diagnosed with sleep apnea   No chief complaint on file.   HPI: Larry Lin is a 67 y.o. male who presents today for UTI symptoms and follow up.    Previous records reviewed.   I PSS ***  UA ***  PVR ***   Score:  1-7 Mild 8-19 Moderate 20-35 Severe   SHIM ***    Score: 1-7 Severe ED 8-11 Moderate ED 12-16 Mild-Moderate ED 17-21 Mild ED 22-25 No ED   PMH: Past Medical History:  Diagnosis Date   Anemia    Arthritis    Corticostriatal-spinal degeneration (HCC)    GERD (gastroesophageal reflux disease)    Low testosterone     Surgical History: Past Surgical History:  Procedure Laterality Date   BACK SURGERY  2006   COLONOSCOPY WITH PROPOFOL N/A 12/04/2018   Procedure: COLONOSCOPY WITH PROPOFOL;  Surgeon: Toney Reil, MD;  Location: [facility];  Service: Gastroenterology;  Laterality: N/A;   diverticulitis surgery     removed some of lower intestine   ESOPHAGOGASTRODUODENOSCOPY (EGD) WITH PROPOFOL N/A 12/04/2018   Procedure: ESOPHAGOGASTRODUODENOSCOPY (EGD) WITH PROPOFOL;  Surgeon: Toney Reil, MD;  Location: [facility];  Service: Gastroenterology;  Laterality: N/A;    NECK SURGERY  2015   VASECTOMY     only right side done for nerve pain    Home Medications:  Allergies as of 08/26/2022   No Known Allergies      Medication List        Accurate as of August 23, 2022  7:14 PM. If you have any questions, ask your nurse or doctor.          ALPRAZolam 0.25 MG tablet Commonly known as: XANAX Take 1 tablet (0.25 mg total) by mouth at bedtime as needed for anxiety.   ascorbic acid 100 MG tablet Commonly known as: VITAMIN C Take 1 tablet by mouth daily.   Baclofen 5 MG Tabs   finasteride 1 MG tablet Commonly known as: PROPECIA Take 1 tablet (1 mg total) by mouth daily.   Gemtesa 75 MG Tabs Generic drug: Vibegron Take 1 tablet (75 mg total) by mouth daily.   lidocaine 5 % Commonly known as: LIDODERM Place 1 patch onto the skin daily.   MENS 50+ MULTI VITAMIN/MIN PO Take 1 tablet by mouth daily.   metroNIDAZOLE 1 % gel Commonly known as: Metrogel Apply topically daily.   morphine 30 MG 12 hr tablet Commonly known as: MS CONTIN Take 1 tablet by mouth 2 (two) times a day.   sodium chloride 1 g tablet Take 1 tablet (1 g total) by mouth 3 (three) times daily with meals.   tadalafil 5 MG tablet Commonly  known as: CIALIS Take 1 tablet (5 mg total) by mouth daily as needed for erectile dysfunction.   Testosterone 20.25 MG/ACT (1.62%) Gel APPLY 4 PUMPS TO THE SKIN ONCE DAILY AS DIRECTED   VITAMIN D PO Take 1 tablet by mouth 2 (two) times a week.        Allergies: No Known Allergies  Family History: Family History  Problem Relation Age of Onset   Leukemia Mother    Healthy Father    Kidney disease Neg Hx    Prostate cancer Neg Hx     Social History:  reports that he quit smoking about 35 years ago. His smoking use included cigarettes. He has never used smokeless tobacco. He reports that he does not currently use alcohol after a past usage of about 1.0 standard drink of alcohol per week. He reports that he does not use  drugs.  ROS: Pertinent ROS in HPI  Physical Exam: There were no vitals taken for this visit.  Constitutional:  Well nourished. Alert and oriented, No acute distress. HEENT: [place] AT, moist mucus membranes.  Trachea midline, no masses. Cardiovascular: No clubbing, cyanosis, or edema. Respiratory: Normal respiratory effort, no increased work of breathing. GI: Abdomen is soft, non tender, non distended, no abdominal masses. Liver and spleen not palpable.  No hernias appreciated.  Stool sample for occult testing is not indicated.   GU: No CVA tenderness.  No bladder fullness or masses.  Patient with circumcised/uncircumcised phallus. ***Foreskin easily retracted***  Urethral meatus is patent.  No penile discharge. No penile lesions or rashes. Scrotum without lesions, cysts, rashes and/or edema.  Testicles are located scrotally bilaterally. No masses are appreciated in the testicles. Left and right epididymis are normal. Rectal: Patient with  normal sphincter tone. Anus and perineum without scarring or rashes. No rectal masses are appreciated. Prostate is approximately *** grams, *** nodules are appreciated. Seminal vesicles are normal. Skin: No rashes, bruises or suspicious lesions. Lymph: No cervical or inguinal adenopathy. Neurologic: Grossly intact, no focal deficits, moving all 4 extremities. Psychiatric: Normal mood and affect.  Laboratory Data: Lab Results  Component Value Date   WBC 9.3 06/07/2022   HGB 13.2 06/07/2022   HCT 39.5 06/07/2022   MCV 91.3 06/07/2022   PLT 265.0 06/07/2022    Lab Results  Component Value Date   CREATININE 0.73 06/07/2022    Lab Results  Component Value Date   TSH 0.92 06/07/2022       Component Value Date/Time   CHOL 185 06/07/2022 1118   HDL 62.20 06/07/2022 1118   CHOLHDL 3 06/07/2022 1118   VLDL 14.0 06/07/2022 1118   LDLCALC 109 (H) 06/07/2022 1118   LDLCALC 154 (H) 06/11/2017 1041    Lab Results  Component Value Date   AST 19  06/07/2022   Lab Results  Component Value Date   ALT 15 06/07/2022    Urinalysis *** I have reviewed the labs.   Pertinent Imaging: ***  Assessment & Plan:  ***  1. Suspected UTI -UA *** -urine culture ***  2. Testosterone deficiency  -testosterone levels are therapeutic/subtherapeutic -H & H *** -continue ***  3. BPH with LUTS -PSA stable -DRE benign -UA benign -PVR < 300 cc -symptoms - *** -most bothersome symptoms are *** -continue conservative management, avoiding bladder irritants and timed voiding's -Initiate alpha-blocker (***), discussed side effects *** -Initiate 5 alpha reductase inhibitor (***), discussed side effects *** -Continue tamsulosin 0.4 mg daily, alfuzosin 10 mg daily, Rapaflo 8 mg daily, terazosin,  doxazosin, Cialis 5 mg daily and finasteride 5 mg daily, dutasteride 0.5 mg daily***:refills given -Cannot tolerate medication or medication failure, schedule cystoscopy ***  4. Erectile dysfunction:    - I explained that conditions like diabetes, hypertension, coronary artery disease, peripheral vascular disease, smoking, alcohol consumption, age, sleep apnea and BPH can diminish the ability to have an erection - I explained the ED may be a risk marker for underlying CVD and he should follow up with PCP for further studies *** - A recent study published in Sex Med 2018 Apr 13 revealed moderate to vigorous aerobic exercise for 40 minutes 4 times per week can decrease erectile problems caused by physical inactivity, obesity, hypertension, metabolic syndrome and/or cardiovascular diseases *** - We discussed trying a *** different PDE5 inhibitor, intra-urethral suppositories, intracavernous vasoactive drug injection therapy, vacuum erection devices and penile prosthesis implantation    No follow-ups on file.  These notes generated with voice recognition software. I apologize for typographical errors.  Cloretta Ned  [facility] [address]  [address] [place], [place] [address] [phone]

## 2022-08-26 ENCOUNTER — Ambulatory Visit: Payer: Medicare HMO | Admitting: Urology

## 2022-08-26 ENCOUNTER — Encounter: Payer: Self-pay | Admitting: Urology

## 2022-08-26 ENCOUNTER — Other Ambulatory Visit
Admission: RE | Admit: 2022-08-26 | Discharge: 2022-08-26 | Disposition: A | Discharge status: Home / Self Care | Payer: Medicare HMO | Attending: Urology | Admitting: Urology

## 2022-08-26 VITALS — BP 107/73 | HR 81 | Ht 74.0 in | Wt 132.6 lb

## 2022-08-26 DIAGNOSIS — N401 Enlarged prostate with lower urinary tract symptoms: Secondary | ICD-10-CM | POA: Diagnosis not present

## 2022-08-26 DIAGNOSIS — N138 Other obstructive and reflux uropathy: Secondary | ICD-10-CM | POA: Diagnosis not present

## 2022-08-26 DIAGNOSIS — Z981 Arthrodesis status: Secondary | ICD-10-CM | POA: Diagnosis not present

## 2022-08-26 DIAGNOSIS — R300 Dysuria: Secondary | ICD-10-CM

## 2022-08-26 DIAGNOSIS — M542 Cervicalgia: Secondary | ICD-10-CM | POA: Diagnosis not present

## 2022-08-26 DIAGNOSIS — E349 Endocrine disorder, unspecified: Secondary | ICD-10-CM | POA: Insufficient documentation

## 2022-08-26 DIAGNOSIS — R399 Unspecified symptoms and signs involving the genitourinary system: Secondary | ICD-10-CM | POA: Insufficient documentation

## 2022-08-26 DIAGNOSIS — E291 Testicular hypofunction: Secondary | ICD-10-CM

## 2022-08-26 DIAGNOSIS — R3989 Other symptoms and signs involving the genitourinary system: Secondary | ICD-10-CM

## 2022-08-26 DIAGNOSIS — N529 Male erectile dysfunction, unspecified: Secondary | ICD-10-CM | POA: Diagnosis not present

## 2022-08-26 DIAGNOSIS — G959 Disease of spinal cord, unspecified: Secondary | ICD-10-CM | POA: Diagnosis not present

## 2022-08-26 DIAGNOSIS — M47812 Spondylosis without myelopathy or radiculopathy, cervical region: Secondary | ICD-10-CM | POA: Diagnosis not present

## 2022-08-26 LAB — URINALYSIS, COMPLETE (UACMP) WITH MICROSCOPIC
Bilirubin Urine: NEGATIVE
Glucose, UA: NEGATIVE mg/dL
Hgb urine dipstick: NEGATIVE
Ketones, ur: NEGATIVE mg/dL
Nitrite: NEGATIVE
Protein, ur: NEGATIVE mg/dL
RBC / HPF: NONE SEEN RBC/hpf (ref 0–5)
Specific Gravity, Urine: 1.01 (ref 1.005–1.030)
Squamous Epithelial / HPF: NONE SEEN /HPF (ref 0–5)
pH: 5.5 (ref 5.0–8.0)

## 2022-08-26 LAB — HEMOGLOBIN AND HEMATOCRIT, BLOOD
HCT: 37.5 % — ABNORMAL LOW (ref 39.0–52.0)
Hemoglobin: 12.6 g/dL — ABNORMAL LOW (ref 13.0–17.0)

## 2022-08-26 LAB — BLADDER SCAN AMB NON-IMAGING

## 2022-08-26 LAB — PSA: Prostatic Specific Antigen: 0.45 ng/mL (ref 0.00–4.00)

## 2022-08-27 ENCOUNTER — Ambulatory Visit: Payer: Medicare HMO | Admitting: Urology

## 2022-08-30 ENCOUNTER — Other Ambulatory Visit: Payer: Self-pay

## 2022-08-30 ENCOUNTER — Encounter: Payer: Self-pay | Admitting: Nurse Practitioner

## 2022-08-30 MED ORDER — FINASTERIDE 1 MG PO TABS: 1.0000 mg | ORAL_TABLET | Freq: Every day | ORAL | 1 refills | Status: DC

## 2022-09-03 ENCOUNTER — Ambulatory Visit: Payer: Medicare HMO | Admitting: Urology

## 2022-09-03 ENCOUNTER — Other Ambulatory Visit: Payer: Medicare HMO

## 2022-09-03 ENCOUNTER — Ambulatory Visit: Payer: Medicare HMO

## 2022-09-03 DIAGNOSIS — R0602 Shortness of breath: Secondary | ICD-10-CM

## 2022-09-03 LAB — ECHOCARDIOGRAM COMPLETE
AR max vel: 3.2 cm2
AV Area VTI: 3.23 cm2
AV Area mean vel: 3.12 cm2
AV Mean grad: 2 mmHg
AV Peak grad: 3.9 mmHg
Ao pk vel: 0.99 m/s
Area-P 1/2: 4.8 cm2
S' Lateral: 3.4 cm

## 2022-09-03 MED ORDER — PERFLUTREN LIPID MICROSPHERE
1.0000 mL | INTRAVENOUS | Status: AC | PRN
Start: 2022-09-03 — End: 2022-09-03
  Administered 2022-09-03: 2 mL via INTRAVENOUS

## 2022-09-04 ENCOUNTER — Ambulatory Visit: Payer: Medicare HMO | Admitting: Urology

## 2022-09-04 ENCOUNTER — Other Ambulatory Visit: Payer: Medicare HMO | Admitting: Urology

## 2022-09-04 ENCOUNTER — Encounter: Payer: Self-pay | Admitting: Urology

## 2022-09-04 VITALS — BP 112/76 | HR 101 | Ht 74.0 in | Wt 129.0 lb

## 2022-09-04 DIAGNOSIS — R300 Dysuria: Secondary | ICD-10-CM

## 2022-09-04 DIAGNOSIS — M5416 Radiculopathy, lumbar region: Secondary | ICD-10-CM | POA: Diagnosis not present

## 2022-09-04 DIAGNOSIS — M5001 Cervical disc disorder with myelopathy,  high cervical region: Secondary | ICD-10-CM | POA: Diagnosis not present

## 2022-09-04 DIAGNOSIS — K219 Gastro-esophageal reflux disease without esophagitis: Secondary | ICD-10-CM | POA: Diagnosis not present

## 2022-09-04 DIAGNOSIS — M1990 Unspecified osteoarthritis, unspecified site: Secondary | ICD-10-CM | POA: Diagnosis not present

## 2022-09-04 DIAGNOSIS — R399 Unspecified symptoms and signs involving the genitourinary system: Secondary | ICD-10-CM

## 2022-09-04 DIAGNOSIS — Z79891 Long term (current) use of opiate analgesic: Secondary | ICD-10-CM | POA: Diagnosis not present

## 2022-09-04 DIAGNOSIS — R3915 Urgency of urination: Secondary | ICD-10-CM

## 2022-09-04 DIAGNOSIS — G894 Chronic pain syndrome: Secondary | ICD-10-CM | POA: Diagnosis not present

## 2022-09-04 DIAGNOSIS — M545 Low back pain, unspecified: Secondary | ICD-10-CM | POA: Diagnosis not present

## 2022-09-04 DIAGNOSIS — M5011 Cervical disc disorder with radiculopathy,  high cervical region: Secondary | ICD-10-CM | POA: Diagnosis not present

## 2022-09-04 DIAGNOSIS — M62830 Muscle spasm of back: Secondary | ICD-10-CM | POA: Diagnosis not present

## 2022-09-04 DIAGNOSIS — M5412 Radiculopathy, cervical region: Secondary | ICD-10-CM | POA: Diagnosis not present

## 2022-09-04 MED ORDER — SULFAMETHOXAZOLE-TRIMETHOPRIM 800-160 MG PO TABS: 1.0000 | ORAL_TABLET | Freq: Two times a day (BID) | ORAL | 0 refills | Status: AC

## 2022-09-04 MED ORDER — SULFAMETHOXAZOLE-TRIMETHOPRIM 800-160 MG PO TABS: 1.0000 | ORAL_TABLET | Freq: Two times a day (BID) | ORAL | 0 refills | Status: DC

## 2022-09-04 NOTE — Patient Instructions (Signed)

## 2022-09-04 NOTE — Progress Notes (Signed)
Cystoscopy Procedure Note:  Indication: Lower urinary tract symptoms, urgency, dysuria  After informed consent and discussion of the procedure and its risks, Larry Lin was positioned and prepped in the standard fashion. Cystoscopy was performed with a flexible cystoscope. The urethra, bladder neck and entire bladder was visualized in a standard fashion. The prostate was moderate in size. The ureteral orifices were visualized in their normal location and orientation.  No abnormalities on retroflexion  Findings: Normal cystoscopy  Assessment and Plan: Normal cystoscopy, his symptoms have improved with courses of antibiotics previously then returned after discontinuing antibiotics.  Symptoms may be related to prostatitis and I recommended a 4-week course of Bactrim  RTC PA 6 weeks symptom check  Legrand Rams, MD 09/04/2022

## 2022-09-11 ENCOUNTER — Other Ambulatory Visit: Payer: Medicare HMO

## 2022-09-20 ENCOUNTER — Other Ambulatory Visit: Payer: Self-pay | Admitting: *Deleted

## 2022-09-20 DIAGNOSIS — N138 Other obstructive and reflux uropathy: Secondary | ICD-10-CM

## 2022-09-20 MED ORDER — GEMTESA 75 MG PO TABS: 1.0000 | ORAL_TABLET | Freq: Every day | ORAL | 3 refills | Status: DC

## 2022-10-16 ENCOUNTER — Ambulatory Visit: Payer: Medicare HMO | Admitting: Urology

## 2022-10-21 ENCOUNTER — Other Ambulatory Visit: Payer: Self-pay | Admitting: Urology

## 2022-10-21 DIAGNOSIS — E349 Endocrine disorder, unspecified: Secondary | ICD-10-CM

## 2022-10-21 MED ORDER — TESTOSTERONE 20.25 MG/ACT (1.62%) TD GEL: TRANSDERMAL | 5 refills | Status: DC

## 2022-10-29 ENCOUNTER — Encounter: Payer: Self-pay | Admitting: Nurse Practitioner

## 2022-10-29 MED ORDER — FINASTERIDE 1 MG PO TABS: 1.0000 mg | ORAL_TABLET | Freq: Every day | ORAL | 2 refills | Status: DC

## 2022-11-04 DIAGNOSIS — G894 Chronic pain syndrome: Secondary | ICD-10-CM | POA: Diagnosis not present

## 2022-11-04 DIAGNOSIS — M1990 Unspecified osteoarthritis, unspecified site: Secondary | ICD-10-CM | POA: Diagnosis not present

## 2022-11-04 DIAGNOSIS — M5011 Cervical disc disorder with radiculopathy,  high cervical region: Secondary | ICD-10-CM | POA: Diagnosis not present

## 2022-11-04 DIAGNOSIS — K219 Gastro-esophageal reflux disease without esophagitis: Secondary | ICD-10-CM | POA: Diagnosis not present

## 2022-11-04 DIAGNOSIS — M5416 Radiculopathy, lumbar region: Secondary | ICD-10-CM | POA: Diagnosis not present

## 2022-11-04 DIAGNOSIS — M545 Low back pain, unspecified: Secondary | ICD-10-CM | POA: Diagnosis not present

## 2022-11-04 DIAGNOSIS — M62830 Muscle spasm of back: Secondary | ICD-10-CM | POA: Diagnosis not present

## 2022-11-04 DIAGNOSIS — M5412 Radiculopathy, cervical region: Secondary | ICD-10-CM | POA: Diagnosis not present

## 2022-11-04 DIAGNOSIS — M5001 Cervical disc disorder with myelopathy,  high cervical region: Secondary | ICD-10-CM | POA: Diagnosis not present

## 2022-11-15 NOTE — Progress Notes (Deleted)
11/20/2022 10:17 AM   Larry Lin [birth year] [id number]  Referring provider: Kara Dies, NP [address] [place],  [place] [address]  Urological history: 1. Testosterone deficiency - contributing factors of age and chronic narcotics - testosterone (08/2022) 978 - HCT/hgb pending (08/2022) 12.6/37.5 - AndroGel 1.62%, 4 pumps daily   2. ED - contributing factors of age, BPH and testosterone deficiency - tadalafil 5 mg daily   3. BPH with LU TS - PSA (08/2022) 0.45 -prostate volume ~ 40 cc  -cysto (08/2022) - NED    3. Urgency - Contributing factors of age, BPH, lumbar spine issues, anxiety and history of smoking - Gemtesa 75 mg every three days   4. Nocturia -Risk factors for nocturia: ?obstructive sleep apnea, hypertension, heart disease, anxiety and BPH.    - has not been diagnosed with sleep apnea   No chief complaint on file.  HPI: Larry Lin is a 67 y.o. male who presents today for 6 weeks follow up.   Previous records reviewed.   At his visit on 08/26/2022, since the end of April, early May, he has been experiencing cloudy urine, malodorous urine, urgency and dysuria.  He had doxycyline on hand, so when the symptoms first appeared, he took a ten day course of the antibiotic and his symptoms abated for one week.  Then he took seven days of doxycyline and his symptoms abated for about three days.  He is now taking AZO for relief.  He has not been sexually active for the last several months.  Patient denies any modifying or aggravating factors.  Patient denies any recent UTI's, gross hematuria or suprapubic/flank pain.  Patient denies any fevers, chills, nausea or vomiting.    He underwent a cystoscopy with Dr. Richardo Hanks on September 04, 2022 and it was negative.  He was placed on a course of Bactrim and presents today for follow-up.  I PSS ***        Score:  1-7 Mild 8-19 Moderate 20-35 Severe    PMH: Past Medical History:  Diagnosis Date    Anemia    Arthritis    Corticostriatal-spinal degeneration (HCC)    GERD (gastroesophageal reflux disease)    Low testosterone     Surgical History: Past Surgical History:  Procedure Laterality Date   BACK SURGERY  2006   COLONOSCOPY WITH PROPOFOL N/A 12/04/2018   Procedure: COLONOSCOPY WITH PROPOFOL;  Surgeon: Toney Reil, MD;  Location: [facility];  Service: Gastroenterology;  Laterality: N/A;   diverticulitis surgery     removed some of lower intestine   ESOPHAGOGASTRODUODENOSCOPY (EGD) WITH PROPOFOL N/A 12/04/2018   Procedure: ESOPHAGOGASTRODUODENOSCOPY (EGD) WITH PROPOFOL;  Surgeon: Toney Reil, MD;  Location: [facility];  Service: Gastroenterology;  Laterality: N/A;   NECK SURGERY  2015   VASECTOMY     only right side done for nerve pain    Home Medications:  Allergies as of 11/20/2022   No Known Allergies      Medication List        Accurate as of November 15, 2022 10:17 AM. If you have any questions, ask your nurse or doctor.          ALPRAZolam 0.25 MG tablet Commonly known as: XANAX Take 1 tablet (0.25 mg total) by mouth at bedtime as needed for anxiety.   ascorbic acid 100 MG tablet Commonly known as: VITAMIN C Take 1 tablet by mouth daily.   finasteride 1 MG tablet Commonly known as: PROPECIA Take  1 tablet (1 mg total) by mouth daily.   Gemtesa 75 MG Tabs Generic drug: Vibegron Take 1 tablet (75 mg total) by mouth daily.   lidocaine 5 % Commonly known as: LIDODERM Place 1 patch onto the skin daily.   MENS 50+ MULTI VITAMIN/MIN PO Take 1 tablet by mouth daily.   metroNIDAZOLE 1 % gel Commonly known as: Metrogel Apply topically daily.   morphine 30 MG 12 hr tablet Commonly known as: MS CONTIN Take 1 tablet by mouth 2 (two) times a day.   sodium chloride 1 g tablet Take 1 tablet (1 g total) by mouth 3 (three) times daily with meals.   tadalafil 5 MG tablet Commonly known as: CIALIS Take 1 tablet (5 mg total)  by mouth daily as needed for erectile dysfunction.   Testosterone 20.25 MG/ACT (1.62%) Gel APPLY 4 PUMPS TO THE SKIN ONCE DAILY AS DIRECTED   VITAMIN D PO Take 1 tablet by mouth 2 (two) times a week.        Allergies: No Known Allergies  Family History: Family History  Problem Relation Age of Onset   Leukemia Mother    Healthy Father    Kidney disease Neg Hx    Prostate cancer Neg Hx     Social History:  reports that he quit smoking about 35 years ago. His smoking use included cigarettes. He has been exposed to tobacco smoke. He has never used smokeless tobacco. He reports that he does not currently use alcohol after a past usage of about 1.0 standard drink of alcohol per week. He reports that he does not use drugs.  ROS: Pertinent ROS in HPI  Physical Exam: There were no vitals taken for this visit.  Constitutional:  Well nourished. Alert and oriented, No acute distress. HEENT: [place] AT, moist mucus membranes.  Trachea midline, no masses. Cardiovascular: No clubbing, cyanosis, or edema. Respiratory: Normal respiratory effort, no increased work of breathing. GI: Abdomen is soft, non tender, non distended, no abdominal masses. Liver and spleen not palpable.  No hernias appreciated.  Stool sample for occult testing is not indicated.   GU: No CVA tenderness.  No bladder fullness or masses.  Patient with circumcised/uncircumcised phallus. ***Foreskin easily retracted***  Urethral meatus is patent.  No penile discharge. No penile lesions or rashes. Scrotum without lesions, cysts, rashes and/or edema.  Testicles are located scrotally bilaterally. No masses are appreciated in the testicles. Left and right epididymis are normal. Rectal: Patient with  normal sphincter tone. Anus and perineum without scarring or rashes. No rectal masses are appreciated. Prostate is approximately *** grams, *** nodules are appreciated. Seminal vesicles are normal. Skin: No rashes, bruises or suspicious  lesions. Lymph: No cervical or inguinal adenopathy. Neurologic: Grossly intact, no focal deficits, moving all 4 extremities. Psychiatric: Normal mood and affect.   Laboratory Data: N/A  Pertinent Imaging: N/A  Assessment & Plan:    1. Dysuria ***  2. Testosterone deficiency  -testosterone levels are therapeutic -H & H anemic -continue AndroGel, 4 pumps daily   3. BPH with LUTS -PSA stable -continue conservative management, avoiding bladder irritants and timed voiding's -Continue Cialis 5 mg daily  4. Erectile dysfunction:    - continue tadalafil 5 mg daily  No follow-ups on file.  These notes generated with voice recognition software. I apologize for typographical errors.  Cloretta Ned  [facility] [address]  [address] [place], [place] [address] [phone]

## 2022-11-20 ENCOUNTER — Ambulatory Visit: Payer: Medicare HMO | Admitting: Urology

## 2022-11-20 DIAGNOSIS — N138 Other obstructive and reflux uropathy: Secondary | ICD-10-CM

## 2022-11-20 DIAGNOSIS — N529 Male erectile dysfunction, unspecified: Secondary | ICD-10-CM

## 2022-11-20 DIAGNOSIS — E291 Testicular hypofunction: Secondary | ICD-10-CM

## 2022-11-20 DIAGNOSIS — R300 Dysuria: Secondary | ICD-10-CM

## 2022-12-05 NOTE — Progress Notes (Signed)
12/09/2022 3:19 PM   Larry Lin Jun 08, 1955 [id number]  Referring provider: Kara Dies, NP [address] [place],  [place] [address]  Urological history: 1. Testosterone deficiency - contributing factors of age and chronic narcotics - testosterone (08/2022) 978 - HCT/hgb pending (08/2022) 12.6/37.5 - AndroGel 1.62%, 4 pumps daily   2. ED - contributing factors of age, BPH and testosterone deficiency - tadalafil 5 mg daily   3. BPH with LU TS - PSA (08/2022) 0.45 -prostate volume ~ 40 cc  -cysto (08/2022) - NED    3. Urgency - Contributing factors of age, BPH, lumbar spine issues, anxiety and history of smoking - Gemtesa 75 mg every three days   4. Nocturia -Risk factors for nocturia: ?obstructive sleep apnea, hypertension, heart disease, anxiety and BPH.    - has not been diagnosed with sleep apnea   Chief Complaint  Patient presents with   Urinary Tract Infection   Hypogonadism   HPI: Larry Lin is a 67 y.o. male who presents today for 6 weeks follow up.   Previous records reviewed.   At his visit on 08/26/2022, since the end of April, early May, he has been experiencing cloudy urine, malodorous urine, urgency and dysuria.  He had doxycyline on hand, so when the symptoms first appeared, he took a ten day course of the antibiotic and his symptoms abated for one week.  Then he took seven days of doxycyline and his symptoms abated for about three days.  He is now taking AZO for relief.  He has not been sexually active for the last several months.  Patient denies any modifying or aggravating factors.  Patient denies any recent UTI's, gross hematuria or suprapubic/flank pain.  Patient denies any fevers, chills, nausea or vomiting.    He underwent a cystoscopy with Dr. Richardo Hanks on September 04, 2022 and it was negative.  He was placed on a course of Bactrim and presents today for follow-up.  He was unable to complete the course of the Septra DS as he states it  made him feel spacey like he could not concentrate and felt lightheaded.  He states his symptoms will abate while he takes the antibiotic, but when he stops the antibiotic they returned.  He believes the same thing happened to him about 20 years ago with Cipro.  He states he has a difficult time taking antibiotics for long periods of time.  Patient denies any modifying or aggravating factors.  Patient denies any recent UTI's, gross hematuria or suprapubic/flank pain.  Patient denies any fevers, chills, nausea or vomiting.     PMH: Past Medical History:  Diagnosis Date   Anemia    Arthritis    Corticostriatal-spinal degeneration (HCC)    GERD (gastroesophageal reflux disease)    Low testosterone     Surgical History: Past Surgical History:  Procedure Laterality Date   BACK SURGERY  2006   COLONOSCOPY WITH PROPOFOL N/A 12/04/2018   Procedure: COLONOSCOPY WITH PROPOFOL;  Surgeon: Toney Reil, MD;  Location: [facility];  Service: Gastroenterology;  Laterality: N/A;   diverticulitis surgery     removed some of lower intestine   ESOPHAGOGASTRODUODENOSCOPY (EGD) WITH PROPOFOL N/A 12/04/2018   Procedure: ESOPHAGOGASTRODUODENOSCOPY (EGD) WITH PROPOFOL;  Surgeon: Toney Reil, MD;  Location: [facility];  Service: Gastroenterology;  Laterality: N/A;   NECK SURGERY  2015   VASECTOMY     only right side done for nerve pain    Home Medications:  Allergies as of  12/09/2022       Reactions   Septra [sulfamethoxazole-trimethoprim] Other (See Comments)   Felt "spacey" like he couldn't concentrate while taking the antibiotic         Medication List        Accurate as of December 09, 2022  3:19 PM. If you have any questions, ask your nurse or doctor.          ALPRAZolam 0.25 MG tablet Commonly known as: XANAX Take 1 tablet (0.25 mg total) by mouth at bedtime as needed for anxiety.   ascorbic acid 100 MG tablet Commonly known as: VITAMIN C Take 1 tablet by  mouth daily.   cephALEXin 250 MG capsule Commonly known as: KEFLEX Take one tablet daily Started by: Khoa Opdahl   finasteride 1 MG tablet Commonly known as: PROPECIA Take 1 tablet (1 mg total) by mouth daily.   Gemtesa 75 MG Tabs Generic drug: Vibegron Take 1 tablet (75 mg total) by mouth daily.   lidocaine 5 % Commonly known as: LIDODERM Place 1 patch onto the skin daily.   MENS 50+ MULTI VITAMIN/MIN PO Take 1 tablet by mouth daily.   metroNIDAZOLE 1 % gel Commonly known as: Metrogel Apply topically daily.   morphine 30 MG 12 hr tablet Commonly known as: MS CONTIN Take 1 tablet by mouth 2 (two) times a day.   sodium chloride 1 g tablet Take 1 tablet (1 g total) by mouth 3 (three) times daily with meals.   tadalafil 5 MG tablet Commonly known as: CIALIS Take 1 tablet (5 mg total) by mouth daily as needed for erectile dysfunction.   Testosterone 20.25 MG/ACT (1.62%) Gel APPLY 4 PUMPS TO THE SKIN ONCE DAILY AS DIRECTED   VITAMIN D PO Take 1 tablet by mouth 2 (two) times a week.        Allergies:  Allergies  Allergen Reactions   Septra [Sulfamethoxazole-Trimethoprim] Other (See Comments)    Felt "spacey" like he couldn't concentrate while taking the antibiotic     Family History: Family History  Problem Relation Age of Onset   Leukemia Mother    Healthy Father    Kidney disease Neg Hx    Prostate cancer Neg Hx     Social History:  reports that he quit smoking about 35 years ago. His smoking use included cigarettes. He has been exposed to tobacco smoke. He has never used smokeless tobacco. He reports that he does not currently use alcohol after a past usage of about 1.0 standard drink of alcohol per week. He reports that he does not use drugs.  ROS: Pertinent ROS in HPI  Physical Exam: BP 117/73 (BP Location: Left Arm, Patient Position: Sitting, Cuff Size: Normal)   Pulse 73   Ht 6\' 2"  (1.88 m)   Wt 130 lb (59 kg)   BMI 16.69 kg/m    Constitutional:  Well nourished. Alert and oriented, No acute distress. HEENT: [place] AT, moist mucus membranes.  Trachea midline Cardiovascular: No clubbing, cyanosis, or edema. Respiratory: Normal respiratory effort, no increased work of breathing. Neurologic: Grossly intact, no focal deficits, moving all 4 extremities. Psychiatric: Normal mood and affect.   Laboratory Data: N/A  Pertinent Imaging: N/A  Assessment & Plan:    1. Dysuria/Prostatitis  -He was unable to complete 6 weeks of Septra DS due to intolerable side effects -We discussed taking a low-dose antibiotic daily for 3 months instead -I explained to him that the prostate is not very well penetrated with antibiotics and  that is why we need to be on antibiotics for a long period of time to treat any infection that may be in the prostate -He will do Keflex to 50 mg daily for 3 months  2. Testosterone deficiency  -testosterone levels are therapeutic -H & H anemic -continue AndroGel, 4 pumps daily   3. BPH with LUTS -PSA stable -continue conservative management, avoiding bladder irritants and timed voiding's -Continue Cialis 5 mg daily  4. Erectile dysfunction:    - continue tadalafil 5 mg daily  Return in about 3 months (around 03/11/2023) for symptom recheck .  These notes generated with voice recognition software. I apologize for typographical errors.  Cloretta Ned  [facility] [address]  [address] [place], [place] [address] [phone]

## 2022-12-09 ENCOUNTER — Ambulatory Visit: Payer: Medicare HMO | Attending: Cardiology | Admitting: Cardiology

## 2022-12-09 ENCOUNTER — Ambulatory Visit: Payer: Medicare HMO | Admitting: Urology

## 2022-12-09 ENCOUNTER — Encounter: Payer: Self-pay | Admitting: Cardiology

## 2022-12-09 ENCOUNTER — Encounter: Payer: Self-pay | Admitting: Urology

## 2022-12-09 VITALS — BP 117/73 | HR 73 | Ht 74.0 in | Wt 130.0 lb

## 2022-12-09 VITALS — BP 114/74 | HR 66 | Ht 74.0 in | Wt 130.2 lb

## 2022-12-09 DIAGNOSIS — R0602 Shortness of breath: Secondary | ICD-10-CM

## 2022-12-09 DIAGNOSIS — N419 Inflammatory disease of prostate, unspecified: Secondary | ICD-10-CM

## 2022-12-09 DIAGNOSIS — I959 Hypotension, unspecified: Secondary | ICD-10-CM | POA: Diagnosis not present

## 2022-12-09 DIAGNOSIS — N401 Enlarged prostate with lower urinary tract symptoms: Secondary | ICD-10-CM

## 2022-12-09 MED ORDER — CEPHALEXIN 250 MG PO CAPS
ORAL_CAPSULE | ORAL | 0 refills | Status: DC
Start: 2022-12-09 — End: 2023-04-21

## 2022-12-09 NOTE — Patient Instructions (Signed)
Medication Instructions:   Your physician recommends that you continue on your current medications as directed. Please refer to the Current Medication list given to you today.  *If you need a refill on your cardiac medications before your next appointment, please call your pharmacy*   Lab Work:  None Ordered  If you have labs (blood work) drawn today and your tests are completely normal, you will receive your results only by: MyChart Message (if you have MyChart) OR A paper copy in the mail If you have any lab test that is abnormal or we need to change your treatment, we will call you to review the results.   Testing/Procedures:  None Ordered   Follow-Up: At [facility], you and your health needs are our priority.  As part of our continuing mission to provide you with exceptional heart care, we have created designated Provider Care Teams.  These Care Teams include your primary Cardiologist (physician) and Advanced Practice Providers (APPs -  Physician Assistants and Nurse Practitioners) who all work together to provide you with the care you need, when you need it.  We recommend signing up for the patient portal called "MyChart".  Sign up information is provided on this After Visit Summary.  MyChart is used to connect with patients for Virtual Visits (Telemedicine).  Patients are able to view lab/test results, encounter notes, upcoming appointments, etc.  Non-urgent messages can be sent to your provider as well.   To learn more about what you can do with MyChart, go to [email or web address].    Your next appointment:    As needed 

## 2022-12-09 NOTE — Progress Notes (Signed)
Cardiology Office Note:    Date:  12/09/2022   ID:  Larry Lin, DOB [birth year], MRN [id number]  PCP:  Kara Dies, NP   [facility] Providers Cardiologist:  Debbe Odea, MD     Referring MD: Kara Dies, NP   Chief Complaint  Patient presents with   Follow-up    Patient denies new or acute cardiac problems/concerns today.      History of Present Illness:    Larry Lin is a 67 y.o. male with a hx of low testosterone, degenerative disc disease s/p lumbar spinal surgery who presents for follow-up.  Previously seen due to low blood pressures and shortness of breath.  Echocardiogram was obtained to evaluate any significant abnormalities.  Advised to take over-the-counter salt tablets to help with hypotension.  Denies syncope.  Blood pressures have improved since taking salt tablets.  Overall doing okay, no new concerns.   Past Medical History:  Diagnosis Date   Anemia    Arthritis    Corticostriatal-spinal degeneration (HCC)    GERD (gastroesophageal reflux disease)    Low testosterone     Past Surgical History:  Procedure Laterality Date   BACK SURGERY  2006   COLONOSCOPY WITH PROPOFOL N/A 12/04/2018   Procedure: COLONOSCOPY WITH PROPOFOL;  Surgeon: Toney Reil, MD;  Location: [facility] ENDOSCOPY;  Service: Gastroenterology;  Laterality: N/A;   diverticulitis surgery     removed some of lower intestine   ESOPHAGOGASTRODUODENOSCOPY (EGD) WITH PROPOFOL N/A 12/04/2018   Procedure: ESOPHAGOGASTRODUODENOSCOPY (EGD) WITH PROPOFOL;  Surgeon: Toney Reil, MD;  Location: [facility] ENDOSCOPY;  Service: Gastroenterology;  Laterality: N/A;   NECK SURGERY  2015   VASECTOMY     only right side done for nerve pain    Current Medications: Current Meds  Medication Sig   ALPRAZolam (XANAX) 0.25 MG tablet Take 1 tablet (0.25 mg total) by mouth at bedtime as needed for anxiety.   ascorbic acid (VITAMIN C) 100 MG tablet Take 1 tablet by  mouth daily.   finasteride (PROPECIA) 1 MG tablet Take 1 tablet (1 mg total) by mouth daily.   lidocaine (LIDODERM) 5 % Place 1 patch onto the skin daily.   metroNIDAZOLE (METROGEL) 1 % gel Apply topically daily.   morphine (MS CONTIN) 30 MG 12 hr tablet Take 1 tablet by mouth 2 (two) times a day.   Multiple Vitamins-Minerals (MENS 50+ MULTI VITAMIN/MIN PO) Take 1 tablet by mouth daily.   sodium chloride 1 g tablet Take 1 tablet (1 g total) by mouth 3 (three) times daily with meals.   tadalafil (CIALIS) 5 MG tablet Take 1 tablet (5 mg total) by mouth daily as needed for erectile dysfunction.   Testosterone 20.25 MG/ACT (1.62%) GEL APPLY 4 PUMPS TO THE SKIN ONCE DAILY AS DIRECTED   Vibegron (GEMTESA) 75 MG TABS Take 1 tablet (75 mg total) by mouth daily.   VITAMIN D PO Take 1 tablet by mouth 2 (two) times a week.     Allergies:   Patient has no known allergies.   Social History   Socioeconomic History   Marital status: Divorced    Spouse name: Not on file   Number of children: Not on file   Years of education: Not on file   Highest education level: Not on file  Occupational History   Not on file  Tobacco Use   Smoking status: Former    Current packs/day: 0.00    Types: Cigarettes    Quit date: 01/22/1987  Years since quitting: 35.9    Passive exposure: Past   Smokeless tobacco: Never  Vaping Use   Vaping status: Never Used  Substance and Sexual Activity   Alcohol use: Not Currently    Alcohol/week: 1.0 standard drink of alcohol    Types: 1 Cans of beer per week   Drug use: No   Sexual activity: Yes    Birth control/protection: Condom  Other Topics Concern   Not on file  Social History Narrative   Not on file   Social Determinants of Health   Financial Resource Strain: Not on file  Food Insecurity: Not on file  Transportation Needs: Not on file  Physical Activity: Not on file  Stress: Not on file  Social Connections: Not on file     Family History: The  patient's family history includes Healthy in his father; Leukemia in his mother. There is no history of Kidney disease or Prostate cancer.  ROS:   Please see the history of present illness.     All other systems reviewed and are negative.  EKGs/Labs/Other Studies Reviewed:    The following studies were reviewed today:   EKG:  EKG not ordered today.   Recent Labs: 06/07/2022: ALT 15; BUN 17; Creatinine, Ser 0.73; Platelets 265.0; Potassium 4.2; Sodium 137; TSH 0.92 08/26/2022: Hemoglobin 12.6  Recent Lipid Panel    Component Value Date/Time   CHOL 185 06/07/2022 1118   TRIG 70.0 06/07/2022 1118   HDL 62.20 06/07/2022 1118   CHOLHDL 3 06/07/2022 1118   VLDL 14.0 06/07/2022 1118   LDLCALC 109 (H) 06/07/2022 1118   LDLCALC 154 (H) 06/11/2017 1041     Risk Assessment/Calculations:             Physical Exam:    VS:  BP 114/74 (BP Location: Left Arm, Patient Position: Sitting, Cuff Size: Normal)   Pulse 66   Ht 6\' 2"  (1.88 m)   Wt 130 lb 3.2 oz (59.1 kg)   SpO2 99%   BMI 16.72 kg/m     Wt Readings from Last 3 Encounters:  12/09/22 130 lb 3.2 oz (59.1 kg)  09/04/22 129 lb (58.5 kg)  08/26/22 132 lb 9.6 oz (60.1 kg)     GEN: Appears cachectic, no acute distress HEENT: Normal NECK: No JVD; No carotid bruits CARDIAC: RRR, no murmurs, rubs, gallops RESPIRATORY:  Clear to auscultation without rales, wheezing or rhonchi  ABDOMEN: Soft, non-tender, non-distended MUSCULOSKELETAL:  No edema; No deformity  SKIN: Warm and dry NEUROLOGIC:  Alert and oriented x 3 PSYCHIATRIC:  Normal affect   ASSESSMENT:    1. Shortness of breath   2. Hypotension, unspecified hypotension type    PLAN:    In order of problems listed above:  Shortness of breath, echo 8/24 reviewed by myself, showing with normal systolic function EF 50-55% Hypotension.  BP normal, continue salt tablets.  Follow-up as needed.     Medication Adjustments/Labs and Tests Ordered: Current medicines are  reviewed at length with the patient today.  Concerns regarding medicines are outlined above.  Orders Placed This Encounter  Procedures   EKG 12-Lead   No orders of the defined types were placed in this encounter.   Patient Instructions  Medication Instructions:   Your physician recommends that you continue on your current medications as directed. Please refer to the Current Medication list given to you today.  *If you need a refill on your cardiac medications before your next appointment, please call your pharmacy*  Lab Work:  None Ordered  If you have labs (blood work) drawn today and your tests are completely normal, you will receive your results only by: MyChart Message (if you have MyChart) OR A paper copy in the mail If you have any lab test that is abnormal or we need to change your treatment, we will call you to review the results.   Testing/Procedures:  None Ordered   Follow-Up: At [facility], you and your health needs are our priority.  As part of our continuing mission to provide you with exceptional heart care, we have created designated Provider Care Teams.  These Care Teams include your primary Cardiologist (physician) and Advanced Practice Providers (APPs -  Physician Assistants and Nurse Practitioners) who all work together to provide you with the care you need, when you need it.  We recommend signing up for the patient portal called "MyChart".  Sign up information is provided on this After Visit Summary.  MyChart is used to connect with patients for Virtual Visits (Telemedicine).  Patients are able to view lab/test results, encounter notes, upcoming appointments, etc.  Non-urgent messages can be sent to your provider as well.   To learn more about what you can do with MyChart, go to [email or web address].    Your next appointment:     As needed    Signed, Debbe Odea, MD  12/09/2022 2:55 PM    [facility]

## 2022-12-30 DIAGNOSIS — M1990 Unspecified osteoarthritis, unspecified site: Secondary | ICD-10-CM | POA: Diagnosis not present

## 2022-12-30 DIAGNOSIS — K219 Gastro-esophageal reflux disease without esophagitis: Secondary | ICD-10-CM | POA: Diagnosis not present

## 2022-12-30 DIAGNOSIS — M545 Low back pain, unspecified: Secondary | ICD-10-CM | POA: Diagnosis not present

## 2022-12-30 DIAGNOSIS — M62830 Muscle spasm of back: Secondary | ICD-10-CM | POA: Diagnosis not present

## 2022-12-30 DIAGNOSIS — M5011 Cervical disc disorder with radiculopathy,  high cervical region: Secondary | ICD-10-CM | POA: Diagnosis not present

## 2022-12-30 DIAGNOSIS — M5416 Radiculopathy, lumbar region: Secondary | ICD-10-CM | POA: Diagnosis not present

## 2022-12-30 DIAGNOSIS — G894 Chronic pain syndrome: Secondary | ICD-10-CM | POA: Diagnosis not present

## 2022-12-30 DIAGNOSIS — M5001 Cervical disc disorder with myelopathy,  high cervical region: Secondary | ICD-10-CM | POA: Diagnosis not present

## 2022-12-30 DIAGNOSIS — M5412 Radiculopathy, cervical region: Secondary | ICD-10-CM | POA: Diagnosis not present

## 2022-12-31 ENCOUNTER — Ambulatory Visit: Payer: Medicare HMO | Admitting: Nurse Practitioner

## 2022-12-31 ENCOUNTER — Ambulatory Visit: Payer: Medicare HMO | Admitting: Urology

## 2023-01-21 DIAGNOSIS — H524 Presbyopia: Secondary | ICD-10-CM | POA: Diagnosis not present

## 2023-01-28 ENCOUNTER — Encounter: Payer: Self-pay | Admitting: Nurse Practitioner

## 2023-01-28 MED ORDER — FINASTERIDE 1 MG PO TABS: 1.0000 mg | ORAL_TABLET | Freq: Every day | ORAL | 2 refills | Status: DC

## 2023-03-03 DIAGNOSIS — K219 Gastro-esophageal reflux disease without esophagitis: Secondary | ICD-10-CM | POA: Diagnosis not present

## 2023-03-03 DIAGNOSIS — M5416 Radiculopathy, lumbar region: Secondary | ICD-10-CM | POA: Diagnosis not present

## 2023-03-03 DIAGNOSIS — M5001 Cervical disc disorder with myelopathy,  high cervical region: Secondary | ICD-10-CM | POA: Diagnosis not present

## 2023-03-03 DIAGNOSIS — G894 Chronic pain syndrome: Secondary | ICD-10-CM | POA: Diagnosis not present

## 2023-03-03 DIAGNOSIS — M5011 Cervical disc disorder with radiculopathy,  high cervical region: Secondary | ICD-10-CM | POA: Diagnosis not present

## 2023-03-03 DIAGNOSIS — M5412 Radiculopathy, cervical region: Secondary | ICD-10-CM | POA: Diagnosis not present

## 2023-03-03 DIAGNOSIS — M545 Low back pain, unspecified: Secondary | ICD-10-CM | POA: Diagnosis not present

## 2023-03-03 DIAGNOSIS — M1990 Unspecified osteoarthritis, unspecified site: Secondary | ICD-10-CM | POA: Diagnosis not present

## 2023-03-03 DIAGNOSIS — M62830 Muscle spasm of back: Secondary | ICD-10-CM | POA: Diagnosis not present

## 2023-03-21 ENCOUNTER — Ambulatory Visit: Payer: Medicare HMO | Admitting: Nurse Practitioner

## 2023-03-24 ENCOUNTER — Ambulatory Visit: Payer: Self-pay | Admitting: Urology

## 2023-03-31 DIAGNOSIS — F1120 Opioid dependence, uncomplicated: Secondary | ICD-10-CM | POA: Diagnosis not present

## 2023-03-31 DIAGNOSIS — Z79891 Long term (current) use of opiate analgesic: Secondary | ICD-10-CM | POA: Diagnosis not present

## 2023-04-01 ENCOUNTER — Other Ambulatory Visit: Payer: Self-pay | Admitting: Urology

## 2023-04-01 DIAGNOSIS — N138 Other obstructive and reflux uropathy: Secondary | ICD-10-CM

## 2023-04-02 DIAGNOSIS — M545 Low back pain, unspecified: Secondary | ICD-10-CM | POA: Diagnosis not present

## 2023-04-02 DIAGNOSIS — M5001 Cervical disc disorder with myelopathy,  high cervical region: Secondary | ICD-10-CM | POA: Diagnosis not present

## 2023-04-02 DIAGNOSIS — M5416 Radiculopathy, lumbar region: Secondary | ICD-10-CM | POA: Diagnosis not present

## 2023-04-02 DIAGNOSIS — M62830 Muscle spasm of back: Secondary | ICD-10-CM | POA: Diagnosis not present

## 2023-04-02 DIAGNOSIS — K219 Gastro-esophageal reflux disease without esophagitis: Secondary | ICD-10-CM | POA: Diagnosis not present

## 2023-04-02 DIAGNOSIS — M1990 Unspecified osteoarthritis, unspecified site: Secondary | ICD-10-CM | POA: Diagnosis not present

## 2023-04-02 DIAGNOSIS — M5412 Radiculopathy, cervical region: Secondary | ICD-10-CM | POA: Diagnosis not present

## 2023-04-02 DIAGNOSIS — G894 Chronic pain syndrome: Secondary | ICD-10-CM | POA: Diagnosis not present

## 2023-04-02 DIAGNOSIS — M5011 Cervical disc disorder with radiculopathy,  high cervical region: Secondary | ICD-10-CM | POA: Diagnosis not present

## 2023-04-16 ENCOUNTER — Encounter: Payer: Self-pay | Admitting: Nurse Practitioner

## 2023-04-20 NOTE — Progress Notes (Unsigned)
 04/21/2023 6:30 PM   Larry Lin 17-Apr-1955 [id number]  Referring provider: Kara Dies, NP [address] [place],  [place] [address]  Urological history: 1. Hypogonadism  - contributing factors of age and chronic narcotics - testosterone pending - HCT/hgb pending  - AndroGel 1.62%, 4 pumps daily   2. ED - contributing factors of age, BPH and testosterone deficiency - tadalafil 5 mg daily   3. BPH with LU TS - PSA pending -prostate volume ~ 40 cc  -cysto (08/2022) - NED    3. Urgency - Contributing factors of age, BPH, lumbar spine issues, anxiety and history of smoking - Gemtesa 75 mg every three days   4. Nocturia -Risk factors for nocturia: ?obstructive sleep apnea, hypertension, heart disease, anxiety and BPH.    - has not been diagnosed with sleep apnea   No chief complaint on file.  HPI: Larry Lin is a 68 y.o. male who presents today for 3 month follow up.   Previous records reviewed.   I PSS ***   Score:  1-7 Mild 8-19 Moderate 20-35 Severe   SHIM ***   Score: 1-7 Severe ED 8-11 Moderate ED 12-16 Mild-Moderate ED 17-21 Mild ED 22-25 No ED      PMH: Past Medical History:  Diagnosis Date   Anemia    Arthritis    Corticostriatal-spinal degeneration (HCC)    GERD (gastroesophageal reflux disease)    Low testosterone     Surgical History: Past Surgical History:  Procedure Laterality Date   BACK SURGERY  2006   COLONOSCOPY WITH PROPOFOL N/A 12/04/2018   Procedure: COLONOSCOPY WITH PROPOFOL;  Surgeon: Toney Reil, MD;  Location: [facility] ENDOSCOPY;  Service: Gastroenterology;  Laterality: N/A;   diverticulitis surgery     removed some of lower intestine   ESOPHAGOGASTRODUODENOSCOPY (EGD) WITH PROPOFOL N/A 12/04/2018   Procedure: ESOPHAGOGASTRODUODENOSCOPY (EGD) WITH PROPOFOL;  Surgeon: Toney Reil, MD;  Location: [facility] ENDOSCOPY;  Service: Gastroenterology;  Laterality: N/A;   NECK SURGERY  2015    VASECTOMY     only right side done for nerve pain    Home Medications:  Allergies as of 04/21/2023       Reactions   Septra [sulfamethoxazole-trimethoprim] Other (See Comments)   Felt "spacey" like he couldn't concentrate while taking the antibiotic         Medication List        Accurate as of April 20, 2023  6:30 PM. If you have any questions, ask your nurse or doctor.          ALPRAZolam 0.25 MG tablet Commonly known as: XANAX Take 1 tablet (0.25 mg total) by mouth at bedtime as needed for anxiety.   ascorbic acid 100 MG tablet Commonly known as: VITAMIN C Take 1 tablet by mouth daily.   cephALEXin 250 MG capsule Commonly known as: KEFLEX Take one tablet daily   finasteride 1 MG tablet Commonly known as: PROPECIA Take 1 tablet (1 mg total) by mouth daily.   Gemtesa 75 MG Tabs Generic drug: Vibegron Take 1 tablet (75 mg total) by mouth daily.   lidocaine 5 % Commonly known as: LIDODERM Place 1 patch onto the skin daily.   MENS 50+ MULTI VITAMIN/MIN PO Take 1 tablet by mouth daily.   metroNIDAZOLE 1 % gel Commonly known as: Metrogel Apply topically daily.   morphine 30 MG 12 hr tablet Commonly known as: MS CONTIN Take 1 tablet by mouth 2 (two) times a day.  sodium chloride 1 g tablet Take 1 tablet (1 g total) by mouth 3 (three) times daily with meals.   tadalafil 5 MG tablet Commonly known as: CIALIS TAKE 1 TABLET BY MOUTH DAILY AS NEEDED FOR ERECTILE DYSFUNCTION   Testosterone 20.25 MG/ACT (1.62%) Gel APPLY 4 PUMPS TO THE SKIN ONCE DAILY AS DIRECTED   VITAMIN D PO Take 1 tablet by mouth 2 (two) times a week.        Allergies:  Allergies  Allergen Reactions   Septra [Sulfamethoxazole-Trimethoprim] Other (See Comments)    Felt "spacey" like he couldn't concentrate while taking the antibiotic     Family History: Family History  Problem Relation Age of Onset   Leukemia Mother    Healthy Father    Kidney disease Neg Hx    Prostate  cancer Neg Hx     Social History:  reports that he quit smoking about 36 years ago. His smoking use included cigarettes. He has been exposed to tobacco smoke. He has never used smokeless tobacco. He reports that he does not currently use alcohol after a past usage of about 1.0 standard drink of alcohol per week. He reports that he does not use drugs.  ROS: Pertinent ROS in HPI  Physical Exam: There were no vitals taken for this visit.  Constitutional:  Well nourished. Alert and oriented, No acute distress. HEENT: [place] AT, moist mucus membranes.  Trachea midline, no masses. Cardiovascular: No clubbing, cyanosis, or edema. Respiratory: Normal respiratory effort, no increased work of breathing. GI: Abdomen is soft, non tender, non distended, no abdominal masses. Liver and spleen not palpable.  No hernias appreciated.  Stool sample for occult testing is not indicated.   GU: No CVA tenderness.  No bladder fullness or masses.  Patient with circumcised/uncircumcised phallus. ***Foreskin easily retracted***  Urethral meatus is patent.  No penile discharge. No penile lesions or rashes. Scrotum without lesions, cysts, rashes and/or edema.  Testicles are located scrotally bilaterally. No masses are appreciated in the testicles. Left and right epididymis are normal. Rectal: Patient with  normal sphincter tone. Anus and perineum without scarring or rashes. No rectal masses are appreciated. Prostate is approximately *** grams, *** nodules are appreciated. Seminal vesicles are normal. Skin: No rashes, bruises or suspicious lesions. Lymph: No cervical or inguinal adenopathy. Neurologic: Grossly intact, no focal deficits, moving all 4 extremities. Psychiatric: Normal mood and affect.   Laboratory Data: Pending  Pertinent Imaging: N/A  Assessment & Plan:    1. Dysuria/Prostatitis  -He was unable to complete 6 weeks of Septra DS due to intolerable side effects -We discussed taking a low-dose antibiotic  daily for 3 months instead -I explained to him that the prostate is not very well penetrated with antibiotics and that is why we need to be on antibiotics for a long period of time to treat any infection that may be in the prostate -He will do Keflex to 50 mg daily for 3 months  2. Hypogonadism -testosterone levels pending -H & H pending -continue AndroGel, 4 pumps daily   3. BPH with LUTS -PSA pending -continue conservative management, avoiding bladder irritants and timed voiding's -Continue Cialis 5 mg daily  4. Erectile dysfunction:    - continue tadalafil 5 mg daily  No follow-ups on file.  These notes generated with voice recognition software. I apologize for typographical errors.  Cloretta Ned  [facility] [address]  [address] [place], [place] [address] [phone]

## 2023-04-21 ENCOUNTER — Other Ambulatory Visit: Payer: Self-pay

## 2023-04-21 ENCOUNTER — Ambulatory Visit: Payer: Medicare HMO | Admitting: Urology

## 2023-04-21 ENCOUNTER — Ambulatory Visit (INDEPENDENT_AMBULATORY_CARE_PROVIDER_SITE_OTHER): Admitting: Urology

## 2023-04-21 ENCOUNTER — Encounter: Payer: Self-pay | Admitting: Urology

## 2023-04-21 ENCOUNTER — Other Ambulatory Visit
Admission: RE | Admit: 2023-04-21 | Discharge: 2023-04-21 | Disposition: A | Discharge status: Home / Self Care | Attending: Urology | Admitting: Urology

## 2023-04-21 VITALS — BP 111/73 | HR 83 | Ht 74.0 in | Wt 133.5 lb

## 2023-04-21 DIAGNOSIS — E349 Endocrine disorder, unspecified: Secondary | ICD-10-CM

## 2023-04-21 DIAGNOSIS — E291 Testicular hypofunction: Secondary | ICD-10-CM | POA: Diagnosis not present

## 2023-04-21 DIAGNOSIS — N419 Inflammatory disease of prostate, unspecified: Secondary | ICD-10-CM | POA: Diagnosis not present

## 2023-04-21 DIAGNOSIS — N401 Enlarged prostate with lower urinary tract symptoms: Secondary | ICD-10-CM

## 2023-04-21 DIAGNOSIS — N138 Other obstructive and reflux uropathy: Secondary | ICD-10-CM

## 2023-04-21 DIAGNOSIS — N529 Male erectile dysfunction, unspecified: Secondary | ICD-10-CM

## 2023-04-21 LAB — PSA: Prostatic Specific Antigen: 0.12 ng/mL (ref 0.00–4.00)

## 2023-04-21 LAB — HEMOGLOBIN AND HEMATOCRIT, BLOOD
HCT: 37.4 % — ABNORMAL LOW (ref 39.0–52.0)
Hemoglobin: 12.7 g/dL — ABNORMAL LOW (ref 13.0–17.0)

## 2023-04-21 MED ORDER — CEPHALEXIN 250 MG PO CAPS
ORAL_CAPSULE | ORAL | 0 refills | Status: DC
Start: 2023-04-21 — End: 2023-09-30

## 2023-04-21 MED ORDER — TESTOSTERONE 20.25 MG/ACT (1.62%) TD GEL: TRANSDERMAL | 5 refills | Status: DC

## 2023-04-21 MED ORDER — GEMTESA 75 MG PO TABS
1.0000 | ORAL_TABLET | Freq: Every day | ORAL | 3 refills | Status: DC
Start: 2023-04-21 — End: 2023-07-14

## 2023-04-21 MED ORDER — TADALAFIL 5 MG PO TABS
5.0000 mg | ORAL_TABLET | Freq: Every day | ORAL | 1 refills | Status: DC
Start: 2023-04-21 — End: 2023-09-30

## 2023-04-22 ENCOUNTER — Encounter: Payer: Self-pay | Admitting: Nurse Practitioner

## 2023-04-22 ENCOUNTER — Ambulatory Visit: Payer: Medicare HMO | Admitting: Nurse Practitioner

## 2023-04-22 VITALS — BP 120/78 | HR 73 | Temp 98.1°F | Ht 74.0 in | Wt 135.0 lb

## 2023-04-22 DIAGNOSIS — Z13220 Encounter for screening for lipoid disorders: Secondary | ICD-10-CM

## 2023-04-22 DIAGNOSIS — Z0000 Encounter for general adult medical examination without abnormal findings: Secondary | ICD-10-CM

## 2023-04-22 DIAGNOSIS — D649 Anemia, unspecified: Secondary | ICD-10-CM | POA: Diagnosis not present

## 2023-04-22 LAB — TESTOSTERONE: Testosterone: 678 ng/dL (ref 264–916)

## 2023-04-22 MED ORDER — CALCIPOTRIENE-BETAMETH DIPROP 0.005-0.064 % EX OINT: TOPICAL_OINTMENT | Freq: Every day | CUTANEOUS | 0 refills | Status: DC

## 2023-04-22 NOTE — Progress Notes (Signed)
 Established Patient Office Visit  Subjective:  Patient ID: [name], male    DOB: Jun 06, 1955  Age: 68 y.o. MRN: [id number]  CC:  Chief Complaint  Patient presents with   Annual Exam   Discussed the use of a AI scribe software for clinical note transcription with the patient, who gave verbal consent to proceed.  HPI  Larry Lin presents for annual physical.   Diet: Patient does eat meat. Patient consumes fruits and veggies. Patient eat some  fried food. Patient drinks water and tea Exercise: 20 minutes Lin  Vaccine  Flu: declined Tetanus:due COVID:Johnson Shingles: due Colonoscopy: 2030, repeat in 2030, no polyps, Diverticulosis in the sigmoid, transverse and ascending colon. PSA: normal  Family history:  Colon cancer: No   Prostate cancer: No  Dentist: as needed Ophthalmology:due HIV screening: none reactive  Hep C screening: none reactive  Tobacco use: Former, quit 1989 Alcohol use: Occasionally  Illicit drugs: No   HPI   Past Medical History:  Diagnosis Date   Anemia    Arthritis    Corticostriatal-spinal degeneration (HCC)    GERD (gastroesophageal reflux disease)    Low testosterone     Past Surgical History:  Procedure Laterality Date   BACK SURGERY  2006   COLONOSCOPY WITH PROPOFOL N/A 12/04/2018   Procedure: COLONOSCOPY WITH PROPOFOL;  Surgeon: Larry Daily, MD;  Location: [facility] ENDOSCOPY;  Service: Gastroenterology;  Laterality: N/A;   diverticulitis surgery     removed some of lower intestine   ESOPHAGOGASTRODUODENOSCOPY (EGD) WITH PROPOFOL N/A 12/04/2018   Procedure: ESOPHAGOGASTRODUODENOSCOPY (EGD) WITH PROPOFOL;  Surgeon: Larry Daily, MD;  Location: [facility] ENDOSCOPY;  Service: Gastroenterology;  Laterality: N/A;   NECK SURGERY  2015   VASECTOMY     only right side done for nerve pain    Family History  Problem Relation Age of Onset   Leukemia Mother    Healthy Father    Kidney disease Neg Hx    Prostate  cancer Neg Hx     Social History   Socioeconomic History   Marital status: Divorced    Spouse name: Not on file   Number of children: Not on file   Years of education: Not on file   Highest education level: Bachelor's degree (e.g., BA, AB, BS)  Occupational History   Not on file  Tobacco Use   Smoking status: Former    Current packs/day: 0.00    Types: Cigarettes    Quit date: 01/22/1987    Years since quitting: 36.3    Passive exposure: Past   Smokeless tobacco: Never  Vaping Use   Vaping status: Never Used  Substance and Sexual Activity   Alcohol use: Not Currently    Alcohol/week: 1.0 standard drink of alcohol    Types: 1 Cans of beer per week   Drug use: No   Sexual activity: Yes    Birth control/protection: Condom  Other Topics Concern   Not on file  Social History Narrative   Not on file   Social Drivers of Health   Financial Resource Strain: Medium Risk (04/16/2023)   Overall Financial Resource Strain (CARDIA)    Difficulty of Paying Living Expenses: Somewhat hard  Food Insecurity: Food Insecurity Present (04/16/2023)   Hunger Vital Sign    Worried About Running Out of Food in the Last Year: Sometimes true    Ran Out of Food in the Last Year: Patient declined  Transportation Needs: Unmet Transportation Needs (04/16/2023)   PRAPARE -  Administrator, Civil Service (Medical): Yes    Lack of Transportation (Non-Medical): Patient declined  Physical Activity: Sufficiently Active (04/16/2023)   Exercise Vital Sign    Days of Exercise per Week: 4 days    Minutes of Exercise per Session: 40 min  Stress: Stress Concern Present (04/16/2023)   [employer] of Occupational Health - Occupational Stress Questionnaire    Feeling of Stress : To some extent  Social Connections: Unknown (04/16/2023)   Social Connection and Isolation Panel [NHANES]    Frequency of Communication with Friends and Family: Three times a week    Frequency of Social Gatherings with  Friends and Family: Once a week    Attends Religious Services: Patient declined    Database administrator or Organizations: No    Attends Engineer, structural: Not on file    Marital Status: Divorced  Intimate Partner Violence: Not on file     Outpatient Medications Prior to Visit  Medication Sig Dispense Refill   ALPRAZolam (XANAX) 0.25 MG tablet Take 1 tablet (0.25 mg total) by mouth at bedtime as needed for anxiety. 30 tablet 3   ascorbic acid (VITAMIN C) 100 MG tablet Take 1 tablet by mouth Lin.     cephALEXin (KEFLEX) 250 MG capsule Take one tablet Lin 90 capsule 0   finasteride (PROPECIA) 1 MG tablet Take 1 tablet (1 mg total) by mouth Lin. 30 tablet 2   lidocaine (LIDODERM) 5 % Place 1 patch onto the skin Lin.     metroNIDAZOLE (METROGEL) 1 % gel Apply topically Lin. 45 g 1   morphine (MS CONTIN) 15 MG 12 hr tablet Take 15 mg by mouth 3 (three) times Lin.     Multiple Vitamins-Minerals (MENS 50+ MULTI VITAMIN/MIN PO) Take 1 tablet by mouth Lin.     sodium chloride 1 g tablet Take 1 tablet (1 g total) by mouth 3 (three) times Lin with meals. 270 tablet 3   tadalafil (CIALIS) 5 MG tablet Take 1 tablet (5 mg total) by mouth Lin. 90 tablet 1   Testosterone 20.25 MG/ACT (1.62%) GEL APPLY 4 PUMPS TO THE SKIN ONCE Lin AS DIRECTED 300 g 5   Vibegron (GEMTESA) 75 MG TABS Take 1 tablet (75 mg total) by mouth Lin. 90 tablet 3   VITAMIN D PO Take 1 tablet by mouth 2 (two) times a week.     No facility-administered medications prior to visit.    Allergies  Allergen Reactions   Septra [Sulfamethoxazole-Trimethoprim] Other (See Comments)    Felt "spacey" like he couldn't concentrate while taking the antibiotic     ROS Review of Systems Negative unless indicated in HPI.    Objective:    Physical Exam Constitutional:      Appearance: Normal appearance. He is normal weight.  HENT:     Head: Normocephalic.     Right Ear: Tympanic membrane normal.      Left Ear: Tympanic membrane normal.     Mouth/Throat:     Mouth: Mucous membranes are moist.  Eyes:     Extraocular Movements: Extraocular movements intact.     Conjunctiva/sclera: Conjunctivae normal.     Pupils: Pupils are equal, round, and reactive to light.  Neck:     Thyroid: No thyroid mass or thyroid tenderness.  Cardiovascular:     Rate and Rhythm: Normal rate and regular rhythm.     Pulses: Normal pulses.     Heart sounds: Normal heart sounds. No murmur  heard. Pulmonary:     Effort: Pulmonary effort is normal.     Breath sounds: Normal breath sounds.  Abdominal:     General: Bowel sounds are normal.     Palpations: Abdomen is soft. There is no mass.     Tenderness: There is no abdominal tenderness. There is no rebound.  Musculoskeletal:        General: No swelling.     Cervical back: Neck supple. No tenderness.     Right lower leg: No edema.     Left lower leg: No edema.  Skin:    Findings: No bruising, erythema or rash.  Neurological:     General: No focal deficit present.     Mental Status: He is alert and oriented to person, place, and time. Mental status is at baseline.  Psychiatric:        Mood and Affect: Mood normal.        Behavior: Behavior normal.        Thought Content: Thought content normal.        Judgment: Judgment normal.     BP 120/78   Pulse 73   Temp 98.1 F (36.7 C)   Ht 6\' 2"  (1.88 m)   Wt 135 lb (61.2 kg)   SpO2 99%   BMI 17.33 kg/m  Wt Readings from Last 3 Encounters:  04/22/23 135 lb (61.2 kg)  04/21/23 133 lb 8 oz (60.6 kg)  12/09/22 130 lb (59 kg)     Health Maintenance  Topic Date Due   DTaP/Tdap/Td (1 - Tdap) 11/10/1984   Pneumonia Vaccine 57+ Years old (1 of 1 - PCV) Never done   Zoster Vaccines- Shingrix (1 of 2) Never done   Medicare Annual Wellness (AWV)  07/03/2019   COVID-19 Vaccine (2 - 2024-25 season) 05/08/2023 (Originally 09/22/2022)   INFLUENZA VACCINE  08/22/2023   Colonoscopy  12/03/2028   Hepatitis C  Screening  Completed   HPV VACCINES  Aged Out   Meningococcal B Vaccine  Aged Out    There are no preventive care reminders to display for this patient.  Lab Results  Component Value Date   TSH 0.57 04/22/2023   Lab Results  Component Value Date   WBC 8.4 04/22/2023   HGB 13.3 04/22/2023   HCT 39.0 04/22/2023   MCV 90.3 04/22/2023   PLT 239.0 04/22/2023   Lab Results  Component Value Date   NA 137 04/22/2023   K 4.5 04/22/2023   CO2 25 04/22/2023   GLUCOSE 92 04/22/2023   BUN 35 (H) 04/22/2023   CREATININE 0.78 04/22/2023   BILITOT 0.5 04/22/2023   ALKPHOS 47 04/22/2023   AST 16 04/22/2023   ALT 16 04/22/2023   PROT 7.8 04/22/2023   ALBUMIN 4.9 04/22/2023   CALCIUM 9.7 04/22/2023   ANIONGAP 9 01/05/2020   GFR 92.93 04/22/2023   Lab Results  Component Value Date   CHOL 182 04/22/2023   Lab Results  Component Value Date   HDL 64.80 04/22/2023   Lab Results  Component Value Date   LDLCALC 102 (H) 04/22/2023   Lab Results  Component Value Date   TRIG 76.0 04/22/2023   Lab Results  Component Value Date   CHOLHDL 3 04/22/2023   Lab Results  Component Value Date   HGBA1C 5.5 11/25/2014      Assessment & Plan:  Annual physical exam Assessment & Plan: Encouraged patient to consume a balanced diet and regular exercise regimen. Advised to see an opthalmology  and dentist annually.  Due for tetanus, shingles and pneumonia vaccine, deferred at present. Colonoscopy and PSA normal.    Orders: -     CBC with Differential/Platelet -     Comprehensive metabolic panel with GFR -     Lipid panel -     TSH -     VITAMIN D 25 Hydroxy (Vit-D Deficiency, Fractures) -     Iron, TIBC and Ferritin Panel  Other orders -     Calcipotriene-Betameth Diprop; Apply topically Lin.  Dispense: 60 g; Refill: 0    Follow-up: Return in about 6 months (around 10/22/2023) for chronic management.   Vesper Trant, NP

## 2023-04-22 NOTE — Patient Instructions (Signed)
 Preventive Care 73 Years and Older, Male Preventive care refers to lifestyle choices and visits with your health care provider that can promote health and wellness. Preventive care visits are also called wellness exams. What can I expect for my preventive care visit? Counseling During your preventive care visit, your health care provider may ask about your: Medical history, including: Past medical problems. Family medical history. History of falls. Current health, including: Emotional well-being. Home life and relationship well-being. Sexual activity. Memory and ability to understand (cognition). Lifestyle, including: Alcohol, nicotine or tobacco, and drug use. Access to firearms. Diet, exercise, and sleep habits. Work and work Astronomer. Sunscreen use. Safety issues such as seatbelt and bike helmet use. Physical exam Your health care provider will check your: Height and weight. These may be used to calculate your BMI (body mass index). BMI is a measurement that tells if you are at a healthy weight. Waist circumference. This measures the distance around your waistline. This measurement also tells if you are at a healthy weight and may help predict your risk of certain diseases, such as type 2 diabetes and high blood pressure. Heart rate and blood pressure. Body temperature. Skin for abnormal spots. What immunizations do I need?  Vaccines are usually given at various ages, according to a schedule. Your health care provider will recommend vaccines for you based on your age, medical history, and lifestyle or other factors, such as travel or where you work. What tests do I need? Screening Your health care provider may recommend screening tests for certain conditions. This may include: Lipid and cholesterol levels. Diabetes screening. This is done by checking your blood sugar (glucose) after you have not eaten for a while (fasting). Hepatitis C test. Hepatitis B test. HIV (human  immunodeficiency virus) test. STI (sexually transmitted infection) testing, if you are at risk. Lung cancer screening. Colorectal cancer screening. Prostate cancer screening. Abdominal aortic aneurysm (AAA) screening. You may need this if you are a current or former smoker. Talk with your health care provider about your test results, treatment options, and if necessary, the need for more tests. Follow these instructions at home: Eating and drinking  Eat a diet that includes fresh fruits and vegetables, whole grains, lean protein, and low-fat dairy products. Limit your intake of foods with high amounts of sugar, saturated fats, and salt. Take vitamin and mineral supplements as recommended by your health care provider. Do not drink alcohol if your health care provider tells you not to drink. If you drink alcohol: Limit how much you have to 0-2 drinks a day. Know how much alcohol is in your drink. In the [place] one drink equals one 12 oz bottle of beer (355 mL), one 5 oz glass of wine (148 mL), or one 1 oz glass of hard liquor (44 mL). Lifestyle Brush your teeth every morning and night with fluoride toothpaste. Floss one time each day. Exercise for at least 30 minutes 5 or more days each week. Do not use any products that contain nicotine or tobacco. These products include cigarettes, chewing tobacco, and vaping devices, such as e-cigarettes. If you need help quitting, ask your health care provider. Do not use drugs. If you are sexually active, practice safe sex. Use a condom or other form of protection to prevent STIs. Take aspirin only as told by your health care provider. Make sure that you understand how much to take and what form to take. Work with your health care provider to find out whether it is safe  and beneficial for you to take aspirin daily. Ask your health care provider if you need to take a cholesterol-lowering medicine (statin). Find healthy ways to manage stress, such  as: Meditation, yoga, or listening to music. Journaling. Talking to a trusted person. Spending time with friends and family. Safety Always wear your seat belt while driving or riding in a vehicle. Do not drive: If you have been drinking alcohol. Do not ride with someone who has been drinking. When you are tired or distracted. While texting. If you have been using any mind-altering substances or drugs. Wear a helmet and other protective equipment during sports activities. If you have firearms in your house, make sure you follow all gun safety procedures. Minimize exposure to UV radiation to reduce your risk of skin cancer. What's next? Visit your health care provider once a year for an annual wellness visit. Ask your health care provider how often you should have your eyes and teeth checked. Stay up to date on all vaccines. This information is not intended to replace advice given to you by your health care provider. Make sure you discuss any questions you have with your health care provider. Document Revised: 07/05/2020 Document Reviewed: 07/05/2020 Elsevier Patient Education  2024 [employer].

## 2023-04-23 LAB — COMPREHENSIVE METABOLIC PANEL WITH GFR
ALT: 16 U/L (ref 0–53)
AST: 16 U/L (ref 0–37)
Albumin: 4.9 g/dL (ref 3.5–5.2)
Alkaline Phosphatase: 47 U/L (ref 39–117)
BUN: 35 mg/dL — ABNORMAL HIGH (ref 6–23)
CO2: 25 meq/L (ref 19–32)
Calcium: 9.7 mg/dL (ref 8.4–10.5)
Chloride: 101 meq/L (ref 96–112)
Creatinine, Ser: 0.78 mg/dL (ref 0.40–1.50)
GFR: 92.93 mL/min (ref >60.00)
Glucose, Bld: 92 mg/dL (ref 70–99)
Potassium: 4.5 meq/L (ref 3.5–5.1)
Sodium: 137 meq/L (ref 135–145)
Total Bilirubin: 0.5 mg/dL (ref 0.2–1.2)
Total Protein: 7.8 g/dL (ref 6.0–8.3)

## 2023-04-23 LAB — CBC WITH DIFFERENTIAL/PLATELET
Basophils Absolute: 0 10*3/uL (ref 0.0–0.1)
Basophils Relative: 0.5 % (ref 0.0–3.0)
Eosinophils Absolute: 0.1 10*3/uL (ref 0.0–0.7)
Eosinophils Relative: 0.8 % (ref 0.0–5.0)
HCT: 39 % (ref 39.0–52.0)
Hemoglobin: 13.3 g/dL (ref 13.0–17.0)
Lymphocytes Relative: 28.7 % (ref 12.0–46.0)
Lymphs Abs: 2.4 10*3/uL (ref 0.7–4.0)
MCHC: 34.1 g/dL (ref 30.0–36.0)
MCV: 90.3 fl (ref 78.0–100.0)
Monocytes Absolute: 0.6 10*3/uL (ref 0.1–1.0)
Monocytes Relative: 6.7 % (ref 3.0–12.0)
Neutro Abs: 5.3 10*3/uL (ref 1.4–7.7)
Neutrophils Relative %: 63.3 % (ref 43.0–77.0)
Platelets: 239 10*3/uL (ref 150.0–400.0)
RBC: 4.32 Mil/uL (ref 4.22–5.81)
RDW: 14 % (ref 11.5–15.5)
WBC: 8.4 10*3/uL (ref 4.0–10.5)

## 2023-04-23 LAB — TSH: TSH: 0.57 u[IU]/mL (ref 0.35–5.50)

## 2023-04-23 LAB — LIPID PANEL
Cholesterol: 182 mg/dL (ref 0–200)
HDL: 64.8 mg/dL (ref >39.00)
LDL Cholesterol: 102 mg/dL — ABNORMAL HIGH (ref 0–99)
NonHDL: 117.28
Total CHOL/HDL Ratio: 3
Triglycerides: 76 mg/dL (ref 0.0–149.0)
VLDL: 15.2 mg/dL (ref 0.0–40.0)

## 2023-04-23 LAB — IRON,TIBC AND FERRITIN PANEL
%SAT: 31 % (ref 20–48)
Ferritin: 119 ng/mL (ref 24–380)
Iron: 105 ug/dL (ref 50–180)
TIBC: 335 ug/dL (ref 250–425)

## 2023-04-23 LAB — VITAMIN D 25 HYDROXY (VIT D DEFICIENCY, FRACTURES): VITD: 37.05 ng/mL (ref 30.00–100.00)

## 2023-04-29 ENCOUNTER — Other Ambulatory Visit (HOSPITAL_COMMUNITY): Payer: Self-pay

## 2023-04-29 ENCOUNTER — Telehealth: Payer: Self-pay | Admitting: Pharmacy Technician

## 2023-04-29 NOTE — Telephone Encounter (Signed)
 Pharmacy Patient Advocate Encounter   Received notification from CoverMyMeds that prior authorization for Calcipotriene-Betameth Diprop 0.005-0.064% ointment is required/requested.   Insurance verification completed.   The patient is insured through [employer] .   Per test claim:  SEE BELOW is preferred by the insurance.  If suggested medication is appropriate, Please send in a new RX and discontinue this one. If not, please advise as to why it's not appropriate so that we may request a Prior Authorization. Please note, some preferred medications may still require a PA.  If the suggested medications have not been trialed and there are no contraindications to their use, the PA will not be submitted, as it will not be approved.

## 2023-04-30 ENCOUNTER — Encounter: Payer: Self-pay | Admitting: Nurse Practitioner

## 2023-05-02 DIAGNOSIS — H524 Presbyopia: Secondary | ICD-10-CM | POA: Diagnosis not present

## 2023-05-05 DIAGNOSIS — Z0000 Encounter for general adult medical examination without abnormal findings: Secondary | ICD-10-CM | POA: Insufficient documentation

## 2023-05-05 NOTE — Assessment & Plan Note (Signed)
 Encouraged patient to consume a balanced diet and regular exercise regimen. Advised to see an opthalmology and dentist annually.  Due for tetanus, shingles and pneumonia vaccine, deferred at present. Colonoscopy and PSA normal.

## 2023-05-05 NOTE — Telephone Encounter (Signed)
 Medication pended for approval for 90 day supply

## 2023-05-06 MED ORDER — FINASTERIDE 1 MG PO TABS: 1.0000 mg | ORAL_TABLET | Freq: Every day | ORAL | 1 refills | Status: DC

## 2023-05-07 NOTE — Telephone Encounter (Signed)
 Can we have an update on possible therapy change?

## 2023-05-08 MED ORDER — BETAMETHASONE DIPROPIONATE 0.05 % EX CREA: TOPICAL_CREAM | Freq: Two times a day (BID) | CUTANEOUS | 2 refills | Status: DC

## 2023-05-08 NOTE — Telephone Encounter (Signed)
 Thank you for reaching out . We will try betamethasone dipropionate.

## 2023-05-23 DIAGNOSIS — H5211 Myopia, right eye: Secondary | ICD-10-CM | POA: Diagnosis not present

## 2023-05-23 DIAGNOSIS — H5202 Hypermetropia, left eye: Secondary | ICD-10-CM | POA: Diagnosis not present

## 2023-05-28 DIAGNOSIS — K219 Gastro-esophageal reflux disease without esophagitis: Secondary | ICD-10-CM | POA: Diagnosis not present

## 2023-05-28 DIAGNOSIS — M1990 Unspecified osteoarthritis, unspecified site: Secondary | ICD-10-CM | POA: Diagnosis not present

## 2023-05-28 DIAGNOSIS — M5416 Radiculopathy, lumbar region: Secondary | ICD-10-CM | POA: Diagnosis not present

## 2023-05-28 DIAGNOSIS — M5011 Cervical disc disorder with radiculopathy,  high cervical region: Secondary | ICD-10-CM | POA: Diagnosis not present

## 2023-05-28 DIAGNOSIS — M62830 Muscle spasm of back: Secondary | ICD-10-CM | POA: Diagnosis not present

## 2023-05-28 DIAGNOSIS — G894 Chronic pain syndrome: Secondary | ICD-10-CM | POA: Diagnosis not present

## 2023-05-28 DIAGNOSIS — M5412 Radiculopathy, cervical region: Secondary | ICD-10-CM | POA: Diagnosis not present

## 2023-05-28 DIAGNOSIS — M545 Low back pain, unspecified: Secondary | ICD-10-CM | POA: Diagnosis not present

## 2023-05-28 DIAGNOSIS — M5001 Cervical disc disorder with myelopathy,  high cervical region: Secondary | ICD-10-CM | POA: Diagnosis not present

## 2023-06-09 DIAGNOSIS — F1120 Opioid dependence, uncomplicated: Secondary | ICD-10-CM | POA: Diagnosis not present

## 2023-06-09 DIAGNOSIS — Z79891 Long term (current) use of opiate analgesic: Secondary | ICD-10-CM | POA: Diagnosis not present

## 2023-06-10 ENCOUNTER — Ambulatory Visit: Payer: Medicare HMO | Admitting: Nurse Practitioner

## 2023-07-09 DIAGNOSIS — M5416 Radiculopathy, lumbar region: Secondary | ICD-10-CM | POA: Diagnosis not present

## 2023-07-09 DIAGNOSIS — G894 Chronic pain syndrome: Secondary | ICD-10-CM | POA: Diagnosis not present

## 2023-07-09 DIAGNOSIS — M5412 Radiculopathy, cervical region: Secondary | ICD-10-CM | POA: Diagnosis not present

## 2023-07-09 DIAGNOSIS — M791 Myalgia, unspecified site: Secondary | ICD-10-CM | POA: Diagnosis not present

## 2023-07-09 DIAGNOSIS — M545 Low back pain, unspecified: Secondary | ICD-10-CM | POA: Diagnosis not present

## 2023-07-09 DIAGNOSIS — M542 Cervicalgia: Secondary | ICD-10-CM | POA: Diagnosis not present

## 2023-07-09 DIAGNOSIS — M5001 Cervical disc disorder with myelopathy,  high cervical region: Secondary | ICD-10-CM | POA: Diagnosis not present

## 2023-07-09 DIAGNOSIS — M62830 Muscle spasm of back: Secondary | ICD-10-CM | POA: Diagnosis not present

## 2023-07-09 DIAGNOSIS — M5011 Cervical disc disorder with radiculopathy,  high cervical region: Secondary | ICD-10-CM | POA: Diagnosis not present

## 2023-07-09 DIAGNOSIS — M1990 Unspecified osteoarthritis, unspecified site: Secondary | ICD-10-CM | POA: Diagnosis not present

## 2023-07-13 NOTE — Progress Notes (Unsigned)
 07/14/2023 8:50 PM   Larry Lin March 17, 1955 [id number]  Referring provider: Vincente Saber, NP [address] [place],  [place] [address]  Urological history: 1. Hypogonadism  - testosterone  (03/2023) 678 - HCT/hgb (03/2023) 12.7/37.4 - AndroGel  1.62%, 4 pumps daily   2. ED - tadalafil  5 mg daily   3. BPH with LU TS - PSA (03/2023) 0.12 -prostate volume ~ 40 cc  -cysto (08/2022) - NED    3. Urgency - Gemtesa  75 mg every three days   4. Nocturia - has not been diagnosed with sleep apnea   No chief complaint on file.  HPI: Larry Lin is a 68 y.o. man who presents today for recurrent UTI's.  Previous records reviewed.   UA ***  PMH: Past Medical History:  Diagnosis Date   Anemia    Arthritis    Corticostriatal-spinal degeneration (HCC)    GERD (gastroesophageal reflux disease)    Low testosterone      Surgical History: Past Surgical History:  Procedure Laterality Date   BACK SURGERY  2006   COLONOSCOPY WITH PROPOFOL  N/A 12/04/2018   Procedure: COLONOSCOPY WITH PROPOFOL ;  Surgeon: Unk Corinn Skiff, MD;  Location: [facility] ENDOSCOPY;  Service: Gastroenterology;  Laterality: N/A;   diverticulitis surgery     removed some of lower intestine   ESOPHAGOGASTRODUODENOSCOPY (EGD) WITH PROPOFOL  N/A 12/04/2018   Procedure: ESOPHAGOGASTRODUODENOSCOPY (EGD) WITH PROPOFOL ;  Surgeon: Unk Corinn Skiff, MD;  Location: [facility] ENDOSCOPY;  Service: Gastroenterology;  Laterality: N/A;   NECK SURGERY  2015   VASECTOMY     only right side done for nerve pain    Home Medications:  Allergies as of 07/14/2023       Reactions   Septra  [sulfamethoxazole -trimethoprim ] Other (See Comments)   Felt spacey like he couldn't concentrate while taking the antibiotic         Medication List        Accurate as of July 13, 2023  8:50 PM. If you have any questions, ask your nurse or doctor.          ALPRAZolam  0.25 MG tablet Commonly known as: XANAX  Take 1  tablet (0.25 mg total) by mouth at bedtime as needed for anxiety.   ascorbic acid 100 MG tablet Commonly known as: VITAMIN C Take 1 tablet by mouth daily.   betamethasone  dipropionate 0.05 % cream Apply topically 2 (two) times daily.   calcipotriene -betamethasone  ointment Commonly known as: Taclonex Apply topically daily.   cephALEXin  250 MG capsule Commonly known as: KEFLEX  Take one tablet daily   finasteride  1 MG tablet Commonly known as: PROPECIA  Take 1 tablet (1 mg total) by mouth daily.   Gemtesa  75 MG Tabs Generic drug: Vibegron  Take 1 tablet (75 mg total) by mouth daily.   lidocaine  5 % Commonly known as: LIDODERM  Place 1 patch onto the skin daily.   MENS 50+ MULTI VITAMIN/MIN PO Take 1 tablet by mouth daily.   metroNIDAZOLE  1 % gel Commonly known as: Metrogel  Apply topically daily.   morphine 15 MG 12 hr tablet Commonly known as: MS CONTIN Take 15 mg by mouth 3 (three) times daily.   sodium chloride  1 g tablet Take 1 tablet (1 g total) by mouth 3 (three) times daily with meals.   tadalafil  5 MG tablet Commonly known as: CIALIS  Take 1 tablet (5 mg total) by mouth daily.   Testosterone  20.25 MG/ACT (1.62%) Gel APPLY 4 PUMPS TO THE SKIN ONCE DAILY AS DIRECTED   VITAMIN D  PO Take 1  tablet by mouth 2 (two) times a week.        Allergies:  Allergies  Allergen Reactions   Septra  [Sulfamethoxazole -Trimethoprim ] Other (See Comments)    Felt spacey like he couldn't concentrate while taking the antibiotic     Family History: Family History  Problem Relation Age of Onset   Leukemia Mother    Healthy Father    Kidney disease Neg Hx    Prostate cancer Neg Hx     Social History:  reports that he quit smoking about 36 years ago. His smoking use included cigarettes. He has been exposed to tobacco smoke. He has never used smokeless tobacco. He reports that he does not currently use alcohol after a past usage of about 1.0 standard drink of alcohol per  week. He reports that he does not use drugs.  ROS: Pertinent ROS in HPI  Physical Exam: There were no vitals taken for this visit.  Constitutional:  Well nourished. Alert and oriented, No acute distress. HEENT: [place] AT, moist mucus membranes.  Trachea midline, no masses. Cardiovascular: No clubbing, cyanosis, or edema. Respiratory: Normal respiratory effort, no increased work of breathing. GI: Abdomen is soft, non tender, non distended, no abdominal masses. Liver and spleen not palpable.  No hernias appreciated.  Stool sample for occult testing is not indicated.   GU: No CVA tenderness.  No bladder fullness or masses.  Patient with circumcised/uncircumcised phallus. ***Foreskin easily retracted***  Urethral meatus is patent.  No penile discharge. No penile lesions or rashes. Scrotum without lesions, cysts, rashes and/or edema.  Testicles are located scrotally bilaterally. No masses are appreciated in the testicles. Left and right epididymis are normal. Rectal: Patient with  normal sphincter tone. Anus and perineum without scarring or rashes. No rectal masses are appreciated. Prostate is approximately *** grams, *** nodules are appreciated. Seminal vesicles are normal. Skin: No rashes, bruises or suspicious lesions. Lymph: No cervical or inguinal adenopathy. Neurologic: Grossly intact, no focal deficits, moving all 4 extremities. Psychiatric: Normal mood and affect.   Laboratory Data: See HPI and EPIC   Pertinent Imaging: N/A  Assessment & Plan:    1. Dysuria/Prostatitis  -Resolved -I sent in a refill on the Keflex  since he lives in [place]  and has difficulty getting into [facility] in case his symptoms recur -If the symptoms recur, he will let us  know  2. Hypogonadism -testosterone  levels pending -H & H low, but he has an appointment with his PCP tomorrow and they are following this as well -continue AndroGel , 4 pumps daily   3. BPH with LUTS -PSA pending -continue conservative  management, avoiding bladder irritants and timed voiding's -Continue Cialis  5 mg daily  4. Erectile dysfunction:    - continue tadalafil  5 mg daily  5. OAB -continue Gemtesa  75 mg daily to control his symptoms   No follow-ups on file.  These notes generated with voice recognition software. I apologize for typographical errors.  Larry Lin  [facility] [address]  [address] [place], [place] [address] [phone]

## 2023-07-14 ENCOUNTER — Ambulatory Visit (INDEPENDENT_AMBULATORY_CARE_PROVIDER_SITE_OTHER): Admitting: Urology

## 2023-07-14 ENCOUNTER — Encounter: Payer: Self-pay | Admitting: Urology

## 2023-07-14 ENCOUNTER — Other Ambulatory Visit: Payer: Self-pay

## 2023-07-14 ENCOUNTER — Other Ambulatory Visit
Admission: RE | Admit: 2023-07-14 | Discharge: 2023-07-14 | Disposition: A | Discharge status: Home / Self Care | Attending: Urology | Admitting: Urology

## 2023-07-14 VITALS — BP 99/66 | HR 88 | Ht 75.0 in | Wt 130.6 lb

## 2023-07-14 DIAGNOSIS — E349 Endocrine disorder, unspecified: Secondary | ICD-10-CM

## 2023-07-14 DIAGNOSIS — N138 Other obstructive and reflux uropathy: Secondary | ICD-10-CM | POA: Diagnosis not present

## 2023-07-14 DIAGNOSIS — R399 Unspecified symptoms and signs involving the genitourinary system: Secondary | ICD-10-CM | POA: Diagnosis not present

## 2023-07-14 DIAGNOSIS — Z981 Arthrodesis status: Secondary | ICD-10-CM | POA: Diagnosis not present

## 2023-07-14 DIAGNOSIS — N401 Enlarged prostate with lower urinary tract symptoms: Secondary | ICD-10-CM

## 2023-07-14 DIAGNOSIS — M19012 Primary osteoarthritis, left shoulder: Secondary | ICD-10-CM | POA: Diagnosis not present

## 2023-07-14 DIAGNOSIS — N411 Chronic prostatitis: Secondary | ICD-10-CM | POA: Diagnosis not present

## 2023-07-14 DIAGNOSIS — M47812 Spondylosis without myelopathy or radiculopathy, cervical region: Secondary | ICD-10-CM | POA: Diagnosis not present

## 2023-07-14 DIAGNOSIS — R1013 Epigastric pain: Secondary | ICD-10-CM

## 2023-07-14 DIAGNOSIS — G959 Disease of spinal cord, unspecified: Secondary | ICD-10-CM | POA: Diagnosis not present

## 2023-07-14 DIAGNOSIS — N390 Urinary tract infection, site not specified: Secondary | ICD-10-CM

## 2023-07-14 LAB — URINALYSIS, COMPLETE (UACMP) WITH MICROSCOPIC
Bilirubin Urine: NEGATIVE
Glucose, UA: NEGATIVE mg/dL
Hgb urine dipstick: NEGATIVE
Ketones, ur: NEGATIVE mg/dL
Leukocytes,Ua: NEGATIVE
Nitrite: NEGATIVE
Protein, ur: NEGATIVE mg/dL
RBC / HPF: NONE SEEN RBC/hpf (ref 0–5)
Specific Gravity, Urine: 1.02 (ref 1.005–1.030)
Squamous Epithelial / HPF: NONE SEEN /HPF (ref 0–5)
pH: 5.5 (ref 5.0–8.0)

## 2023-07-14 MED ORDER — TESTOSTERONE 20.25 MG/ACT (1.62%) TD GEL: TRANSDERMAL | 5 refills | Status: DC

## 2023-07-14 MED ORDER — DOXYCYCLINE HYCLATE 100 MG PO CAPS: 100.0000 mg | ORAL_CAPSULE | Freq: Two times a day (BID) | ORAL | 0 refills | Status: DC

## 2023-07-14 MED ORDER — GEMTESA 75 MG PO TABS
1.0000 | ORAL_TABLET | Freq: Every day | ORAL | 3 refills | Status: DC
Start: 2023-07-14 — End: 2023-09-30

## 2023-07-30 DIAGNOSIS — M1990 Unspecified osteoarthritis, unspecified site: Secondary | ICD-10-CM | POA: Diagnosis not present

## 2023-07-30 DIAGNOSIS — M5416 Radiculopathy, lumbar region: Secondary | ICD-10-CM | POA: Diagnosis not present

## 2023-07-30 DIAGNOSIS — M791 Myalgia, unspecified site: Secondary | ICD-10-CM | POA: Diagnosis not present

## 2023-07-30 DIAGNOSIS — M545 Low back pain, unspecified: Secondary | ICD-10-CM | POA: Diagnosis not present

## 2023-07-30 DIAGNOSIS — M62830 Muscle spasm of back: Secondary | ICD-10-CM | POA: Diagnosis not present

## 2023-07-30 DIAGNOSIS — M5011 Cervical disc disorder with radiculopathy,  high cervical region: Secondary | ICD-10-CM | POA: Diagnosis not present

## 2023-07-30 DIAGNOSIS — M961 Postlaminectomy syndrome, not elsewhere classified: Secondary | ICD-10-CM | POA: Diagnosis not present

## 2023-07-30 DIAGNOSIS — M5412 Radiculopathy, cervical region: Secondary | ICD-10-CM | POA: Diagnosis not present

## 2023-07-30 DIAGNOSIS — G894 Chronic pain syndrome: Secondary | ICD-10-CM | POA: Diagnosis not present

## 2023-08-11 ENCOUNTER — Ambulatory Visit: Admitting: Urology

## 2023-08-13 ENCOUNTER — Ambulatory Visit

## 2023-08-25 DIAGNOSIS — M5412 Radiculopathy, cervical region: Secondary | ICD-10-CM | POA: Diagnosis not present

## 2023-08-25 DIAGNOSIS — M5011 Cervical disc disorder with radiculopathy,  high cervical region: Secondary | ICD-10-CM | POA: Diagnosis not present

## 2023-08-25 DIAGNOSIS — M5416 Radiculopathy, lumbar region: Secondary | ICD-10-CM | POA: Diagnosis not present

## 2023-08-25 DIAGNOSIS — M62830 Muscle spasm of back: Secondary | ICD-10-CM | POA: Diagnosis not present

## 2023-08-25 DIAGNOSIS — M961 Postlaminectomy syndrome, not elsewhere classified: Secondary | ICD-10-CM | POA: Diagnosis not present

## 2023-08-25 DIAGNOSIS — M1990 Unspecified osteoarthritis, unspecified site: Secondary | ICD-10-CM | POA: Diagnosis not present

## 2023-08-25 DIAGNOSIS — G894 Chronic pain syndrome: Secondary | ICD-10-CM | POA: Diagnosis not present

## 2023-08-25 DIAGNOSIS — M791 Myalgia, unspecified site: Secondary | ICD-10-CM | POA: Diagnosis not present

## 2023-08-25 DIAGNOSIS — M545 Low back pain, unspecified: Secondary | ICD-10-CM | POA: Diagnosis not present

## 2023-08-27 ENCOUNTER — Ambulatory Visit

## 2023-08-27 DIAGNOSIS — F1120 Opioid dependence, uncomplicated: Secondary | ICD-10-CM | POA: Diagnosis not present

## 2023-08-27 DIAGNOSIS — Z79891 Long term (current) use of opiate analgesic: Secondary | ICD-10-CM | POA: Diagnosis not present

## 2023-08-28 ENCOUNTER — Ambulatory Visit: Admission: RE | Admit: 2023-08-28 | Source: Ambulatory Visit

## 2023-09-04 ENCOUNTER — Ambulatory Visit: Admitting: Urology

## 2023-09-15 ENCOUNTER — Ambulatory Visit
Admission: RE | Admit: 2023-09-15 | Discharge: 2023-09-15 | Disposition: A | Discharge status: Home / Self Care | Source: Ambulatory Visit | Attending: Urology | Admitting: Urology

## 2023-09-15 DIAGNOSIS — M545 Low back pain, unspecified: Secondary | ICD-10-CM | POA: Diagnosis not present

## 2023-09-15 DIAGNOSIS — Z79891 Long term (current) use of opiate analgesic: Secondary | ICD-10-CM | POA: Diagnosis not present

## 2023-09-15 DIAGNOSIS — M5412 Radiculopathy, cervical region: Secondary | ICD-10-CM | POA: Diagnosis not present

## 2023-09-15 DIAGNOSIS — R1013 Epigastric pain: Secondary | ICD-10-CM | POA: Diagnosis not present

## 2023-09-15 DIAGNOSIS — F1120 Opioid dependence, uncomplicated: Secondary | ICD-10-CM | POA: Diagnosis not present

## 2023-09-15 DIAGNOSIS — M791 Myalgia, unspecified site: Secondary | ICD-10-CM | POA: Diagnosis not present

## 2023-09-15 DIAGNOSIS — M5011 Cervical disc disorder with radiculopathy,  high cervical region: Secondary | ICD-10-CM | POA: Diagnosis not present

## 2023-09-15 DIAGNOSIS — K8689 Other specified diseases of pancreas: Secondary | ICD-10-CM | POA: Diagnosis not present

## 2023-09-15 DIAGNOSIS — N411 Chronic prostatitis: Secondary | ICD-10-CM | POA: Diagnosis not present

## 2023-09-15 DIAGNOSIS — G894 Chronic pain syndrome: Secondary | ICD-10-CM | POA: Diagnosis not present

## 2023-09-15 DIAGNOSIS — M1990 Unspecified osteoarthritis, unspecified site: Secondary | ICD-10-CM | POA: Diagnosis not present

## 2023-09-15 DIAGNOSIS — K5710 Diverticulosis of small intestine without perforation or abscess without bleeding: Secondary | ICD-10-CM | POA: Diagnosis not present

## 2023-09-15 DIAGNOSIS — M5416 Radiculopathy, lumbar region: Secondary | ICD-10-CM | POA: Diagnosis not present

## 2023-09-15 DIAGNOSIS — M961 Postlaminectomy syndrome, not elsewhere classified: Secondary | ICD-10-CM | POA: Diagnosis not present

## 2023-09-15 DIAGNOSIS — M62830 Muscle spasm of back: Secondary | ICD-10-CM | POA: Diagnosis not present

## 2023-09-15 DIAGNOSIS — N400 Enlarged prostate without lower urinary tract symptoms: Secondary | ICD-10-CM | POA: Diagnosis not present

## 2023-09-15 MED ORDER — IOHEXOL 300 MG/ML  SOLN
80.0000 mL | Freq: Once | INTRAMUSCULAR | Status: AC | PRN
  Administered 2023-09-15: 80 mL via INTRAVENOUS

## 2023-09-19 ENCOUNTER — Encounter

## 2023-09-25 ENCOUNTER — Ambulatory Visit: Admitting: Neurosurgery

## 2023-09-26 ENCOUNTER — Encounter: Payer: Self-pay | Admitting: Physician Assistant

## 2023-09-26 NOTE — Progress Notes (Signed)
 Referring Physician:  Vincente Saber, NP [address] [place],  [place] [address]  Primary Physician:  Vincente Saber, NP  History of Present Illness: 09/30/2023 Mr. Larry Lin is here today with a chief complaint of posterior neck pain and left shoulder pain.  Currently seeing orthopedic surgery for his left shoulder difficulties.  Previous cervical spinal fusion at C3-4 and C6-7.  He feels that his neck and shoulder pain has become worse.  Most recent shoulder injection did help his shoulder pain.  He describes pain in the back of his neck rarely radiates up the back of the right side of his head behind the ear.  Denies any radiation down his arms or any numbness and tingling that goes down his extremities.  He also discussed with me his significant weight loss.  Currently BMI is at 16.  He states that he often does not feel hungry, he has pain when he eats and he has difficulty getting enough nutrition.  He adds that he has had some increased confusion and forgetfulness in addition to headaches which she is not sure the cause.   Duration: 2002 Severity: 6  Precipitating: aggravated by sleeping wrong, most upper body activity Modifying factors: made better by heat, lying down  Weakness: none Timing: constant Bowel/Bladder Dysfunction: none  Conservative measures:  Physical therapy: has not participated in Multimodal medical therapy including regular antiinflammatories: Morphine, Tylenol , Baclofen, Tizanidine, Lidocaine  patches Injections:  he has had injections in the past in the neck(10+ years ago) not really helpful. He has recently had a left shoulder injection, this did help.    Past Surgery:  2012- s/p cervical spinal fusion-C3-4 and C6-7 2006-lumbar fusion    The symptoms are causing a significant impact on the patient's life.   Review of Systems:  A 10 point review of systems is negative, except for the pertinent positives and negatives detailed in the  HPI.  Past Medical History: Past Medical History:  Diagnosis Date   Anemia    Arthritis    Cervical spondylolysis    Corticostriatal-spinal degeneration (HCC)    GERD (gastroesophageal reflux disease)    Low testosterone      Past Surgical History: Past Surgical History:  Procedure Laterality Date   BACK SURGERY  2006   COLECTOMY  2000   COLONOSCOPY WITH PROPOFOL  N/A 12/04/2018   Procedure: COLONOSCOPY WITH PROPOFOL ;  Surgeon: Unk Corinn Skiff, MD;  Location: [facility] END[facility]COPY;  Service: Gastroenterology;  Laterality: N/A;   diverticulitis surgery     removed some of lower intestine   ESOPHAGOGASTRODUODENOSCOPY (EGD) WITH PROPOFOL  N/A 12/04/2018   Procedure: ESOPHAGOGASTRODUODENOSCOPY (EGD) WITH PROPOFOL ;  Surgeon: Unk Corinn Skiff, MD;  Location: [facility] ENDOSCOPY;  Service: Gastroenterology;  Laterality: N/A;   NECK SURGERY  2015   VASECTOMY     only right side done for nerve pain    Allergies: Allergies as of 09/30/2023 - Review Complete 09/30/2023  Allergen Reaction Noted   Septra  [sulfamethoxazole -trimethoprim ] Other (See Comments) 12/09/2022    Medications: Outpatient Encounter Medications as of 09/30/2023  Medication Sig   ALPRAZolam  (XANAX ) 0.25 MG tablet Take 1 tablet (0.25 mg total) by mouth at bedtime as needed for anxiety.   ascorbic acid (VITAMIN C) 100 MG tablet Take 1 tablet by mouth daily.   betamethasone  dipropionate 0.05 % cream Apply topically 2 (two) times daily.   calcipotriene -betamethasone  (TACLONEX) ointment Apply topically daily.   doxycycline  (VIBRAMYCIN ) 100 MG capsule Take 1 capsule (100 mg total) by mouth every 12 (twelve) hours.  finasteride  (PROPECIA ) 1 MG tablet Take 1 tablet (1 mg total) by mouth daily.   lidocaine  (LIDODERM ) 5 % Place 1 patch onto the skin daily.   metroNIDAZOLE  (METROGEL ) 1 % gel Apply topically daily.   mirabegron  ER (MYRBETRIQ ) 50 MG TB24 tablet Take 1 tablet (50 mg total) by mouth daily.   morphine (MS CONTIN) 30 MG  12 hr tablet Take 30 mg by mouth every 12 (twelve) hours.   Multiple Vitamins-Minerals (MENS 50+ MULTI VITAMIN/MIN PO) Take 1 tablet by mouth daily.   sodium chloride  1 g tablet Take 1 tablet (1 g total) by mouth 3 (three) times daily with meals.   tadalafil  (CIALIS ) 5 MG tablet Take 1 tablet (5 mg total) by mouth daily as needed for erectile dysfunction.   Testosterone  20.25 MG/ACT (1.62%) GEL APPLY 4 PUMPS TO THE SKIN ONCE DAILY AS DIRECTED   VITAMIN D  PO Take 1 tablet by mouth 2 (two) times a week.   [DISCONTINUED] cephALEXin  (KEFLEX ) 250 MG capsule Take one tablet daily   [DISCONTINUED] doxycycline  (VIBRAMYCIN ) 100 MG capsule Take 1 capsule (100 mg total) by mouth every 12 (twelve) hours.   [DISCONTINUED] morphine (MS CONTIN) 15 MG 12 hr tablet Take 15 mg by mouth 3 (three) times daily.   [DISCONTINUED] tadalafil  (CIALIS ) 5 MG tablet Take 1 tablet (5 mg total) by mouth daily.   [DISCONTINUED] Vibegron  (GEMTESA ) 75 MG TABS Take 1 tablet (75 mg total) by mouth daily.   No facility-administered encounter medications on file as of 09/30/2023.    Social History: Social History   Tobacco Use   Smoking status: Former    Current packs/day: 0.00    Types: Cigarettes    Quit date: 01/22/1987    Years since quitting: 36.7    Passive exposure: Past   Smokeless tobacco: Never  Vaping Use   Vaping status: Never Used  Substance Use Topics   Alcohol use: Not Currently    Alcohol/week: 1.0 standard drink of alcohol    Types: 1 Cans of beer per week   Drug use: No    Family Medical History: Family History  Problem Relation Age of Onset   Leukemia Mother    Healthy Father    Kidney disease Neg Hx    Prostate cancer Neg Hx     Physical Examination: @VITALWITHPAIN @  General: Patient is well developed, well nourished, calm, collected, and in no apparent distress. Attention to examination is appropriate.  Psychiatric: Patient is non-anxious.  Head:  Pupils equal, round, and reactive to  light.  ENT:  Oral mucosa appears well hydrated.  Neck:   Supple.    Respiratory: Patient is breathing without any difficulty.  Extremities: No edema.  Vascular: Palpable dorsal pedal pulses.  Skin:   On exposed skin, there are no abnormal skin lesions.  NEUROLOGICAL:     Awake, alert, oriented to person, place, and time.  Speech is clear and fluent. Fund of knowledge is appropriate.   Cranial Nerves: Pupils equal round and reactive to light.  Facial tone is symmetric.  ROM of spine: Slightly decreased range of motion of cervical spine.  Positive Tinel of right occipital nerve.  Strength: Side Biceps Triceps Deltoid Interossei Grip Wrist Ext. Wrist Flex.  R 5 5 5 5 5 5 5   L 5 5 3 5 5 5 5     Reflexes are 2+ and symmetric at the biceps, triceps, brachioradialis.   Hoffman's is absent.  Clonus is not present.  Toes are down-going.  Bilateral  upper and lower extremity sensation is intact to light touch.    Gait is normal.   No difficulty with tandem gait.   No evidence of dysmetria noted.  Medical Decision Making  Imaging: AP and lateral views of the cervical spine were obtained today in the  office and reviewed by me. These x-rays do demonstrate that the patient is  status post a cervical fusion involving the C3-4 in addition to the C6-7  vertebral bodies. Above the lower fusion the patient does have moderate  osteoarthritic changes with anterior osteophyte formation. Moderate facet  spondylosis noted throughout the cervical spine. There does not appear to  be any evidence of hardware failure, no loosening. No acute fractures  identified. There does not appear to be any significant changes compared  to x-rays obtained last year.   I have personally reviewed the images and agree with the above interpretation.  Assessment and Plan: Mr. Seitzinger is a pleasant 68 y.o. male with previous C3-4 and C6-7 fusion who comes in today for increased posterior neck pain radiating to  back of his head behind his right ear.  He has no numbness, tingling that radiates down bilateral upper extremities.  He does have some baseline left shoulder issues in which she follows with orthopedics for.  Also concern is his significant weight loss and him also endorsing confusion and forgetfulness.  This could be due to chronic UTI.  However, most recent CT of abdomen and pelvis noted a mild prominence of his pancreatic duct and would consider correlation with MRCP.  The patient asked me about this I went ahead and ordered the MRCP for him.  In regards to his posterior neck pain, part of his postsurgical I believe however he is also describing a right occipital neuralgia.  Positive Tinel.  I do think he could possibly benefit from an injection.  Referral for this has been made.  Concerns due to his malnutrition, anorexia, significant weight loss.  As stated above MRCP for follow-up has been ordered.  However patient has also endorsing headaches, forgetfulness, some confusion.  MRI brain has also been ordered.  Will review results once complete.  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  Will consider MRI of his cervical spine, and in person PT if patient is able to make it work in the future.   Thank you for involving me in the care of this patient.   I spent a total of 45 minutes in both face-to-face and non-face-to-face activities for this visit on the date of this encounter including preparing to see the patient, obtaining and reviewing separately obtained history, performing medically appropriate examination, counseling the patient, ordering additional tests, documenting clinical information, independently interpreting results, coordination of care.   Lyle Decamp, PA-C Dept. of Neurosurgery

## 2023-09-28 NOTE — Progress Notes (Unsigned)
 09/30/2023 10:31 AM   Larry Lin Apr 05, 1955 [id number]  Referring provider: Vincente Saber, NP [address] [place],  [place] [address]  Urological history: 1. Hypogonadism  - testosterone  pending - HCT/hgb pending - AndroGel  1.62%, 4 pumps daily   2. ED - tadalafil  5 mg daily   3. BPH with LU TS - PSA pending -prostate volume ~ 40 cc  -cysto (08/2022) - NED    3. Urgency - Gemtesa  75 mg every three days   4. Nocturia - has not been diagnosed with sleep apnea   Chief Complaint  Patient presents with   Hypogonadism   HPI: Larry Lin is a 68 y.o. man who presents today for CT report.    Previous records reviewed.   At his visit in June, he had the return of cloudy urine, dysuria and malodorous urine.  He took Keflex  250 mg daily for 90 days without relief.  He then tried Septra  DS for 2 weeks but could not tolerated secondary to confusion and lightheadedness.  At the time of his visit with me, he is no longer having symptoms.  He has been pain in the epigastric region off and on during this time as well.  He states he had an endoscopic and colonoscopy 2 years ago which were normal.  He states that it just wreaks havoc over his whole body.  Patient denies any modifying or aggravating factors.  Patient denies any recent UTI's, gross hematuria, dysuria or suprapubic/flank pain.  Patient denies any fevers, chills, nausea or vomiting.   UA rare bacteria  Contrast CT performed on August 25 noted heterogeneous enhancement of an enlarged prostate gland, which may reflect prostatitis and symmetrical wall thickening of the urinary bladder which may reflect cystitis.  There was also enlargement of pancreatic duct that has slightly progressed from the CT in 2020.  He states the week prior to his CT scan he started feeling fatigue and dysuria.  He started AZO, but it did not control his symptoms.  He then started the doxycycline  100 mg twice daily that he had on hand  and is starting to feel better.  Patient denies any modifying or aggravating factors.  Patient denies any gross hematuria or suprapubic/flank pain.  Patient denies any fevers, chills, nausea or vomiting.    He also read that the Gemtesa  has the side effect of UTI and he would like to try a different OAB medication.  PMH: Past Medical History:  Diagnosis Date   Anemia    Arthritis    Cervical spondylolysis    Corticostriatal-spinal degeneration (HCC)    GERD (gastroesophageal reflux disease)    Low testosterone      Surgical History: Past Surgical History:  Procedure Laterality Date   BACK SURGERY  2006   COLECTOMY  2000   COLONOSCOPY WITH PROPOFOL  N/A 12/04/2018   Procedure: COLONOSCOPY WITH PROPOFOL ;  Surgeon: Unk Corinn Skiff, MD;  Location: [facility] ENDOSCOPY;  Service: Gastroenterology;  Laterality: N/A;   diverticulitis surgery     removed some of lower intestine   ESOPHAGOGASTRODUODENOSCOPY (EGD) WITH PROPOFOL  N/A 12/04/2018   Procedure: ESOPHAGOGASTRODUODENOSCOPY (EGD) WITH PROPOFOL ;  Surgeon: Unk Corinn Skiff, MD;  Location: [facility] ENDOSCOPY;  Service: Gastroenterology;  Laterality: N/A;   NECK SURGERY  2015   VASECTOMY     only right side done for nerve pain    Home Medications:  Allergies as of 09/30/2023       Reactions   Septra  [sulfamethoxazole -trimethoprim ] Other (See Comments)  Felt spacey like he couldn't concentrate while taking the antibiotic         Medication List        Accurate as of September 30, 2023 10:31 AM. If you have any questions, ask your nurse or doctor.          STOP taking these medications    cephALEXin  250 MG capsule Commonly known as: KEFLEX  Stopped by: CLOTILDA CORNWALL   Gemtesa  75 MG Tabs Generic drug: Vibegron  Stopped by: CLOTILDA Katrianna Friesenhahn       TAKE these medications    ALPRAZolam  0.25 MG tablet Commonly known as: XANAX  Take 1 tablet (0.25 mg total) by mouth at bedtime as needed for anxiety.   ascorbic acid  100 MG tablet Commonly known as: VITAMIN C Take 1 tablet by mouth daily.   betamethasone  dipropionate 0.05 % cream Apply topically 2 (two) times daily.   calcipotriene -betamethasone  ointment Commonly known as: Taclonex Apply topically daily.   doxycycline  100 MG capsule Commonly known as: VIBRAMYCIN  Take 1 capsule (100 mg total) by mouth every 12 (twelve) hours. What changed: Another medication with the same name was added. Make sure you understand how and when to take each. Changed by: CLOTILDA CORNWALL   doxycycline  100 MG capsule Commonly known as: VIBRAMYCIN  Take 1 capsule (100 mg total) by mouth every 12 (twelve) hours. What changed: You were already taking a medication with the same name, and this prescription was added. Make sure you understand how and when to take each. Changed by: Rande Roylance   finasteride  1 MG tablet Commonly known as: PROPECIA  Take 1 tablet (1 mg total) by mouth daily.   lidocaine  5 % Commonly known as: LIDODERM  Place 1 patch onto the skin daily.   MENS 50+ MULTI VITAMIN/MIN PO Take 1 tablet by mouth daily.   metroNIDAZOLE  1 % gel Commonly known as: Metrogel  Apply topically daily.   mirabegron  ER 50 MG Tb24 tablet Commonly known as: MYRBETRIQ  Take 1 tablet (50 mg total) by mouth daily. Started by: CLOTILDA CORNWALL   morphine 15 MG 12 hr tablet Commonly known as: MS CONTIN Take 15 mg by mouth 3 (three) times daily.   morphine 30 MG 12 hr tablet Commonly known as: MS CONTIN Take 30 mg by mouth every 12 (twelve) hours.   sodium chloride  1 g tablet Take 1 tablet (1 g total) by mouth 3 (three) times daily with meals.   tadalafil  5 MG tablet Commonly known as: CIALIS  Take 1 tablet (5 mg total) by mouth daily. What changed: Another medication with the same name was added. Make sure you understand how and when to take each. Changed by: CLOTILDA CORNWALL   tadalafil  5 MG tablet Commonly known as: CIALIS  Take 1 tablet (5 mg total) by  mouth daily as needed for erectile dysfunction. What changed: You were already taking a medication with the same name, and this prescription was added. Make sure you understand how and when to take each. Changed by: Avyay Coger   Testosterone  20.25 MG/ACT (1.62%) Gel APPLY 4 PUMPS TO THE SKIN ONCE DAILY AS DIRECTED   VITAMIN D  PO Take 1 tablet by mouth 2 (two) times a week.        Allergies:  Allergies  Allergen Reactions   Septra  [Sulfamethoxazole -Trimethoprim ] Other (See Comments)    Felt spacey like he couldn't concentrate while taking the antibiotic     Family History: Family History  Problem Relation Age of Onset   Leukemia Mother    Healthy Father  Kidney disease Neg Hx    Prostate cancer Neg Hx     Social History:  reports that he quit smoking about 36 years ago. His smoking use included cigarettes. He has been exposed to tobacco smoke. He has never used smokeless tobacco. He reports that he does not currently use alcohol after a past usage of about 1.0 standard drink of alcohol per week. He reports that he does not use drugs.  ROS: Pertinent ROS in HPI  Physical Exam: BP 101/63 (BP Location: Left Arm, Patient Position: Sitting, Cuff Size: Normal)   Pulse 67   Ht 6' 2 (1.88 m)   Wt 130 lb (59 kg)   BMI 16.69 kg/m   Constitutional:  Well nourished. Alert and oriented, No acute distress. HEENT: [place] AT, moist mucus membranes.  Trachea midline Cardiovascular: No clubbing, cyanosis, or edema. Respiratory: Normal respiratory effort, no increased work of breathing. Neurologic: Grossly intact, no focal deficits, moving all 4 extremities. Psychiatric: Normal mood and affect.   Laboratory Data: See HPI and EPIC  I have reviewed the labs.  See HPI.     Pertinent Imaging: N/A  Assessment & Plan:    1. Prostatitis  - Symptoms started again 1 week ago - Cystoscopy and triphasic CT have not found anything worrisome - I will have him start doxycycline  100  mg twice daily for 30 days, I did caution him that it will make him more sensitive to the sun, so I advised him to wear SPF 50 or higher sunscreen and to limit time in the sun  2. Hypogonadism - continue AndroGel , 4 pumps daily, refill given  - Testosterone  level, hemoglobin, hematocrit are pending  3. BPH with LUTS -continue conservative management, avoiding bladder irritants and timed voiding's -Continue Cialis  5 mg daily - PSA is pending  4. Erectile dysfunction:    - continue tadalafil  5 mg daily  5. OAB - Will switch to Myrbetriq  50 mg as he has been on this in the past, he does not take OAB medication consistently, but I still want to stay away from anticholinergics due to their side effect profile and with his potential GI issues   Return for Follow up pending labs.  These notes generated with voice recognition software. I apologize for typographical errors.  CLOTILDA HELON RIGGERS  [facility] [address]  [address] [place], [place] [address] [phone]

## 2023-09-30 ENCOUNTER — Encounter: Payer: Self-pay | Admitting: Urology

## 2023-09-30 ENCOUNTER — Other Ambulatory Visit: Payer: Self-pay | Admitting: Family Medicine

## 2023-09-30 ENCOUNTER — Inpatient Hospital Stay
Admission: RE | Admit: 2023-09-30 | Discharge: 2023-09-30 | Disposition: A | Discharge status: Home / Self Care | Payer: Self-pay | Source: Ambulatory Visit | Attending: Physician Assistant | Admitting: Physician Assistant

## 2023-09-30 ENCOUNTER — Ambulatory Visit: Admitting: Physician Assistant

## 2023-09-30 ENCOUNTER — Ambulatory Visit: Admitting: Urology

## 2023-09-30 ENCOUNTER — Encounter: Payer: Self-pay | Admitting: Physician Assistant

## 2023-09-30 VITALS — BP 101/63 | HR 67 | Ht 74.0 in | Wt 130.0 lb

## 2023-09-30 VITALS — BP 134/74 | Ht 74.0 in | Wt 130.0 lb

## 2023-09-30 DIAGNOSIS — R519 Headache, unspecified: Secondary | ICD-10-CM

## 2023-09-30 DIAGNOSIS — N138 Other obstructive and reflux uropathy: Secondary | ICD-10-CM

## 2023-09-30 DIAGNOSIS — N411 Chronic prostatitis: Secondary | ICD-10-CM | POA: Diagnosis not present

## 2023-09-30 DIAGNOSIS — Z049 Encounter for examination and observation for unspecified reason: Secondary | ICD-10-CM

## 2023-09-30 DIAGNOSIS — R634 Abnormal weight loss: Secondary | ICD-10-CM

## 2023-09-30 DIAGNOSIS — R413 Other amnesia: Secondary | ICD-10-CM

## 2023-09-30 DIAGNOSIS — N401 Enlarged prostate with lower urinary tract symptoms: Secondary | ICD-10-CM

## 2023-09-30 DIAGNOSIS — M5481 Occipital neuralgia: Secondary | ICD-10-CM | POA: Diagnosis not present

## 2023-09-30 DIAGNOSIS — R935 Abnormal findings on diagnostic imaging of other abdominal regions, including retroperitoneum: Secondary | ICD-10-CM

## 2023-09-30 DIAGNOSIS — M542 Cervicalgia: Secondary | ICD-10-CM | POA: Diagnosis not present

## 2023-09-30 DIAGNOSIS — E291 Testicular hypofunction: Secondary | ICD-10-CM | POA: Diagnosis not present

## 2023-09-30 DIAGNOSIS — N529 Male erectile dysfunction, unspecified: Secondary | ICD-10-CM | POA: Diagnosis not present

## 2023-09-30 DIAGNOSIS — R6889 Other general symptoms and signs: Secondary | ICD-10-CM

## 2023-09-30 DIAGNOSIS — N3281 Overactive bladder: Secondary | ICD-10-CM | POA: Diagnosis not present

## 2023-09-30 MED ORDER — MIRABEGRON ER 50 MG PO TB24: 50.0000 mg | ORAL_TABLET | Freq: Every day | ORAL | 11 refills | Status: AC

## 2023-09-30 MED ORDER — TADALAFIL 5 MG PO TABS: 5.0000 mg | ORAL_TABLET | Freq: Every day | ORAL | 3 refills | Status: AC | PRN

## 2023-09-30 MED ORDER — DOXYCYCLINE HYCLATE 100 MG PO CAPS: 100.0000 mg | ORAL_CAPSULE | Freq: Two times a day (BID) | ORAL | 0 refills | Status: AC

## 2023-10-01 ENCOUNTER — Ambulatory Visit: Payer: Self-pay | Admitting: Urology

## 2023-10-01 LAB — HEMOGLOBIN AND HEMATOCRIT, BLOOD
Hematocrit: 41.8 % (ref 37.5–51.0)
Hemoglobin: 13.4 g/dL (ref 13.0–17.7)

## 2023-10-01 LAB — TESTOSTERONE: Testosterone: 422 ng/dL (ref 264–916)

## 2023-10-01 LAB — PSA: Prostate Specific Ag, Serum: 0.6 ng/mL (ref 0.0–4.0)

## 2023-10-20 ENCOUNTER — Ambulatory Visit: Admitting: Urology

## 2023-10-20 DIAGNOSIS — F1120 Opioid dependence, uncomplicated: Secondary | ICD-10-CM | POA: Diagnosis not present

## 2023-10-20 DIAGNOSIS — Z79891 Long term (current) use of opiate analgesic: Secondary | ICD-10-CM | POA: Diagnosis not present

## 2023-10-21 ENCOUNTER — Ambulatory Visit: Admitting: Nurse Practitioner

## 2023-10-28 ENCOUNTER — Ambulatory Visit: Admitting: Nurse Practitioner

## 2023-11-04 ENCOUNTER — Ambulatory Visit: Admitting: Nurse Practitioner

## 2023-11-17 DIAGNOSIS — M1990 Unspecified osteoarthritis, unspecified site: Secondary | ICD-10-CM | POA: Diagnosis not present

## 2023-11-17 DIAGNOSIS — M791 Myalgia, unspecified site: Secondary | ICD-10-CM | POA: Diagnosis not present

## 2023-11-17 DIAGNOSIS — M5416 Radiculopathy, lumbar region: Secondary | ICD-10-CM | POA: Diagnosis not present

## 2023-11-17 DIAGNOSIS — M545 Low back pain, unspecified: Secondary | ICD-10-CM | POA: Diagnosis not present

## 2023-11-17 DIAGNOSIS — M62830 Muscle spasm of back: Secondary | ICD-10-CM | POA: Diagnosis not present

## 2023-11-17 DIAGNOSIS — M961 Postlaminectomy syndrome, not elsewhere classified: Secondary | ICD-10-CM | POA: Diagnosis not present

## 2023-11-17 DIAGNOSIS — G894 Chronic pain syndrome: Secondary | ICD-10-CM | POA: Diagnosis not present

## 2023-11-17 DIAGNOSIS — M5011 Cervical disc disorder with radiculopathy,  high cervical region: Secondary | ICD-10-CM | POA: Diagnosis not present

## 2023-11-17 DIAGNOSIS — M5412 Radiculopathy, cervical region: Secondary | ICD-10-CM | POA: Diagnosis not present

## 2023-12-01 ENCOUNTER — Encounter: Payer: Self-pay | Admitting: Physician Assistant

## 2023-12-02 ENCOUNTER — Ambulatory Visit: Admitting: Nurse Practitioner

## 2023-12-04 ENCOUNTER — Encounter: Payer: Self-pay | Admitting: Nurse Practitioner

## 2023-12-04 ENCOUNTER — Ambulatory Visit (INDEPENDENT_AMBULATORY_CARE_PROVIDER_SITE_OTHER): Admitting: Nurse Practitioner

## 2023-12-04 ENCOUNTER — Ambulatory Visit
Admission: RE | Admit: 2023-12-04 | Discharge: 2023-12-04 | Disposition: A | Source: Ambulatory Visit | Attending: Physician Assistant | Admitting: Physician Assistant

## 2023-12-04 ENCOUNTER — Ambulatory Visit
Admission: RE | Admit: 2023-12-04 | Discharge: 2023-12-04 | Disposition: A | Discharge status: Home / Self Care | Source: Ambulatory Visit | Attending: Physician Assistant | Admitting: Physician Assistant

## 2023-12-04 VITALS — BP 116/74 | HR 96 | Temp 98.1°F | Ht 74.0 in | Wt 134.8 lb

## 2023-12-04 DIAGNOSIS — G8929 Other chronic pain: Secondary | ICD-10-CM | POA: Diagnosis not present

## 2023-12-04 DIAGNOSIS — E7800 Pure hypercholesterolemia, unspecified: Secondary | ICD-10-CM | POA: Diagnosis not present

## 2023-12-04 DIAGNOSIS — Z131 Encounter for screening for diabetes mellitus: Secondary | ICD-10-CM

## 2023-12-04 DIAGNOSIS — K7689 Other specified diseases of liver: Secondary | ICD-10-CM | POA: Diagnosis not present

## 2023-12-04 DIAGNOSIS — K219 Gastro-esophageal reflux disease without esophagitis: Secondary | ICD-10-CM | POA: Diagnosis not present

## 2023-12-04 DIAGNOSIS — R634 Abnormal weight loss: Secondary | ICD-10-CM

## 2023-12-04 DIAGNOSIS — R519 Headache, unspecified: Secondary | ICD-10-CM | POA: Diagnosis not present

## 2023-12-04 DIAGNOSIS — L719 Rosacea, unspecified: Secondary | ICD-10-CM

## 2023-12-04 DIAGNOSIS — F411 Generalized anxiety disorder: Secondary | ICD-10-CM | POA: Diagnosis not present

## 2023-12-04 DIAGNOSIS — L659 Nonscarring hair loss, unspecified: Secondary | ICD-10-CM | POA: Diagnosis not present

## 2023-12-04 DIAGNOSIS — R932 Abnormal findings on diagnostic imaging of liver and biliary tract: Secondary | ICD-10-CM | POA: Diagnosis not present

## 2023-12-04 DIAGNOSIS — R6889 Other general symptoms and signs: Secondary | ICD-10-CM

## 2023-12-04 DIAGNOSIS — L989 Disorder of the skin and subcutaneous tissue, unspecified: Secondary | ICD-10-CM | POA: Diagnosis not present

## 2023-12-04 DIAGNOSIS — K8689 Other specified diseases of pancreas: Secondary | ICD-10-CM | POA: Diagnosis not present

## 2023-12-04 MED ORDER — CALCIPOTRIENE-BETAMETH DIPROP 0.005-0.064 % EX OINT: TOPICAL_OINTMENT | Freq: Every day | CUTANEOUS | 0 refills | Status: AC

## 2023-12-04 MED ORDER — FINASTERIDE 1 MG PO TABS: 1.0000 mg | ORAL_TABLET | Freq: Every day | ORAL | 1 refills | Status: AC

## 2023-12-04 MED ORDER — GADOPICLENOL 0.5 MMOL/ML IV SOLN
7.5000 mL | Freq: Once | INTRAVENOUS | Status: AC | PRN
  Administered 2023-12-04: 7 mL via INTRAVENOUS

## 2023-12-04 MED ORDER — PANTOPRAZOLE SODIUM 40 MG PO TBEC: 40.0000 mg | DELAYED_RELEASE_TABLET | Freq: Every day | ORAL | 1 refills | Status: AC

## 2023-12-04 MED ORDER — ALPRAZOLAM 0.25 MG PO TABS: 0.2500 mg | ORAL_TABLET | Freq: Every evening | ORAL | 3 refills | Status: DC | PRN

## 2023-12-04 NOTE — Progress Notes (Unsigned)
 Established Patient Office Visit  Subjective:  Patient ID: Larry Lin, male    DOB: 12-28-1955  Age: 68 y.o. MRN: [id number]  CC:  Chief Complaint  Patient presents with   Follow-up   Discussed the use of AI scribe software for clinical note transcription with the patient, who gave verbal consent to proceed.  History of Present Illness   Discussed the use of AI scribe software for clinical note transcription with the patient, who gave verbal consent to proceed.  History of Present Illness  Discussed the use of AI scribe software for clinical note transcription with the patient, who gave verbal consent to proceed.  History of Present Illness  Discussed the use of AI scribe software for clinical note transcription with the patient, who gave verbal consent to proceed.  History of Present Illness   Larry Lin is a 68 year old male who presents for routine follow-up.  He has experienced urinary symptoms for over a year, including painful and infrequent urination and nausea when an infection is imminent. He has been off antibiotics for about three weeks.  He is followed by urology.  Patient has chronic neck pain and left shoulder pain by neurosurgery.  Have C3-C4 and C6-7 fusion.  An MRI was performed today following a CT scan in August.He is followed by pain management Dr. Darletta Bathe with through morphine since 2017, which is effective for back pain but not neck pain.   Persistent skin lesion on the left leg have been present for over a year, including one on his eyelid lasting six months.  He would like to be referred to dermatology for evaluation.  Patient would also need refill for Taclonex for eczema.   Patient takes Xanax  as needed for anxiety control.  He do not uses it consistently.    Past Medical History:  Diagnosis Date   Anemia    Arthritis    Cervical spondylolysis    Corticostriatal-spinal degeneration (HCC)    GERD (gastroesophageal  reflux disease)    Low testosterone      Past Surgical History:  Procedure Laterality Date   BACK SURGERY  2006   COLECTOMY  2000   COLONOSCOPY WITH PROPOFOL  N/A 12/04/2018   Procedure: COLONOSCOPY WITH PROPOFOL ;  Surgeon: Unk Corinn Skiff, MD;  Location: [facility] ENDOSCOPY;  Service: Gastroenterology;  Laterality: N/A;   diverticulitis surgery     removed some of lower intestine   ESOPHAGOGASTRODUODENOSCOPY (EGD) WITH PROPOFOL  N/A 12/04/2018   Procedure: ESOPHAGOGASTRODUODENOSCOPY (EGD) WITH PROPOFOL ;  Surgeon: Unk Corinn Skiff, MD;  Location: [facility] ENDOSCOPY;  Service: Gastroenterology;  Laterality: N/A;   NECK SURGERY  2015   VASECTOMY     only right side done for nerve pain    Family History  Problem Relation Age of Onset   Leukemia Mother    Healthy Father    Kidney disease Neg Hx    Prostate cancer Neg Hx     Social History   Socioeconomic History   Marital status: Divorced    Spouse name: Not on file   Number of children: Not on file   Years of education: Not on file   Highest education level: Bachelor's degree (e.g., BA, AB, BS)  Occupational History   Not on file  Tobacco Use   Smoking status: Former    Current packs/day: 0.00    Types: Cigarettes    Quit date: 01/22/1987    Years since quitting: 36.9    Passive exposure: Past   Smokeless  tobacco: Never  Vaping Use   Vaping status: Never Used  Substance and Sexual Activity   Alcohol use: Not Currently    Alcohol/week: 1.0 standard drink of alcohol    Types: 1 Cans of beer per week   Drug use: No   Sexual activity: Yes    Birth control/protection: Condom  Other Topics Concern   Not on file  Social History Narrative   Not on file   Social Drivers of Health   Financial Resource Strain: Medium Risk (12/03/2023)   Overall Financial Resource Strain (CARDIA)    Difficulty of Paying Living Expenses: Somewhat hard  Food Insecurity: No Food Insecurity (12/03/2023)   Hunger Vital Sign    Worried About  Running Out of Food in the Last Year: Never true    Ran Out of Food in the Last Year: Never true  Transportation Needs: Patient Declined (12/03/2023)   PRAPARE - Transportation    Lack of Transportation (Medical): Patient declined    Lack of Transportation (Non-Medical): Patient declined  Physical Activity: Insufficiently Active (12/03/2023)   Exercise Vital Sign    Days of Exercise per Week: 4 days    Minutes of Exercise per Session: 30 min  Stress: Stress Concern Present (12/03/2023)   [employer] of Occupational Health - Occupational Stress Questionnaire    Feeling of Stress: To some extent  Social Connections: Moderately Isolated (12/03/2023)   Social Connection and Isolation Panel    Frequency of Communication with Friends and Family: More than three times a week    Frequency of Social Gatherings with Friends and Family: Patient declined    Attends Religious Services: Patient declined    Database Administrator or Organizations: Yes    Attends Banker Meetings: 1 to 4 times per year    Marital Status: Divorced  Catering Manager Violence: Not on file     Outpatient Medications Prior to Visit  Medication Sig Dispense Refill   ascorbic acid (VITAMIN C) 100 MG tablet Take 1 tablet by mouth daily.     betamethasone  dipropionate 0.05 % cream Apply topically 2 (two) times daily. 30 g 2   doxycycline  (VIBRAMYCIN ) 100 MG capsule Take 1 capsule (100 mg total) by mouth every 12 (twelve) hours. 60 capsule 0   lidocaine  (LIDODERM ) 5 % Place 1 patch onto the skin daily.     metroNIDAZOLE  (METROGEL ) 1 % gel Apply topically daily. 45 g 1   mirabegron  ER (MYRBETRIQ ) 50 MG TB24 tablet Take 1 tablet (50 mg total) by mouth daily. 30 tablet 11   morphine (MS CONTIN) 30 MG 12 hr tablet Take 30 mg by mouth every 12 (twelve) hours.     Multiple Vitamins-Minerals (MENS 50+ MULTI VITAMIN/MIN PO) Take 1 tablet by mouth daily.     sodium chloride  1 g tablet Take 1 tablet (1 g total) by  mouth 3 (three) times daily with meals. 270 tablet 3   tadalafil  (CIALIS ) 5 MG tablet Take 1 tablet (5 mg total) by mouth daily as needed for erectile dysfunction. 90 tablet 3   Testosterone  20.25 MG/ACT (1.62%) GEL APPLY 4 PUMPS TO THE SKIN ONCE DAILY AS DIRECTED 300 g 5   VITAMIN D  PO Take 1 tablet by mouth 2 (two) times a week.     calcipotriene -betamethasone  (TACLONEX) ointment Apply topically daily. 60 g 0   ALPRAZolam  (XANAX ) 0.25 MG tablet Take 1 tablet (0.25 mg total) by mouth at bedtime as needed for anxiety. 30 tablet 3   finasteride  (  PROPECIA ) 1 MG tablet Take 1 tablet (1 mg total) by mouth daily. 90 tablet 1   No facility-administered medications prior to visit.    Allergies  Allergen Reactions   Septra  [Sulfamethoxazole -Trimethoprim ] Other (See Comments)    Felt spacey like he couldn't concentrate while taking the antibiotic     ROS Review of Systems Negative unless indicated in HPI.    Objective:    Physical Exam Constitutional:      Appearance: Normal appearance.  HENT:     Mouth/Throat:     Mouth: Mucous membranes are moist.  Eyes:     Conjunctiva/sclera: Conjunctivae normal.     Pupils: Pupils are equal, round, and reactive to light.  Cardiovascular:     Rate and Rhythm: Normal rate and regular rhythm.     Pulses: Normal pulses.     Heart sounds: Normal heart sounds.  Pulmonary:     Effort: Pulmonary effort is normal.     Breath sounds: Normal breath sounds.  Abdominal:     General: Bowel sounds are normal.     Palpations: Abdomen is soft.  Musculoskeletal:     Cervical back: Normal range of motion. No tenderness.  Skin:    General: Skin is warm.     Findings: Lesion (left leg) present. No bruising.  Neurological:     General: No focal deficit present.     Mental Status: He is alert and oriented to person, place, and time. Mental status is at baseline.  Psychiatric:        Mood and Affect: Mood normal.        Behavior: Behavior normal.         Thought Content: Thought content normal.        Judgment: Judgment normal.     BP 116/74   Pulse 96   Temp 98.1 F (36.7 C)   Ht 6' 2 (1.88 m)   Wt 134 lb 12.8 oz (61.1 kg)   SpO2 97%   BMI 17.31 kg/m  Wt Readings from Last 3 Encounters:  12/04/23 134 lb 12.8 oz (61.1 kg)  09/30/23 130 lb (59 kg)  09/30/23 130 lb (59 kg)     Health Maintenance  Topic Date Due   Pneumococcal Vaccine: 50+ Years (1 of 1 - PCV) Never done   Zoster Vaccines- Shingrix (1 of 2) Never done   Medicare Annual Wellness (AWV)  07/03/2019   COVID-19 Vaccine (2 - 2025-26 season) 09/22/2023   Influenza Vaccine  04/20/2024 (Originally 08/22/2023)   DTaP/Tdap/Td (1 - Tdap) 12/03/2024 (Originally 11/10/1984)   Colonoscopy  12/03/2028   Hepatitis C Screening  Completed   Meningococcal B Vaccine  Aged Out    There are no preventive care reminders to display for this patient.  Lab Results  Component Value Date   TSH 0.57 04/22/2023   Lab Results  Component Value Date   WBC 8.4 04/22/2023   HGB 13.4 09/30/2023   HCT 41.8 09/30/2023   MCV 90.3 04/22/2023   PLT 239.0 04/22/2023   Lab Results  Component Value Date   NA 139 12/04/2023   K 4.0 12/04/2023   CO2 31 12/04/2023   GLUCOSE 109 (H) 12/04/2023   BUN 27 (H) 12/04/2023   CREATININE 0.88 12/04/2023   BILITOT 0.7 12/04/2023   ALKPHOS 44 12/04/2023   AST 17 12/04/2023   ALT 13 12/04/2023   PROT 7.6 12/04/2023   ALBUMIN 4.8 12/04/2023   CALCIUM 9.7 12/04/2023   ANIONGAP 9 01/05/2020   GFR  89.22 12/04/2023   Lab Results  Component Value Date   CHOL 195 12/04/2023   Lab Results  Component Value Date   HDL 57.40 12/04/2023   Lab Results  Component Value Date   LDLCALC 112 (H) 12/04/2023   Lab Results  Component Value Date   TRIG 126.0 12/04/2023   Lab Results  Component Value Date   CHOLHDL 3 12/04/2023   Lab Results  Component Value Date   HGBA1C 5.2 12/04/2023      Assessment & Plan:   Assessment & Plan Screening  for diabetes mellitus  Orders:   HgB A1c  Elevated LDL cholesterol level Will check lipid panel. Orders:   Comp Met (CMET)   Lipid panel   TSH; Future  Skin lesion of left leg Lesions present for over a year, some resolving.  - Referred to dermatology for evaluation. Orders:   Ambulatory referral to Dermatology  Generalized anxiety disorder Managed with Xanax  as needed. Avoids regular use due to drowsiness. - Continue Xanax  once a day as needed.    Other chronic pain Chronic pain on Morphine followed by pain management Dr. Darletta Bathe.    Gastroesophageal reflux disease, unspecified whether esophagitis present Symptoms include pain with hot drinks and eating, affecting appetite. - Prescribed pantoprazole  before breakfast daily.    Patchy loss of hair On finasteride        Assessment and Plan Assessment & Plan  Assessment and Plan     Follow-up: Return in about 6 months (around 06/02/2024) for chronic management.   Chetan Mehring, NP

## 2023-12-04 NOTE — Assessment & Plan Note (Addendum)
 Will check lipid panel. Orders:   Comp Met (CMET)   Lipid panel   TSH; Future

## 2023-12-05 ENCOUNTER — Other Ambulatory Visit (HOSPITAL_COMMUNITY): Payer: Self-pay

## 2023-12-05 LAB — COMPREHENSIVE METABOLIC PANEL WITH GFR
ALT: 13 U/L (ref 0–53)
AST: 17 U/L (ref 0–37)
Albumin: 4.8 g/dL (ref 3.5–5.2)
Alkaline Phosphatase: 44 U/L (ref 39–117)
BUN: 27 mg/dL — ABNORMAL HIGH (ref 6–23)
CO2: 31 meq/L (ref 19–32)
Calcium: 9.7 mg/dL (ref 8.4–10.5)
Chloride: 100 meq/L (ref 96–112)
Creatinine, Ser: 0.88 mg/dL (ref 0.40–1.50)
GFR: 89.22 mL/min (ref >60.00)
Glucose, Bld: 109 mg/dL — ABNORMAL HIGH (ref 70–99)
Potassium: 4 meq/L (ref 3.5–5.1)
Sodium: 139 meq/L (ref 135–145)
Total Bilirubin: 0.7 mg/dL (ref 0.2–1.2)
Total Protein: 7.6 g/dL (ref 6.0–8.3)

## 2023-12-05 LAB — LIPID PANEL
Cholesterol: 195 mg/dL (ref 0–200)
HDL: 57.4 mg/dL (ref >39.00)
LDL Cholesterol: 112 mg/dL — ABNORMAL HIGH (ref 0–99)
NonHDL: 137.26
Total CHOL/HDL Ratio: 3
Triglycerides: 126 mg/dL (ref 0.0–149.0)
VLDL: 25.2 mg/dL (ref 0.0–40.0)

## 2023-12-05 LAB — HEMOGLOBIN A1C: Hgb A1c MFr Bld: 5.2 % (ref 4.6–6.5)

## 2023-12-08 ENCOUNTER — Telehealth: Payer: Self-pay

## 2023-12-08 ENCOUNTER — Other Ambulatory Visit (HOSPITAL_COMMUNITY): Payer: Self-pay

## 2023-12-08 NOTE — Telephone Encounter (Signed)
 Pharmacy Patient Advocate Encounter   Received notification from Onbase that prior authorization for ALPRAZolam  0.25MG  tablets is required/requested.   Insurance verification completed.   The patient is insured through [employer].   Per test claim: PA required; PA submitted to above mentioned insurance via Latent Key/confirmation #/EOC [facility] Status is pending

## 2023-12-08 NOTE — Telephone Encounter (Signed)
 Prior Authorization form/request asks a question that requires your assistance. Please see the question below and advise accordingly. The PA will not be submitted until the necessary information is received.  PLEASE BE ADVISED I WILL NEED THIS BEFORE PROCEEDING     Pharmacy Patient Advocate Encounter   Received notification from Onbase that prior authorization for calcipotriene -betamethasone  (TACLONEX) ointment  is required/requested.   Insurance verification completed.   The patient is insured through [employer].   Per test claim: PA required; PA submitted to above mentioned insurance via Latent Key/confirmation #/EOC [id number] Status is pending

## 2023-12-09 ENCOUNTER — Telehealth: Payer: Self-pay

## 2023-12-09 ENCOUNTER — Ambulatory Visit: Payer: Self-pay | Admitting: Physician Assistant

## 2023-12-09 NOTE — Telephone Encounter (Signed)
 Copied from CRM #[id number]. Topic: Clinical - Medication Question >> Dec 09, 2023  9:35 AM Suzen RAMAN wrote: Reason for CRM: [facility] shows patient is currently taking morphine (MS CONTIN) 30 MG 12 hr tablet and pharmacy called for additional approval for patient to take ALPRAZolam  (XANAX ) 0.25 MG tablet in addition to due to the potential risk  for overdose. Please contact pharmacy to advise    CB# [phone] Opt 0

## 2023-12-09 NOTE — Telephone Encounter (Signed)
 If you search medication and look when originally proscribed you will see that it was prescribed for Rosacea  L71.9 and is on patient problem list. Please investigate before sending to provider.

## 2023-12-09 NOTE — Telephone Encounter (Signed)
 Pharmacy Patient Advocate Encounter  Received notification from [employer] that Prior Authorization for ALPRAZolam  0.25MG  tablets  has been APPROVED from 12/09/23 to 01/20/25   PA #/Case ID/Reference #: [id number]

## 2023-12-10 ENCOUNTER — Other Ambulatory Visit: Payer: Self-pay | Admitting: Nurse Practitioner

## 2023-12-10 ENCOUNTER — Ambulatory Visit: Payer: Self-pay | Admitting: Nurse Practitioner

## 2023-12-10 DIAGNOSIS — G8929 Other chronic pain: Secondary | ICD-10-CM | POA: Insufficient documentation

## 2023-12-10 DIAGNOSIS — L989 Disorder of the skin and subcutaneous tissue, unspecified: Secondary | ICD-10-CM | POA: Insufficient documentation

## 2023-12-10 NOTE — Assessment & Plan Note (Addendum)
 Lesions present for over a year, some resolving.  - Referred to dermatology for evaluation. Orders:   Ambulatory referral to Dermatology

## 2023-12-10 NOTE — Progress Notes (Signed)
 Electrolytes, liver function and A1c normal LDL elevated. I recommend eating a heart healthy diet which includes lots of fruits and vegetables, fibers, and healthy fats (found in fish and certain oils) while limiting/avoiding red meats, butter, cheese, fried foods, and other foods with a lot of saturated fats. BUN elevated can be related to dehydration.  Increase fluid intake

## 2023-12-10 NOTE — Assessment & Plan Note (Signed)
 Managed with Xanax  as needed. Avoids regular use due to drowsiness. - Continue Xanax  once a day as needed.

## 2023-12-10 NOTE — Assessment & Plan Note (Signed)
 Symptoms include pain with hot drinks and eating, affecting appetite. - Prescribed pantoprazole  before breakfast daily.

## 2023-12-10 NOTE — Telephone Encounter (Signed)
 Called no answer so I sent a my chart message.

## 2023-12-10 NOTE — Assessment & Plan Note (Signed)
 Chronic pain on Morphine followed by pain management Dr. Darletta Bathe.

## 2023-12-10 NOTE — Assessment & Plan Note (Addendum)
 On finasteride

## 2023-12-11 ENCOUNTER — Other Ambulatory Visit: Payer: Self-pay | Admitting: Nurse Practitioner

## 2023-12-11 MED ORDER — ALPRAZOLAM 0.25 MG PO TABS: 0.2500 mg | ORAL_TABLET | Freq: Every evening | ORAL | 0 refills | Status: DC | PRN

## 2023-12-11 MED ORDER — ALPRAZOLAM 0.25 MG PO TABS: 0.2500 mg | ORAL_TABLET | Freq: Every evening | ORAL | 0 refills | Status: AC | PRN

## 2023-12-11 NOTE — Telephone Encounter (Signed)
 Late documentation: Called pharmacy yesterday, pt not taking Xanax  daily. Will cancel the refill to 30 days and will send refill for 15 days. Called pt to inform twice and pt not reachable voice message left to call the office.

## 2023-12-11 NOTE — Telephone Encounter (Signed)
 Pharmacy Patient Advocate Encounter  Received notification from [employer] that Prior Authorization for Calcipotriene -Betameth Diprop 0.005-0.064% ointment  has been DENIED.  See denial reason below. No denial letter attached in CMM. Will attach denial letter to Media tab once received.  PLEASE BE ADVISED  You asked for the drug above for your Rosacea, unspecified. This is an off-label use that is not medically accepted. The Medicare rule in the Prescription Drug Benefit Manual (Chapter 6, Section 10.6) says a drug must be used for a medically accepted indication (covered use). Off-label use is medically accepted when there is proof in one or more of the drug guides that the drug works for your condition. We look at the two major drug guides (compendia): the DRUGDEX Information System and the [facility] Formulary Service Drug Information (AHFS-DI). [employer] has decided that there is no proof in either drug guide that this drug works for your condition. Per Medicare rules, it is not covered.   PA #/Case ID/Reference #: [id number]

## 2023-12-15 NOTE — Telephone Encounter (Signed)
Left another message to call the office back.

## 2023-12-16 NOTE — Telephone Encounter (Signed)
 Late documentation: Called pharmacy yesterday, pt not taking Xanax  daily. Will cancel the refill to 30 days and will send refill for 15 days. Called pt to inform twice and pt not reachable voice message left to call the office.

## 2023-12-16 NOTE — Telephone Encounter (Signed)
 Left message to call the office back regarding multiple messages or to check his my chart.

## 2023-12-22 ENCOUNTER — Other Ambulatory Visit: Payer: Self-pay | Admitting: Nurse Practitioner

## 2023-12-22 MED ORDER — BETAMETHASONE DIPROPIONATE 0.05 % EX CREA: TOPICAL_CREAM | Freq: Two times a day (BID) | CUTANEOUS | 2 refills | Status: AC

## 2023-12-22 NOTE — Telephone Encounter (Signed)
 Called Patient to let him know that the Alprazolam  pa was approved but Charanpreet Kaur reduced it to 15 pills instead of 30 pills.

## 2023-12-22 NOTE — Telephone Encounter (Signed)
 Called Patient with PA denial and he wants to know if you can call in the Betamethasone  cream?

## 2023-12-22 NOTE — Telephone Encounter (Signed)
 Please inform patient betamethasone  cream has been sent to the pharmacy

## 2023-12-23 NOTE — Telephone Encounter (Signed)
 Called Patient to let him know that Charanpreet Kaur called in the betamethasone  cream.

## 2024-01-19 ENCOUNTER — Other Ambulatory Visit: Payer: Self-pay | Admitting: Urology

## 2024-01-19 DIAGNOSIS — E349 Endocrine disorder, unspecified: Secondary | ICD-10-CM

## 2024-03-16 ENCOUNTER — Ambulatory Visit: Admitting: Urology
